# Patient Record
Sex: Female | Born: 1955 | Race: White | Hispanic: No | Marital: Married | Smoking: Current every day smoker
Health system: Southern US, Community
[De-identification: ages and names within clinical notes are randomized; demographics above are authoritative.]

## PROBLEM LIST (undated history)

## (undated) DIAGNOSIS — C801 Malignant (primary) neoplasm, unspecified: Secondary | ICD-10-CM

## (undated) DIAGNOSIS — E785 Hyperlipidemia, unspecified: Secondary | ICD-10-CM

## (undated) DIAGNOSIS — B019 Varicella without complication: Secondary | ICD-10-CM

## (undated) DIAGNOSIS — E1169 Type 2 diabetes mellitus with other specified complication: Secondary | ICD-10-CM

## (undated) DIAGNOSIS — T4145XA Adverse effect of unspecified anesthetic, initial encounter: Secondary | ICD-10-CM

## (undated) DIAGNOSIS — T7840XA Allergy, unspecified, initial encounter: Secondary | ICD-10-CM

## (undated) DIAGNOSIS — R51 Headache: Secondary | ICD-10-CM

## (undated) DIAGNOSIS — Z720 Tobacco use: Secondary | ICD-10-CM

## (undated) DIAGNOSIS — T8859XA Other complications of anesthesia, initial encounter: Secondary | ICD-10-CM

## (undated) DIAGNOSIS — R519 Headache, unspecified: Secondary | ICD-10-CM

## (undated) DIAGNOSIS — M1990 Unspecified osteoarthritis, unspecified site: Secondary | ICD-10-CM

## (undated) DIAGNOSIS — N809 Endometriosis, unspecified: Secondary | ICD-10-CM

## (undated) DIAGNOSIS — R600 Localized edema: Secondary | ICD-10-CM

## (undated) DIAGNOSIS — F32A Depression, unspecified: Secondary | ICD-10-CM

## (undated) DIAGNOSIS — F329 Major depressive disorder, single episode, unspecified: Secondary | ICD-10-CM

## (undated) DIAGNOSIS — E119 Type 2 diabetes mellitus without complications: Secondary | ICD-10-CM

## (undated) HISTORY — PX: DIAGNOSTIC LAPAROSCOPY: SUR761

## (undated) HISTORY — PX: MOHS SURGERY: SUR867

## (undated) HISTORY — DX: Type 2 diabetes mellitus without complications: E11.9

## (undated) HISTORY — DX: Varicella without complication: B01.9

## (undated) HISTORY — DX: Depression, unspecified: F32.A

## (undated) HISTORY — PX: CYSTOSCOPY: SUR368

## (undated) HISTORY — DX: Tobacco use: Z72.0

## (undated) HISTORY — DX: Type 2 diabetes mellitus with other specified complication: E78.5

## (undated) HISTORY — DX: Allergy, unspecified, initial encounter: T78.40XA

## (undated) HISTORY — DX: Major depressive disorder, single episode, unspecified: F32.9

## (undated) HISTORY — DX: Unspecified osteoarthritis, unspecified site: M19.90

## (undated) HISTORY — PX: ABDOMINAL HYSTERECTOMY: SHX81

## (undated) HISTORY — PX: COSMETIC SURGERY: SHX468

## (undated) HISTORY — DX: Hyperlipidemia, unspecified: E11.69

---

## 1989-03-12 DIAGNOSIS — N369 Urethral disorder, unspecified: Secondary | ICD-10-CM | POA: Insufficient documentation

## 1989-03-12 DIAGNOSIS — Z87448 Personal history of other diseases of urinary system: Secondary | ICD-10-CM | POA: Insufficient documentation

## 1991-09-21 DIAGNOSIS — N3010 Interstitial cystitis (chronic) without hematuria: Secondary | ICD-10-CM | POA: Insufficient documentation

## 2005-12-03 ENCOUNTER — Ambulatory Visit: Payer: Self-pay | Admitting: Unknown Physician Specialty

## 2010-11-23 ENCOUNTER — Ambulatory Visit: Payer: Self-pay | Admitting: Family Medicine

## 2011-08-30 DIAGNOSIS — F321 Major depressive disorder, single episode, moderate: Secondary | ICD-10-CM | POA: Insufficient documentation

## 2011-08-30 DIAGNOSIS — E876 Hypokalemia: Secondary | ICD-10-CM | POA: Insufficient documentation

## 2011-08-30 DIAGNOSIS — R609 Edema, unspecified: Secondary | ICD-10-CM | POA: Insufficient documentation

## 2011-08-30 DIAGNOSIS — F32A Depression, unspecified: Secondary | ICD-10-CM | POA: Insufficient documentation

## 2011-10-11 ENCOUNTER — Ambulatory Visit: Payer: Self-pay | Admitting: Family Medicine

## 2011-10-17 ENCOUNTER — Ambulatory Visit: Payer: Self-pay | Admitting: Family Medicine

## 2012-10-09 DIAGNOSIS — E119 Type 2 diabetes mellitus without complications: Secondary | ICD-10-CM | POA: Insufficient documentation

## 2014-02-15 ENCOUNTER — Ambulatory Visit: Payer: Self-pay | Admitting: Internal Medicine

## 2014-03-18 ENCOUNTER — Ambulatory Visit: Payer: Self-pay | Admitting: Internal Medicine

## 2014-04-29 ENCOUNTER — Ambulatory Visit: Payer: Self-pay | Admitting: Internal Medicine

## 2014-06-17 ENCOUNTER — Encounter: Payer: Self-pay | Admitting: Internal Medicine

## 2014-06-17 ENCOUNTER — Ambulatory Visit (INDEPENDENT_AMBULATORY_CARE_PROVIDER_SITE_OTHER): Payer: BC Managed Care – PPO | Admitting: Internal Medicine

## 2014-06-17 ENCOUNTER — Encounter (INDEPENDENT_AMBULATORY_CARE_PROVIDER_SITE_OTHER): Payer: Self-pay

## 2014-06-17 VITALS — BP 128/86 | HR 96 | Temp 98.2°F | Resp 16 | Ht 61.0 in | Wt 160.5 lb

## 2014-06-17 DIAGNOSIS — R110 Nausea: Secondary | ICD-10-CM

## 2014-06-17 DIAGNOSIS — G4726 Circadian rhythm sleep disorder, shift work type: Secondary | ICD-10-CM

## 2014-06-17 DIAGNOSIS — E119 Type 2 diabetes mellitus without complications: Secondary | ICD-10-CM

## 2014-06-17 DIAGNOSIS — R5383 Other fatigue: Secondary | ICD-10-CM

## 2014-06-17 DIAGNOSIS — R1013 Epigastric pain: Secondary | ICD-10-CM

## 2014-06-17 LAB — COMPREHENSIVE METABOLIC PANEL
ALT: 21 U/L (ref 0–35)
AST: 21 U/L (ref 0–37)
Albumin: 4.1 g/dL (ref 3.5–5.2)
Alkaline Phosphatase: 118 U/L — ABNORMAL HIGH (ref 39–117)
BUN: 13 mg/dL (ref 6–23)
CO2: 31 mEq/L (ref 19–32)
Calcium: 10.1 mg/dL (ref 8.4–10.5)
Chloride: 87 mEq/L — ABNORMAL LOW (ref 96–112)
Creatinine, Ser: 0.9 mg/dL (ref 0.4–1.2)
GFR: 67.96 mL/min (ref >60.00)
Glucose, Bld: 93 mg/dL (ref 70–99)
Potassium: 3.1 mEq/L — ABNORMAL LOW (ref 3.5–5.1)
Sodium: 131 mEq/L — ABNORMAL LOW (ref 135–145)
Total Bilirubin: 0.9 mg/dL (ref 0.2–1.2)
Total Protein: 8.3 g/dL (ref 6.0–8.3)

## 2014-06-17 LAB — LIPID PANEL
Cholesterol: 277 mg/dL — ABNORMAL HIGH (ref 0–200)
HDL: 42 mg/dL (ref >39.00)
LDL Cholesterol: 199 mg/dL — ABNORMAL HIGH (ref 0–99)
NonHDL: 235
Total CHOL/HDL Ratio: 7
Triglycerides: 181 mg/dL — ABNORMAL HIGH (ref 0.0–149.0)
VLDL: 36.2 mg/dL (ref 0.0–40.0)

## 2014-06-17 LAB — MICROALBUMIN / CREATININE URINE RATIO
Creatinine,U: 12 mg/dL
Microalb Creat Ratio: 5.8 mg/g (ref 0.0–30.0)
Microalb, Ur: 0.7 mg/dL (ref 0.0–1.9)

## 2014-06-17 LAB — H. PYLORI ANTIBODY, IGG: H Pylori IgG: NEGATIVE

## 2014-06-17 LAB — LIPASE: Lipase: 93 U/L — ABNORMAL HIGH (ref 11.0–59.0)

## 2014-06-17 LAB — TSH: TSH: 1.54 u[IU]/mL (ref 0.35–4.50)

## 2014-06-17 LAB — HEMOGLOBIN A1C: Hgb A1c MFr Bld: 6.1 % (ref 4.6–6.5)

## 2014-06-17 MED ORDER — GLIPIZIDE 5 MG PO TABS: 5.0000 mg | ORAL_TABLET | Freq: Two times a day (BID) | ORAL | Status: DC

## 2014-06-17 MED ORDER — ONDANSETRON 4 MG PO TBDP: 4.0000 mg | ORAL_TABLET | Freq: Three times a day (TID) | ORAL | Status: DC | PRN

## 2014-06-17 MED ORDER — PROMETHAZINE HCL 25 MG/ML IJ SOLN
25.0000 mg | Freq: Once | INTRAMUSCULAR | Status: AC
  Administered 2014-06-17: 25 mg via INTRAMUSCULAR

## 2014-06-17 NOTE — Patient Instructions (Addendum)
Stop the Invokkana Met and the [employer].  DO NOT START GLIPIZIDE UNLESS YOUR BLOOD SUGAR S ARE > 150  This is  my version of a  "Low GI"  Diet:     All of the foods can be found at grocery stores and in bulk at [employer].  The Atkins protein bars and shakes are available in more varieties at Target, [employer] and [place].     7 AM Breakfast:  Choose from the following:  Low carbohydrate Protein  Shakes (I recommend the EAS AdvantEdge "Carb Control" shakes  Or the low carb shakes by Atkins.    2.5 carbs   Arnold's "Sandwhich Thin"toasted  w/ peanut butter (no jelly: about 20 net carbs  "Bagel Thin" with cream cheese and salmon: about 20 carbs   a scrambled egg/bacon/cheese burrito made with Mission's "carb balance" whole wheat tortilla  (about 10 net carbs )  A slice of home made fritatta (egg based dish without a crust:  google it)    Avoid cereal and bananas, oatmeal and cream of wheat and grits. They are loaded with carbohydrates!   10 AM: high protein snack  Protein bar by Atkins (the snack size, under 200 cal, usually < 6 net carbs).    A stick of cheese:  Around 1 carb,  100 cal     Dannon Light n Fit [place] Yogurt  (80 cal, 8 carbs)  Other so called "protein bars" and Greek yogurts tend to be loaded with carbohydrates.  Remember, in food advertising, the word "energy" is synonymous for " carbohydrate."  Lunch:   A Sandwich using the bread choices listed, Can use any  Eggs,  lunchmeat, grilled meat or canned tuna), avocado, regular mayo/mustard  and cheese.  A Salad using blue cheese, ranch,  Goddess or vinagrette,  No croutons or "confetti" and no "candied nuts" but regular nuts OK.   No pretzels or chips.  Pickles and miniature sweet peppers are a good low carb alternative that provide a "crunch"  The bread is the only source of carbohydrate in a sandwich and  can be decreased by trying some of these alternatives to traditional loaf bread  Joseph's makes a pita bread and a flat  bread that are 50 cal and 4 net carbs available at [place] and [place].  This can be toasted to use with hummous as well  Toufayan makes a low carb flatbread that's 100 cal and 9 net carbs available at [employer] and BJ's makes 2 sizes of  Low carb whole wheat tortilla  (The large one is 210 cal and 6 net carbs)  Flat Out makes flatbreads that are low carb as well  Avoid "Low fat dressings, as well as Barry Brunner and [place] dressings They are loaded with sugar!   3 PM/ Mid day  Snack:  Consider  1 ounce of  almonds, walnuts, pistachios, pecans, peanuts,  Macadamia nuts or a nut medley.  Avoid "granola"; the dried cranberries and raisins are loaded with carbohydrates. Mixed nuts as long as there are no raisins,  cranberries or dried fruit.    Try the prosciutto/mozzarella cheese sticks by Fiorruci  In deli /backery section   High protein   To avoid overindulging in snacks: Try drinking a glass of unsweeted almond/coconut milk  Or a cup of coffee with your Atkins chocolate bar to keep you from having 3!!!   Pork rinds!  Yes Pork Rinds  6 PM  Dinner:     Meat/fowl/fish with a green salad, and either broccoli, cauliflower, green beans, spinach, brussel sprouts or  Lima beans. DO NOT BREAD THE PROTEIN!!      There is a low carb pasta by Dreamfield's that is acceptable and tastes great: only 5 digestible carbs/serving.( All grocery stores but BJs carry it )  Try Hurley Cisco Angelo's chicken piccata or chicken or eggplant parm over low carb pasta.(Lowes and BJs)   Marjory Lies Sanchez's "Carnitas" (pulled pork, no sauce,  0 carbs) or his beef pot roast to make a dinner burrito (at BJ's)  Pesto over low carb pasta (bj's sells a good quality pesto in the center refrigerated section of the deli   Try satueeing  Cheral Marker with mushroooms  Whole wheat pasta is still full of digestible carbs and  Not as low in glycemic index as Dreamfield's.   Brown rice is still rice,  So skip the rice and noodles  if you eat [place] or [place] (or at least limit to 1/2 cup)  9 PM snack :   Breyer's "low carb" fudgsicle or  ice cream bar (Carb Smart line), or  Weight Watcher's ice cream bar , or another "no sugar added" ice cream;  a serving of fresh berries/cherries with whipped cream   Cheese or DANNON'S LlGHT N FIT GREEK YOGURT or the [place]os greek yogurt   8 ounces of [place] unsweetened almond/cococunut milk  Cheese and crackers (using WASA crackers,  They are low carb) or peanut butter on low carb crackers or pita bread     Avoid bananas, pineapple, grapes  and watermelon on a regular basis because they are high in sugar.  THINK OF THEM AS DESSERT  Remember that snack Substitutions should be less than 10 NET carbs per serving and meals should be < 25 net carbs. Remember that carbohydrates from fiber do not affect blood sugar, so you can  subtract fiber grams to get the "net carbs " of any particular food item.

## 2014-06-17 NOTE — Progress Notes (Signed)
Patient ID: Kristie Jones, female   DOB: July 05, 1956, 58 y.o.   MRN: [id number]   Patient Active Problem List   Diagnosis Date Noted  . Diabetes mellitus type 2, controlled 06/20/2014  . Nausea without vomiting 06/20/2014  . Sleep disorder, circadian, shift work type 06/20/2014    Subjective:  CC:   Chief Complaint  Patient presents with  . Establish Care  . Diabetes    HPI:   Kristie Jones a 58 y.o. female who presents to establish primary care and to address several issues.   New onset diabetes with significant weight loss, unintentional, coinciding with dx and medicatio nchanges   Diagnosed  with a1c of 7.0  Random  cbg of 156.     198 lbs at diagnosis.  Has Cut out refined sugar ,  Along with most starches .  Started havig "reactions"  To artificial sweeteners:  Vomiting .  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  Previous MD was appealing for a change in work for her , has been unable to have job changed at [employer] so she is actively looking for another job  .   Mon Tues Wed:  Work 6:30 PM to 7 Am   First break 15 min 10 PM Has 62  Min break at 12:30,  3rd break 4 am   Home by 7:30 am watches grandkids until 4:30 Pm has a 3 hour nap   Registered sleep technologist   Takes 2 diuretics and lisinopril , lasix prescribed by nephrology,  Has not been seen in a year.   Cystoscopy done to evaluate hematuria,  No cancer,  LE edema,  Wears compression stockings   History of scarring on bladder,  Endometriosis      Past Medical History  Diagnosis Date  . Arthritis   . Chicken pox   . Depression   . Diabetes mellitus without complication   . Allergy        @ALL @  Past Surgical History  Procedure Laterality Date  . Abdominal hysterectomy       History   Social History  . Marital Status: Married    Spouse Name: N/A    Number of Children: N/A  . Years of Education: N/A   Occupational History  . Not on file.   Social History Main Topics  . Smoking status: Current Every Day Smoker -- 0.50 packs/day    Types: Cigarettes  . Smokeless tobacco: Not on file  . Alcohol Use: 0.0 oz/week    0 Not specified per week     Comment: Rarely  . Drug Use: No  . Sexual Activity: Yes   Other Topics Concern  . Not on file   Social History Narrative  . No narrative on file    @FAMH @       Review of Systems:   The rest of the review of systems was negative except those addressed in the HPI.      Objective:  BP 128/86 mmHg  Pulse 96  Temp(Src) 98.2 F (36.8 C) (Oral)  Resp 16  Ht 5' 1"  (1.549 m)  Wt 160 lb 8 oz (72.802 kg)  BMI 30.34 kg/m2  SpO2 97%  General appearance: alert, cooperative and  appears stated age Ears: normal TM's and external ear canals both ears Throat: lips, mucosa, and tongue normal; teeth and gums normal Neck: no adenopathy, no carotid bruit, supple, symmetrical, trachea midline and thyroid not enlarged, symmetric, no tenderness/mass/nodules Back: symmetric, no curvature. ROM normal. No CVA tenderness. Lungs: clear to auscultation bilaterally Heart: regular rate and rhythm, S1, S2 normal, no murmur, click, rub or gallop Abdomen: soft, non-tender; bowel sounds normal; no masses,  no organomegaly Pulses: 2+ and symmetric Skin: Skin color, texture, turgor normal. No rashes or lesions Lymph nodes: Cervical, supraclavicular, and axillary nodes normal.  Assessment and Plan:  Diabetes mellitus type 2, controlled well-controlled on current medications.  hemoglobin AC  Is significantly  less than 7.0 . Patient is not  up-to-date on eye exams and foot exam is normal today. Patient  Has no urine microalbuminuria . Patient is tolerating statin therapy for CAD risk reduction and on ACE/ARB for  reduction in proteinuria.  Stpping Victoza and Invokkan Met due to patient intolerance of both     Lab Results  Component Value Date   HGBA1C 6.1 06/17/2014   Lab Results  Component Value Date   MICROALBUR 0.7 06/17/2014     Nausea without vomiting Presumed to be medications related.   victoza and invokannamet   Sleep disorder, circadian, shift work type She is desparately looking for an alternative woek situation,  Supported by her former Engineer, drilling.   A total of 60 minutes was spent with patient more than half of which was spent in counseling patient on the above mentioned issues , reviewing and explaining recent labs and imaging studies done, and coordination of care.  Updated Medication List Outpatient Encounter Prescriptions as of 06/17/2014  Medication Sig  . Canagliflozin-Metformin HCl [phone] MG TABS Take 1 tablet by mouth 2 (two) times daily.  . diazepam (VALIUM) 10 MG tablet Take 1 tablet by mouth 3 (three) times daily as needed.  . furosemide (LASIX) 20 MG tablet Take 1 tablet by mouth daily.  Marland Kitchen glipiZIDE (GLUCOTROL) 5 MG tablet Take 1 tablet (5 mg total) by mouth 2 (two) times daily before a meal.  . glucose blood (CVS BLOOD GLUCOSE TEST STRIPS) test strip Check fasting blood sugar daily ONE TOUCH ULTRA STRP BLUE  . Lancet Devices (CVS LANCING DEVICE) MISC Check blood sugar once daily.  250.00  . modafinil (PROVIGIL) 200 MG tablet Take 1.5 tablets by mouth daily.  . ondansetron (ZOFRAN-ODT) 4 MG disintegrating tablet Take 1 tablet (4 mg total) by mouth every 8 (eight) hours as needed for nausea or vomiting.  Marland Kitchen spironolactone (ALDACTONE) 100 MG tablet Take 1 tablet by mouth daily.  . [EXPIRED] promethazine (PHENERGAN) injection 25 mg      Orders Placed This Encounter  Procedures  . Hemoglobin A1c  . Lipid panel  . Microalbumin / creatinine urine ratio  . Comprehensive metabolic panel  . TSH  . Hepatitis C antibody  . Lipase  . H. pylori antibody, IgG  .  Ambulatory referral to Ophthalmology    No Follow-up on file.

## 2014-06-18 ENCOUNTER — Telehealth: Payer: Self-pay | Admitting: Internal Medicine

## 2014-06-18 LAB — HEPATITIS C ANTIBODY: HCV Ab: NEGATIVE

## 2014-06-18 NOTE — Telephone Encounter (Signed)
Patient called and ask to please dis- regard comments made by previous MD on medical record stated they are not true.  Patient did not relay as to the specific comments. Patient also ask for name of web site for support stockings.

## 2014-06-18 NOTE — Telephone Encounter (Signed)
No worries  I may my own conclusions,    [email or web address] in the website

## 2014-06-18 NOTE — Telephone Encounter (Signed)
Patient notified

## 2014-06-20 DIAGNOSIS — E1140 Type 2 diabetes mellitus with diabetic neuropathy, unspecified: Secondary | ICD-10-CM | POA: Insufficient documentation

## 2014-06-20 DIAGNOSIS — R110 Nausea: Secondary | ICD-10-CM | POA: Insufficient documentation

## 2014-06-20 DIAGNOSIS — G4726 Circadian rhythm sleep disorder, shift work type: Secondary | ICD-10-CM | POA: Insufficient documentation

## 2014-06-20 DIAGNOSIS — E119 Type 2 diabetes mellitus without complications: Secondary | ICD-10-CM | POA: Insufficient documentation

## 2014-06-20 NOTE — Assessment & Plan Note (Addendum)
well-controlled on current medications.  hemoglobin AC  Is significantly  less than 7.0 . Patient is not  up-to-date on eye exams and foot exam is normal today. Patient  Has no urine microalbuminuria . Patient is tolerating statin therapy for CAD risk reduction and on ACE/ARB for reduction in proteinuria.  Stpping Victoza and Invokkan Met due to patient intolerance of both     Lab Results  Component Value Date   HGBA1C 6.1 06/17/2014   Lab Results  Component Value Date   MICROALBUR 0.7 06/17/2014

## 2014-06-20 NOTE — Assessment & Plan Note (Signed)
She is desparately looking for an alternative woek situation,  Supported by her former physician.

## 2014-06-20 NOTE — Assessment & Plan Note (Signed)
Presumed to be medications related.   victoza and invokannamet

## 2014-06-23 ENCOUNTER — Telehealth: Payer: Self-pay | Admitting: Internal Medicine

## 2014-06-23 NOTE — Telephone Encounter (Signed)
emmi mailed  °

## 2014-09-16 ENCOUNTER — Encounter: Payer: Self-pay | Admitting: Internal Medicine

## 2014-09-16 ENCOUNTER — Encounter (INDEPENDENT_AMBULATORY_CARE_PROVIDER_SITE_OTHER): Payer: Self-pay

## 2014-09-16 ENCOUNTER — Ambulatory Visit (INDEPENDENT_AMBULATORY_CARE_PROVIDER_SITE_OTHER): Payer: BLUE CROSS/BLUE SHIELD | Admitting: Internal Medicine

## 2014-09-16 VITALS — BP 118/68 | HR 83 | Temp 97.8°F | Resp 14 | Ht 61.0 in | Wt 164.2 lb

## 2014-09-16 DIAGNOSIS — M25511 Pain in right shoulder: Secondary | ICD-10-CM

## 2014-09-16 DIAGNOSIS — E669 Obesity, unspecified: Secondary | ICD-10-CM

## 2014-09-16 DIAGNOSIS — E119 Type 2 diabetes mellitus without complications: Secondary | ICD-10-CM

## 2014-09-16 DIAGNOSIS — M25519 Pain in unspecified shoulder: Secondary | ICD-10-CM | POA: Insufficient documentation

## 2014-09-16 MED ORDER — METAXALONE 800 MG PO TABS: 800.0000 mg | ORAL_TABLET | Freq: Three times a day (TID) | ORAL | Status: DC

## 2014-09-16 NOTE — Progress Notes (Signed)
Pre-visit discussion using our clinic review tool. No additional management support is needed unless otherwise documented below in the visit note.  

## 2014-09-16 NOTE — Progress Notes (Signed)
Patient ID: Kristie Jones, female   DOB: 11/21/1955, 59 y.o.   MRN: [id number]    Patient Active Problem List   Diagnosis Date Noted  . Obesity 09/18/2014  . Pain in joint, shoulder region 09/16/2014  . Diabetes mellitus type 2, controlled 06/20/2014  . Nausea without vomiting 06/20/2014  . Sleep disorder, circadian, shift work type 06/20/2014    Subjective:  CC:   Chief Complaint  Patient presents with  . Follow-up    right wrist pain radiates up arm and into shoulder to neck. Patient rates pain 10 on scale oof 0-10.Patient staed she has had a couple of fall on the right side.  . Diabetes    HPI:   Kristie Jones is a 59 y.o. female who presents for   Follow up on multiple issues,  Including Type 2 DM, shift work related sleep disorder,  Hypertension and obesity.    Cc: right arm pain.  She reports that her right arm has been throbbing for several months and is aggravated by rain and by use of the arm.  The pain has radiated from shoulder to elbow.  currently her pain involves the wrist and radiates all the way up to the neck,  Throbbing in the upper arm.  NO NUMBNESS OR TINGLING.   using biofreeze which helps somewhat .  NSAIDS/ CONTRAINIDICATED DUE TO CKD AND EDEMA .  The arm started  Hurting in November after a period of increased physical activity secondary to caring for ailing mother .  Patient has no recent history of trauma  but She has had several falls , her most recent in 2013. She has a history of fibromyalgia diagnosed in 2007 at [facility].  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  All passive ROM hurts,  And the upper arm is painful to palpation.  Patient appears to be in moderate pain today.     Past Medical History  Diagnosis Date  . Arthritis   . Chicken pox   . Depression   . Diabetes mellitus without complication   . Allergy     Past Surgical History  Procedure Laterality Date  . Abdominal  hysterectomy         The following portions of the patient's history were reviewed and updated as appropriate: Allergies, current medications, and problem list.    Review of Systems:   Patient denies headache, fevers, malaise, unintentional weight loss, skin rash, eye pain, sinus congestion and sinus pain, sore throat, dysphagia,  hemoptysis , cough, dyspnea, wheezing, chest pain, palpitations, orthopnea, edema, abdominal pain, nausea, melena, diarrhea, constipation, flank pain, dysuria, hematuria, urinary  Frequency, nocturia, numbness, tingling, seizures,  Focal weakness, Loss of consciousness,  Tremor, insomnia, depression, anxiety, and suicidal ideation.     History   Social History  . Marital Status: Married    Spouse Name: N/A  . Number of Children: N/A  . Years of Education: N/A   Occupational History  . Not on file.   Social History Main Topics  . Smoking status: Current Every Day Smoker -- 0.50 packs/day    Types: Cigarettes  . Smokeless tobacco: Not on file  . Alcohol Use: 0.0 oz/week    0 Standard drinks or equivalent per week     Comment: Rarely  . Drug Use: No  . Sexual Activity: Yes   Other Topics Concern  . Not on file   Social  History Narrative    Objective:  Filed Vitals:   09/16/14 1132  BP: 118/68  Pulse: 83  Temp: 97.8 F (36.6 C)  Resp: 14     General appearance: alert, cooperative and appears stated age Back: symmetric, no curvature. ROM normal. No CVA tenderness. Lungs: clear to auscultation bilaterally Heart: regular rate and rhythm, S1, S2 normal, no murmur, click, rub or gallop Abdomen: soft, non-tender; bowel sounds normal; no masses,  no organomegaly Pulses: 2+ and symmetric Skin: Skin color, texture, turgor normal. No rashes or lesions Lymph nodes: Cervical, supraclavicular, and axillary nodes normal. MSKL right shoulder is painful with all passive ROM.  Deltoid bicep and triceps MM are tender to palpation. Distal strength is  4/5 secondary to increased pain with grip and resistance.   Assessment and Plan:  Pain in joint, shoulder region Her pain is diffuse and spans all ROM , most prounounced in upper arm and shoulder but also involves the wrist .  No neck involvement and nothing on exam to suggest CTS.  Adhesive capsulitis vs bursitis of shoulder suspected.  She refuses prednisone taper and has a C/I to NSAIDs due to CKD.  Muscle relaxer  Ordered, and referral to [facility]    Diabetes mellitus type 2, controlled Historically well-controlled on current medications.  hemoglobin AC  Is significantly  less than 7.0 . Patient is not  up-to-date on eye exams and foot exam is normal today. Patient  Has no urine microalbuminuria . Patient is tolerating statin therapy for CAD risk reduction and on ACE/ARB for reduction in proteinuria.  At her last visit all medications including  Victoza and Invokkana Met were stopped due to patient intolerance of both .  She will return next week for A1c   Lab Results  Component Value Date   HGBA1C 6.1 06/17/2014   Lab Results  Component Value Date   MICROALBUR 0.7 06/17/2014        Obesity I have addressed  BMI and recommended wt loss of 10% of body weigh over the next 6 months using a low glycemic index diet and regular exercise a minimum of 5 days per week.      Updated Medication List Outpatient Encounter Prescriptions as of 09/16/2014  Medication Sig  . diazepam (VALIUM) 10 MG tablet Take 1 tablet by mouth 3 (three) times daily as needed.  . furosemide (LASIX) 20 MG tablet Take 1 tablet by mouth daily.  Marland Kitchen glipiZIDE (GLUCOTROL) 5 MG tablet Take 1 tablet (5 mg total) by mouth 2 (two) times daily before a meal.  . glucose blood (CVS BLOOD GLUCOSE TEST STRIPS) test strip Check fasting blood sugar daily ONE TOUCH ULTRA STRP BLUE  . Lancet Devices (CVS LANCING DEVICE) MISC Check blood sugar once daily.  250.00  . Menthol, Topical Analgesic, (BIOFREEZE EX) Apply 1  application topically every 4 (four) hours as needed.  . modafinil (PROVIGIL) 200 MG tablet Take 1.5 tablets by mouth daily.  . Multiple Vitamins-Minerals (CENTRUM SILVER ADULT 50+ PO) Take 1 tablet by mouth daily.  Marland Kitchen spironolactone (ALDACTONE) 100 MG tablet Take 1 tablet by mouth daily.  . Canagliflozin-Metformin HCl [phone] MG TABS Take 1 tablet by mouth 2 (two) times daily.  . metaxalone (SKELAXIN) 800 MG tablet Take 1 tablet (800 mg total) by mouth 3 (three) times daily.  . ondansetron (ZOFRAN-ODT) 4 MG disintegrating tablet Take 1 tablet (4 mg total) by mouth every 8 (eight) hours as needed for nausea or vomiting. (Patient not taking: Reported on  09/16/2014)     Orders Placed This Encounter  Procedures  . Ambulatory referral to Orthopedic Surgery    No Follow-up on file.

## 2014-09-16 NOTE — Patient Instructions (Signed)
I have prescribed skelaxin for your arm pain and referred you to Dr Cindi Carbon for probable adhesive capsulitis  Return on or after Feb 12 for your fasting labs.

## 2014-09-18 ENCOUNTER — Encounter: Payer: Self-pay | Admitting: Internal Medicine

## 2014-09-18 DIAGNOSIS — E669 Obesity, unspecified: Secondary | ICD-10-CM | POA: Insufficient documentation

## 2014-09-18 NOTE — Assessment & Plan Note (Signed)
I have addressed  BMI and recommended wt loss of 10% of body weigh over the next 6 months using a low glycemic index diet and regular exercise a minimum of 5 days per week.   

## 2014-09-18 NOTE — Assessment & Plan Note (Addendum)
Historically well-controlled on current medications.  hemoglobin AC  Is significantly  less than 7.0 . Patient is not  up-to-date on eye exams and foot exam is normal today. Patient  Has no urine microalbuminuria . Patient is tolerating statin therapy for CAD risk reduction and on ACE/ARB for reduction in proteinuria.  At her last visit all medications including  Victoza and Invokkana Met were stopped due to patient intolerance of both .  She will return next week for A1c   Lab Results  Component Value Date   HGBA1C 6.1 06/17/2014   Lab Results  Component Value Date   MICROALBUR 0.7 06/17/2014      

## 2014-09-18 NOTE — Assessment & Plan Note (Addendum)
Her pain is diffuse and spans all ROM , most prounounced in upper arm and shoulder but also involves the wrist .  No neck involvement and nothing on exam to suggest CTS.  Adhesive capsulitis vs bursitis of shoulder suspected.  She refuses prednisone taper and has a C/I to NSAIDs due to CKD.  Muscle relaxer  Ordered, and referral to [employer] 

## 2014-09-18 NOTE — Addendum Note (Signed)
Addended by: Crecencio Mc on: 09/18/2014 07:40 PM   Modules accepted: Orders

## 2014-12-30 ENCOUNTER — Telehealth: Payer: Self-pay

## 2014-12-30 NOTE — Telephone Encounter (Signed)
The pt called hoping to discuss her depressions symptoms with Dr.[name] or Juliann Pulse.  Callback - [phone]

## 2014-12-31 ENCOUNTER — Other Ambulatory Visit: Payer: Self-pay | Admitting: Internal Medicine

## 2014-12-31 ENCOUNTER — Telehealth: Payer: Self-pay | Admitting: Internal Medicine

## 2014-12-31 MED ORDER — DIAZEPAM 10 MG PO TABS: 10.0000 mg | ORAL_TABLET | Freq: Every day | ORAL | Status: DC

## 2014-12-31 NOTE — Telephone Encounter (Signed)
Called patient family member answered phone and stated patient in shower could not come to phone.

## 2014-12-31 NOTE — Telephone Encounter (Signed)
Left message for patient to return call to office. 

## 2014-12-31 NOTE — Telephone Encounter (Signed)
See note on 12/31/14

## 2014-12-31 NOTE — Telephone Encounter (Signed)
Pt called to talk to [facility]. I then asked her for information so I could leave a note. Pt hung up in frustration, after talking about phone options and why she couldn't choose to talk to [facility]. I was not able to verify a contact number for call back.  12/31/14 maf

## 2014-12-31 NOTE — Telephone Encounter (Signed)
Kristie Jones, can you please try to call her back to talk to her.  Thanks

## 2014-12-31 NOTE — Telephone Encounter (Signed)
Refill faxed and patient is scheduled for Monday 01/10/15 due to patient will be out of town next week.

## 2014-12-31 NOTE — Telephone Encounter (Signed)
Refill on valium once daily approved and signed please offer patient an appt with me next week

## 2014-12-31 NOTE — Telephone Encounter (Signed)
Pt has called back wishing to talk to [facility]
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
## 2014-12-31 NOTE — Progress Notes (Signed)
Script faxed patient notified. 

## 2014-12-31 NOTE — Telephone Encounter (Signed)
Thanks, I will follow up with [facility].

## 2015-01-10 ENCOUNTER — Ambulatory Visit (INDEPENDENT_AMBULATORY_CARE_PROVIDER_SITE_OTHER): Payer: BLUE CROSS/BLUE SHIELD | Admitting: Internal Medicine

## 2015-01-10 ENCOUNTER — Encounter (INDEPENDENT_AMBULATORY_CARE_PROVIDER_SITE_OTHER): Payer: Self-pay

## 2015-01-10 VITALS — BP 130/84 | HR 98 | Temp 97.9°F | Resp 16 | Ht 61.0 in | Wt 166.0 lb

## 2015-01-10 DIAGNOSIS — E119 Type 2 diabetes mellitus without complications: Secondary | ICD-10-CM

## 2015-01-10 DIAGNOSIS — G4726 Circadian rhythm sleep disorder, shift work type: Secondary | ICD-10-CM | POA: Diagnosis not present

## 2015-01-10 DIAGNOSIS — Z716 Tobacco abuse counseling: Secondary | ICD-10-CM

## 2015-01-10 DIAGNOSIS — F331 Major depressive disorder, recurrent, moderate: Secondary | ICD-10-CM | POA: Insufficient documentation

## 2015-01-10 DIAGNOSIS — R748 Abnormal levels of other serum enzymes: Secondary | ICD-10-CM

## 2015-01-10 DIAGNOSIS — Z720 Tobacco use: Secondary | ICD-10-CM

## 2015-01-10 DIAGNOSIS — F418 Other specified anxiety disorders: Secondary | ICD-10-CM | POA: Diagnosis not present

## 2015-01-10 MED ORDER — ESCITALOPRAM OXALATE 10 MG PO TABS: 10.0000 mg | ORAL_TABLET | Freq: Every day | ORAL | Status: DC

## 2015-01-10 MED ORDER — MODAFINIL 200 MG PO TABS: 300.0000 mg | ORAL_TABLET | Freq: Every day | ORAL | Status: DC
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
## 2015-01-10 NOTE — Progress Notes (Signed)
Pre visit review using our clinic review tool, if applicable. No additional management support is needed unless otherwise documented below in the visit note. 

## 2015-01-10 NOTE — Assessment & Plan Note (Signed)
Needs refill on Provigil,

## 2015-01-10 NOTE — Patient Instructions (Addendum)
We are resuming lexapro, start with 1/2 tablet for a few days  The increase to full tablet.   Stay at 10 mg daily for at least 2 weeks before deciding if you need to go up to 20 mg   Try wearing the Merrell shoes, instead of Dansko's to prevent falls  Take a baby aspirin daily

## 2015-01-10 NOTE — Assessment & Plan Note (Addendum)
Currently managed with glipizide and low glycemic index diet, with labs ordered for follow up.  Lab Results  Component Value Date   HGBA1C 6.1 06/17/2014   Lab Results  Component Value Date   MICROALBUR 0.7 06/17/2014

## 2015-01-11 ENCOUNTER — Encounter: Payer: Self-pay | Admitting: Internal Medicine

## 2015-01-11 DIAGNOSIS — Z716 Tobacco abuse counseling: Secondary | ICD-10-CM | POA: Insufficient documentation

## 2015-01-11 DIAGNOSIS — Z720 Tobacco use: Secondary | ICD-10-CM | POA: Insufficient documentation

## 2015-01-11 LAB — MICROALBUMIN / CREATININE URINE RATIO
Creatinine,U: 18 mg/dL
Microalb Creat Ratio: 6.1 mg/g (ref 0.0–30.0)
Microalb, Ur: 1.1 mg/dL (ref 0.0–1.9)

## 2015-01-11 LAB — COMPREHENSIVE METABOLIC PANEL
ALT: 17 U/L (ref 0–35)
AST: 21 U/L (ref 0–37)
Albumin: 4.6 g/dL (ref 3.5–5.2)
Alkaline Phosphatase: 127 U/L — ABNORMAL HIGH (ref 39–117)
BUN: 19 mg/dL (ref 6–23)
CO2: 32 mEq/L (ref 19–32)
Calcium: 10.1 mg/dL (ref 8.4–10.5)
Chloride: 95 mEq/L — ABNORMAL LOW (ref 96–112)
Creatinine, Ser: 0.97 mg/dL (ref 0.40–1.20)
GFR: 63.01 mL/min (ref >60.00)
Glucose, Bld: 78 mg/dL (ref 70–99)
Potassium: 4.2 mEq/L (ref 3.5–5.1)
Sodium: 137 mEq/L (ref 135–145)
Total Bilirubin: 0.4 mg/dL (ref 0.2–1.2)
Total Protein: 7.6 g/dL (ref 6.0–8.3)

## 2015-01-11 LAB — LIPID PANEL
Cholesterol: 261 mg/dL — ABNORMAL HIGH (ref 0–200)
HDL: 54.2 mg/dL (ref >39.00)
LDL Cholesterol: 168 mg/dL — ABNORMAL HIGH (ref 0–99)
NonHDL: 206.8
Total CHOL/HDL Ratio: 5
Triglycerides: 196 mg/dL — ABNORMAL HIGH (ref 0.0–149.0)
VLDL: 39.2 mg/dL (ref 0.0–40.0)

## 2015-01-11 LAB — HEMOGLOBIN A1C: Hgb A1c MFr Bld: 6 % (ref 4.6–6.5)

## 2015-01-11 NOTE — Addendum Note (Signed)
Addended by: Crecencio Mc on: 01/11/2015 09:27 PM   Modules accepted: Orders

## 2015-01-11 NOTE — Assessment & Plan Note (Signed)
Risks of continued tobacco use were discussed. She is not currently interested in tobacco cessation.    

## 2015-01-11 NOTE — Assessment & Plan Note (Addendum)
Resuming lexapro starting at 5 mg with patient instructed to self titrate up to 20 mg over the next 4 weeks if needed .  Return in 3 months. She is NOT SUICIDA. L Valium refilled. The risks and benefits of benzodiazepine use were discussed with patient today including excessive sedation leading to respiratory depression,  impaired thinking/driving, and addiction.  Patient was advised to avoid concurrent use with alcohol, to use medication only as needed and not to share with others  .

## 2015-01-12 ENCOUNTER — Telehealth: Payer: Self-pay

## 2015-01-12 NOTE — Telephone Encounter (Signed)
Pt called to get lab results, due to her working 3rd shift, she was going to sleep for the day.  I relayed the results that were written in here chart and scheduled her for a repeat lab in several weeks.

## 2015-01-21 ENCOUNTER — Encounter: Payer: Self-pay | Admitting: Internal Medicine

## 2015-01-24 MED ORDER — MODAFINIL 200 MG PO TABS: 300.0000 mg | ORAL_TABLET | Freq: Every day | ORAL | Status: DC

## 2015-01-26 ENCOUNTER — Telehealth: Payer: Self-pay | Admitting: *Deleted

## 2015-01-26 NOTE — Telephone Encounter (Signed)
Fax from [employer], [place] needed for Modafinil. PA questions were sent via fax. Sent mychart for additional question.

## 2015-01-27 ENCOUNTER — Telehealth: Payer: Self-pay | Admitting: Internal Medicine

## 2015-01-27 DIAGNOSIS — Z7689 Persons encountering health services in other specified circumstances: Secondary | ICD-10-CM

## 2015-01-27 NOTE — Telephone Encounter (Signed)
PA for Provigil has been completed, please charge  for PA $29

## 2015-01-27 NOTE — Telephone Encounter (Signed)
Patient PA faxed to pharmacy as requested and copy made for billing and original sent to scan.

## 2015-01-27 NOTE — Telephone Encounter (Signed)
See unrouted message re PA charge

## 2015-01-27 NOTE — Telephone Encounter (Signed)
Given to Dr. Derrel Nip for signature

## 2015-01-27 NOTE — Telephone Encounter (Signed)
PA faxed back by [facility].

## 2015-01-28 NOTE — Telephone Encounter (Signed)
Fax from [employer], PA approved through 01/27/16

## 2015-02-11 ENCOUNTER — Other Ambulatory Visit: Payer: Self-pay | Admitting: Internal Medicine

## 2015-02-17 ENCOUNTER — Other Ambulatory Visit: Payer: BLUE CROSS/BLUE SHIELD

## 2015-02-21 ENCOUNTER — Encounter: Payer: Self-pay | Admitting: Internal Medicine

## 2015-04-14 ENCOUNTER — Ambulatory Visit: Payer: BLUE CROSS/BLUE SHIELD | Admitting: Internal Medicine

## 2015-05-12 ENCOUNTER — Telehealth: Payer: Self-pay | Admitting: *Deleted

## 2015-05-12 NOTE — Telephone Encounter (Signed)
I spoke with patient and due to Dr. Derrel Nip not being in clinic today and per Kenney Houseman advised patient to go to [facility] in to be evaluated. Patient stated that she would go and if she has any other problems to give [place] a call.

## 2015-05-12 NOTE — Telephone Encounter (Signed)
Patient has requested a call back. Patient has a concern about a lump below her ear lobe, above the jaw line. Patient stated that she's afraid because of her previous skin cancer. The spot is tender and hard. Patient would like to see Dr Derrel Nip., however she's having car issues. Patient can be seen in the afternoon, after pm. Please advise were to place patient on schedule

## 2015-05-16 ENCOUNTER — Other Ambulatory Visit: Payer: Self-pay

## 2015-05-16 MED ORDER — CVS LANCING DEVICE MISC: Status: DC

## 2015-05-16 MED ORDER — SPIRONOLACTONE 100 MG PO TABS: 100.0000 mg | ORAL_TABLET | Freq: Every day | ORAL | Status: DC

## 2015-05-16 MED ORDER — FUROSEMIDE 20 MG PO TABS: 20.0000 mg | ORAL_TABLET | Freq: Two times a day (BID) | ORAL | Status: DC

## 2015-05-16 NOTE — Telephone Encounter (Signed)
Enlarged Lymph node DX at [facility] clinic walkin given Clindamycin for 10 day course patient was advised that if ABX did not work she may need a biopsy in the  last month has had 9 Squamus cell carcinoma removed and the lymph node that is swollen is on left side as the SQ cells removed. Patient feels fatigued,  Weak, nodule under ear comes down left side of neck tender to touch and hurts to chew on that side of her mouth.

## 2015-05-16 NOTE — Telephone Encounter (Signed)
Patient has been added to schedule.

## 2015-05-16 NOTE — Telephone Encounter (Signed)
Tomorrow 4:30   Is  fine

## 2015-05-16 NOTE — Telephone Encounter (Signed)
Patient stated she cannot come in for fear of loosing her job until Thursday but the only spot on Thursday would be 11.30.

## 2015-05-16 NOTE — Telephone Encounter (Signed)
Pleasea dd to schedule Thursday at 11.30

## 2015-05-16 NOTE — Telephone Encounter (Signed)
Thursday is fine !  Poor thing!

## 2015-05-16 NOTE — Telephone Encounter (Signed)
Pt called back requesting to speak with Juliann Pulse and Dr Derrel Nip. Pt states that the lump below ear lobe above the jaw line is still bothering her. Pt went to [facility] and still is not better. Pt did not want to sch at this time and wants to speak to [facility]. Thank You!

## 2015-05-19 ENCOUNTER — Telehealth: Payer: Self-pay | Admitting: Internal Medicine

## 2015-05-19 ENCOUNTER — Ambulatory Visit (INDEPENDENT_AMBULATORY_CARE_PROVIDER_SITE_OTHER): Payer: BLUE CROSS/BLUE SHIELD | Admitting: Internal Medicine

## 2015-05-19 ENCOUNTER — Encounter: Payer: Self-pay | Admitting: Internal Medicine

## 2015-05-19 VITALS — BP 126/82 | HR 73 | Temp 98.3°F | Resp 12 | Ht 61.0 in | Wt 165.0 lb

## 2015-05-19 DIAGNOSIS — F331 Major depressive disorder, recurrent, moderate: Secondary | ICD-10-CM

## 2015-05-19 DIAGNOSIS — I889 Nonspecific lymphadenitis, unspecified: Secondary | ICD-10-CM

## 2015-05-19 DIAGNOSIS — D119 Benign neoplasm of major salivary gland, unspecified: Secondary | ICD-10-CM | POA: Insufficient documentation

## 2015-05-19 DIAGNOSIS — R599 Enlarged lymph nodes, unspecified: Secondary | ICD-10-CM | POA: Diagnosis not present

## 2015-05-19 DIAGNOSIS — R591 Generalized enlarged lymph nodes: Secondary | ICD-10-CM

## 2015-05-19 MED ORDER — TRAMADOL HCL 50 MG PO TABS: 50.0000 mg | ORAL_TABLET | Freq: Three times a day (TID) | ORAL | Status: DC | PRN

## 2015-05-19 MED ORDER — PREDNISONE 10 MG PO TABS
ORAL_TABLET | ORAL | Status: DC
Start: 2015-05-19 — End: 2015-06-21

## 2015-05-19 MED ORDER — PREDNISONE 50 MG PO TABS: ORAL_TABLET | ORAL | Status: DC
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
## 2015-05-19 NOTE — Telephone Encounter (Signed)
Patient notified by phone patient ask that a Mychart message also be sent; sent as requested .

## 2015-05-19 NOTE — Telephone Encounter (Signed)
The CT scan is scheduled for Friday at 11 am.  She needs to take the 50 mg prednisone tablets at the following times:  11 PM tonight 6 am tomorrow 10 Am tomorrow  After the procedure she can start the prednisone taper on Saturday

## 2015-05-19 NOTE — Patient Instructions (Addendum)
Finish the clindamycin,  But please add a probiotic daily'  Prednisone taper to help the inflammation and congestion  Resume your decongestant ASAP and add Afrin nasal spray  Referral to Dr Kathyrn Sheriff  I would suck on fresh lemon several times a day, because this could actually be a blocked parotid duct (they can be blocked by a stone)  So I am ordering a CT scan to rule this out    Salivary Stone A salivary stone is a mineral deposit that builds up in the ducts that drain your salivary glands. Most salivary gland stones are made of calcium. When a stone forms, saliva can back up into the gland and cause painful swelling. Your salivary glands are the glands that produce spit (saliva). You have six major salivary glands. Each gland has a duct that carries saliva into your mouth. Saliva keeps your mouth moist and breaks down the food that you eat. It also helps to prevent tooth decay. Two salivary glands are located just in front of your ears (parotid). The ducts for these glands open up inside your cheeks, near your back teeth. You also have two glands under your tongue (sublingual) and two glands under your jaw (submandibular). The ducts for these glands open under your tongue. A stone can form in any salivary gland. The most common place for a salivary stone to develop is in a submandibular salivary gland. CAUSES Any condition that reduces the flow of saliva may lead to stone formation. It is not known why some people form stones and others do not.  RISK FACTORS You may be more likely to develop a salivary stone if you:  Are female.  Do not drink enough water.  Smoke.  Have high blood pressure.  Have gout.  Have diabetes. SIGNS AND SYMPTOMS The main sign of a salivary gland stone is sudden swelling of a salivary gland when eating. This usually happens under the jaw on one side. Other signs and symptoms include:  Swelling of the cheek or under the tongue when eating.  Pain in the  swollen area.  Trouble chewing or swallowing.  Swelling that goes down after eating. DIAGNOSIS Your health care provider may diagnose a salivary gland stone based on your signs and symptoms. The health care provider will also do a physical exam. In many cases, a stone can be felt in a duct inside your mouth. You may need to see an ear, nose, and throat specialist (ENT or otolaryngologist) for diagnosis and treatment. You may also need to have diagnostic tests. These may include imaging studies to check for a stone, such as:  X-rays.  Ultrasound.  CT scan.  MRI. TREATMENT Home care may be enough to treat a small stone that is not causing symptoms. Treatment of a stone that is large enough to cause symptoms may include:  Probing and widening the duct to allow the stone to pass.  Inserting a thin, flexible scope (endoscope) into the duct to locate and remove the stone.  Breaking up the stone with sound waves.  Removing the entire salivary gland. HOME CARE INSTRUCTIONS  Drink enough fluid to keep your urine clear or pale yellow.  Follow these instructions every few hours:  Suck on a lemon candy to stimulate the flow of saliva.  Put a hot compress over the gland.  Gently massage the gland.  Do not use any tobacco products, including cigarettes, chewing tobacco, or electronic cigarettes. If you need help quitting, ask your health care provider. SEEK  MEDICAL CARE IF:  You have pain and swelling in your face, jaw, or mouth after eating.  You have persistent swelling in any of these places:  In front of your ear.  Under your jaw.  Inside your mouth. SEEK IMMEDIATE MEDICAL CARE IF:  You have pain and swelling in your face, jaw, or mouth that are getting worse.  Your pain and swelling make it hard to swallow or breathe.   This information is not intended to replace advice given to you by your health care provider. Make sure you discuss any questions you have with your  health care provider.   Document Released: 08/30/2004 Document Revised: 08/13/2014 Document Reviewed: 12/23/2013 Elsevier Interactive Patient Education [employer].

## 2015-05-19 NOTE — Progress Notes (Signed)
Pre-visit discussion using our clinic review tool. No additional management support is needed unless otherwise documented below in the visit note.  

## 2015-05-19 NOTE — Assessment & Plan Note (Addendum)
Unilateral, left side,  Worse despite 6 days of empiric  treatment with the only oral antibiotic she appears to tolerate (prescribed Cleocin on Oct 7th by Urgent Care).  Not clear if this is parotitis, a sialolithiasis,  S/m lymphadenitis, or a neoplasm, given her long history of tobacco abuse.  .  Will order maxillofacial CT with IV contrast and have her see Margaretha Sheffield in follow up  ,.  Pretreatment for IV contrast allergy (per patient migraine and presyncope, but I would prefer not to take a chance)  And prednisone taper afterward.

## 2015-05-19 NOTE — Progress Notes (Signed)
Subjective:  Patient ID: Kristie Jones, female    DOB: 1955-12-18  Age: 59 y.o. MRN: [id number]  CC: The primary encounter diagnosis was Lymphadenopathy of head and neck. Diagnoses of Submandibular lymphadenitis and Major depressive disorder, recurrent episode, moderate ([place]) were also pertinent to this visit.  HPI [name] presents for persistent pain and swelling at her left submandibular area at the parotid border, under her left ear.  She states that she noticed it 10 days ago. She was treated for cervical LAD with mild leukocytosis (WBC 14.7 with left shift)  But the area has become progressively more painful.  She denies fevers but has been subjectively chilled alternating with feeling hot.  She has been taking tylenol and Excedrin migraine for pain.  She has chronic sinus congestion treated with claritinD but ran out recently.  Denies ear pain and sinus pain.  Had been feeling excessive fatigue which is unusual for the past several weeks.   Lifelong smoker.   had skin cancers removed  From lower extremities on sept 29 , 9 total,  And had teeth cleaned on  Thursday Sept 22.  Patient's demeanor today is one of distress,  Her conversation today is meandering,  Unable to provide objective details without bringing up marital problems .    Outpatient Prescriptions Prior to Visit  Medication Sig Dispense Refill  . furosemide (LASIX) 20 MG tablet Take 1 tablet (20 mg total) by mouth 2 (two) times daily. 30 tablet 5  . glipiZIDE (GLUCOTROL) 5 MG tablet Take 1 tablet (5 mg total) by mouth 2 (two) times daily before a meal. 60 tablet 3  . [employer] ([employer] LANCING DEVICE) MISC Check blood sugar once daily.  250.00 100 each 6  . loratadine-pseudoephedrine (CLARITIN-D 24-HOUR) 10-240 MG per 24 hr tablet Take 1 tablet by mouth daily.    . Menthol, Topical Analgesic, (BIOFREEZE EX) Apply 1 application topically every 4 (four) hours as needed.    . metaxalone (SKELAXIN) 800 MG tablet Take 1  tablet (800 mg total) by mouth 3 (three) times daily. (Patient taking differently: Take 800 mg by mouth 3 (three) times daily as needed. ) 90 tablet 2  . modafinil (PROVIGIL) 200 MG tablet Take 1.5 tablets (300 mg total) by mouth daily. 135 tablet 1  . Multiple Vitamins-Minerals (CENTRUM SILVER ADULT 50+ PO) Take 1 tablet by mouth daily.    . ondansetron (ZOFRAN-ODT) 4 MG disintegrating tablet Take 1 tablet (4 mg total) by mouth every 8 (eight) hours as needed for nausea or vomiting. 30 tablet 1  . spironolactone (ALDACTONE) 100 MG tablet Take 1 tablet (100 mg total) by mouth daily. 30 tablet 5  . diazepam (VALIUM) 10 MG tablet Take 1 tablet (10 mg total) by mouth daily. (Patient taking differently: Take 10 mg by mouth daily as needed. ) 30 tablet 3  . escitalopram (LEXAPRO) 10 MG tablet TAKE 1 TABLET ONCE DAILY (Patient not taking: Reported on 05/19/2015) 30 tablet 2   No facility-administered medications prior to visit.    Review of Systems;  Patient denies fevers, , unintentional weight loss, skin rash, eye pain, sore throat, dysphagia,  hemoptysis , cough, dyspnea, wheezing, chest pain, palpitations, orthopnea, edema, abdominal pain, nausea, melena, diarrhea, constipation, flank pain, dysuria, hematuria, urinary  Frequency, nocturia, numbness, tingling, seizures,  Focal weakness, Loss of consciousness,  Tremor, insomnia, depression, anxiety, and suicidal ideation.      Objective:  BP 126/82 mmHg  Pulse 73  Temp(Src) 98.3 F (36.8  C) (Oral)  Resp 12  Ht 5\' 1"  (1.549 m)  Wt 165 lb (74.844 kg)  BMI 31.19 kg/m2  SpO2 98%  BP Readings from Last 3 Encounters:  05/19/15 126/82  01/10/15 130/84  09/16/14 118/68    Wt Readings from Last 3 Encounters:  05/19/15 165 lb (74.844 kg)  01/10/15 166 lb (75.297 kg)  09/16/14 164 lb 4 oz (74.503 kg)    General appearance: patient in a deep sleep on examining table when I entered room,.  Blanket over head .  I had to shake her to wake her  up.  Throat: lips, mucosa, and tongue normal; teeth and gums normal Neck: left parotid/submandibular area tender swollen.  , no carotid bruit, supple, symmetrical, trachea midline and thyroid not enlarged, symmetric, no tenderness/mass/nodules Left ear:  Injected stapes,  TM clear ,  Not bulging.  Right TM normal.  Lungs: clear to auscultation bilaterally Heart: regular rate and rhythm, S1, S2 normal, no murmur, click, rub or gallop Abdomen: soft, non-tender; bowel sounds normal; no masses,  no organomegaly Pulses: 2+ and symmetric Skin: Skin color, texture, turgor normal. No rashes or lesions Lymph nodes: Cervical, supraclavicular, and axillary nodes normal. Psych: affect flat, makes good eye contact. Appears to be in moderate pain.    Lab Results  Component Value Date   HGBA1C 6.0 01/10/2015   HGBA1C 6.1 06/17/2014    Lab Results  Component Value Date   CREATININE 0.97 01/10/2015   CREATININE 0.9 06/17/2014    Lab Results  Component Value Date   GLUCOSE 78 01/10/2015   CHOL 261* 01/10/2015   TRIG 196.0* 01/10/2015   HDL 54.20 01/10/2015   LDLCALC 168* 01/10/2015   ALT 17 01/10/2015   AST 21 01/10/2015   NA 137 01/10/2015   K 4.2 01/10/2015   CL 95* 01/10/2015   CREATININE 0.97 01/10/2015   BUN 19 01/10/2015   CO2 32 01/10/2015   TSH 1.54 06/17/2014   HGBA1C 6.0 01/10/2015   MICROALBUR 1.1 01/10/2015    No results found.  Assessment & Plan:   Problem List Items Addressed This Visit    Major depressive disorder, recurrent episode, moderate ([place])    Appears to be aggravated by marital conflict.  Patient is no longer taking lexapro.  She understands that today was a :"work in" for facial pain and will return ASAP to discuss resuming SSRI therapy.       Submandibular lymphadenitis    Unilateral, left side,  Worse despite 6 days of empiric  treatment with the only oral antibiotic she appears to tolerate (prescribed Cleocin on Oct 7th by Urgent Care).  Not clear if  this is parotitis, a sialolithiasis,  S/m lymphadenitis, or a neoplasm, given her long history of tobacco abuse.  .  Will order maxillofacial CT with IV contrast and have her see Margaretha Sheffield in follow up  ,.  Pretreatment for IV contrast allergy (per patient migraine and presyncope, but I would prefer not to take a chance)  And prednisone taper afterward.         Other Visit Diagnoses    Lymphadenopathy of head and neck    -  Primary    Relevant Orders    CT Maxillofacial W/Cm    Ambulatory referral to ENT     A total of 40 minutes was spent with patient more than half of which was spent in counseling patient on the above mentioned issues , reviewing and explaining recent labs and imaging studies done,  and coordination of care.  I have discontinued Ms. Winger's diazepam and escitalopram. I am also having her start on predniSONE, traMADol, and predniSONE. Additionally, I am having her maintain her ondansetron, glipiZIDE, Multiple Vitamins-Minerals (CENTRUM SILVER ADULT 50+ PO), (Menthol, Topical Analgesic, (BIOFREEZE EX)), metaxalone, loratadine-pseudoephedrine, modafinil, [employer] LANCING DEVICE, furosemide, spironolactone, and clindamycin.  Meds ordered this encounter  Medications  . clindamycin (CLEOCIN) 300 MG capsule    Sig: Take 300 mg by mouth 3 (three) times daily.   . predniSONE (DELTASONE) 10 MG tablet    Sig: 6 tablets on Day 1 , then reduce by 1 tablet daily until gone    Dispense:  21 tablet    Refill:  0  . traMADol (ULTRAM) 50 MG tablet    Sig: Take 1 tablet (50 mg total) by mouth every 8 (eight) hours as needed.    Dispense:  90 tablet    Refill:  0  . predniSONE (DELTASONE) 50 MG tablet    Sig: 1 tablet 13 hours before the procedure, 2nd dose 5 hours  Before procedure  and 3rd dose 1 hour before procedure.    Dispense:  3 tablet    Refill:  0    Medications Discontinued During This Encounter  Medication Reason  . diazepam (VALIUM) 10 MG tablet   . escitalopram (LEXAPRO)  10 MG tablet     Follow-up: No Follow-up on file.   Crecencio Mc, MD

## 2015-05-20 ENCOUNTER — Encounter: Payer: Self-pay | Admitting: Internal Medicine

## 2015-05-20 ENCOUNTER — Ambulatory Visit
Admission: RE | Admit: 2015-05-20 | Discharge: 2015-05-20 | Disposition: A | Discharge status: Home / Self Care | Payer: BLUE CROSS/BLUE SHIELD | Source: Ambulatory Visit | Attending: Internal Medicine | Admitting: Internal Medicine

## 2015-05-20 DIAGNOSIS — R599 Enlarged lymph nodes, unspecified: Secondary | ICD-10-CM | POA: Diagnosis present

## 2015-05-20 DIAGNOSIS — R938 Abnormal findings on diagnostic imaging of other specified body structures: Secondary | ICD-10-CM | POA: Diagnosis not present

## 2015-05-20 DIAGNOSIS — R591 Generalized enlarged lymph nodes: Secondary | ICD-10-CM

## 2015-05-20 MED ORDER — IOHEXOL 300 MG/ML  SOLN
75.0000 mL | Freq: Once | INTRAMUSCULAR | Status: AC | PRN
  Administered 2015-05-20: 75 mL via INTRAVENOUS

## 2015-05-26 ENCOUNTER — Encounter: Payer: Self-pay | Admitting: Internal Medicine

## 2015-06-02 ENCOUNTER — Encounter: Payer: Self-pay | Admitting: Internal Medicine

## 2015-06-09 ENCOUNTER — Encounter: Payer: Self-pay | Admitting: *Deleted

## 2015-06-09 ENCOUNTER — Inpatient Hospital Stay: Admission: RE | Admit: 2015-06-09 | Payer: BLUE CROSS/BLUE SHIELD | Source: Ambulatory Visit

## 2015-06-17 ENCOUNTER — Encounter: Payer: Self-pay | Admitting: *Deleted

## 2015-06-17 NOTE — Patient Instructions (Signed)
  Your procedure is scheduled on: 06-20-15 Report to [facility] To find out your arrival time please call [phone] between 1PM - 3PM on 06-17-15  Remember: Instructions that are not followed completely may result in serious medical risk, up to and including death, or upon the discretion of your surgeon and anesthesiologist your surgery may need to be rescheduled.    _X___ 1. Do not eat food or drink liquids after midnight. No gum chewing or hard candies.     _X___ 2. No Alcohol for 24 hours before or after surgery.   ____ 3. Bring all medications with you on the day of surgery if instructed.    ____ 4. Notify your doctor if there is any change in your medical condition     (cold, fever, infections).     Do not wear jewelry, make-up, hairpins, clips or nail polish.  Do not wear lotions, powders, or perfumes. You may wear deodorant.  Do not shave 48 hours prior to surgery. Men may shave face and neck.  Do not bring valuables to the hospital.    [facility] is not responsible for any belongings or valuables.               Contacts, dentures or bridgework may not be worn into surgery.  Leave your suitcase in the car. After surgery it may be brought to your room.  For patients admitted to the hospital, discharge time is determined by your  treatment team.   Patients discharged the day of surgery will not be allowed to drive home.   Please read over the following fact sheets that you were given:     ____ Take these medicines the morning of surgery with A SIP OF WATER:    1. MAY TAKE VALIUM IF NEEDED  2.   3.   4.  5.  6.  ____ Fleet Enema (as directed)   ____ Use CHG Soap as directed  ____ Use inhalers on the day of surgery  ____ Stop metformin 2 days prior to surgery    ____ Take 1/2 of usual insulin dose the night before surgery and none on the morning of surgery.   ____ Stop Coumadin/Plavix/aspirin-PT STOPPED EXCEDRIN MIGRAINE ON  06-01-15  ____ Stop Anti-inflammatories-NO  NSAIDS OR ASA PRODUCTS-TRAMADOL OK    _X___ Stop supplements until after surgery-STOP NUTRILITE NOW  ____ Bring C-Pap to the hospital.

## 2015-06-20 ENCOUNTER — Encounter
Admission: RE | Disposition: A | Discharge status: Home / Self Care | Payer: Self-pay | Source: Ambulatory Visit | Attending: Otolaryngology

## 2015-06-20 ENCOUNTER — Encounter: Payer: Self-pay | Admitting: Anesthesiology

## 2015-06-20 ENCOUNTER — Observation Stay
Admission: RE | Admit: 2015-06-20 | Discharge: 2015-06-21 | Disposition: A | Discharge status: Home / Self Care | Payer: BLUE CROSS/BLUE SHIELD | Source: Ambulatory Visit | Attending: Otolaryngology | Admitting: Otolaryngology

## 2015-06-20 ENCOUNTER — Ambulatory Visit: Payer: BLUE CROSS/BLUE SHIELD | Admitting: Anesthesiology

## 2015-06-20 DIAGNOSIS — Z91041 Radiographic dye allergy status: Secondary | ICD-10-CM | POA: Diagnosis not present

## 2015-06-20 DIAGNOSIS — E669 Obesity, unspecified: Secondary | ICD-10-CM | POA: Diagnosis not present

## 2015-06-20 DIAGNOSIS — Z823 Family history of stroke: Secondary | ICD-10-CM | POA: Insufficient documentation

## 2015-06-20 DIAGNOSIS — Z8262 Family history of osteoporosis: Secondary | ICD-10-CM | POA: Diagnosis not present

## 2015-06-20 DIAGNOSIS — Z8261 Family history of arthritis: Secondary | ICD-10-CM | POA: Diagnosis not present

## 2015-06-20 DIAGNOSIS — Z8349 Family history of other endocrine, nutritional and metabolic diseases: Secondary | ICD-10-CM | POA: Diagnosis not present

## 2015-06-20 DIAGNOSIS — Z8249 Family history of ischemic heart disease and other diseases of the circulatory system: Secondary | ICD-10-CM | POA: Insufficient documentation

## 2015-06-20 DIAGNOSIS — E785 Hyperlipidemia, unspecified: Secondary | ICD-10-CM | POA: Diagnosis not present

## 2015-06-20 DIAGNOSIS — F329 Major depressive disorder, single episode, unspecified: Secondary | ICD-10-CM | POA: Insufficient documentation

## 2015-06-20 DIAGNOSIS — G43909 Migraine, unspecified, not intractable, without status migrainosus: Secondary | ICD-10-CM | POA: Insufficient documentation

## 2015-06-20 DIAGNOSIS — D110 Benign neoplasm of parotid gland: Secondary | ICD-10-CM | POA: Diagnosis not present

## 2015-06-20 DIAGNOSIS — Z881 Allergy status to other antibiotic agents status: Secondary | ICD-10-CM | POA: Diagnosis not present

## 2015-06-20 DIAGNOSIS — Z880 Allergy status to penicillin: Secondary | ICD-10-CM | POA: Diagnosis not present

## 2015-06-20 DIAGNOSIS — Z9889 Other specified postprocedural states: Secondary | ICD-10-CM

## 2015-06-20 DIAGNOSIS — Z79899 Other long term (current) drug therapy: Secondary | ICD-10-CM | POA: Insufficient documentation

## 2015-06-20 DIAGNOSIS — M1990 Unspecified osteoarthritis, unspecified site: Secondary | ICD-10-CM | POA: Insufficient documentation

## 2015-06-20 DIAGNOSIS — D490 Neoplasm of unspecified behavior of digestive system: Secondary | ICD-10-CM | POA: Diagnosis present

## 2015-06-20 DIAGNOSIS — Z882 Allergy status to sulfonamides status: Secondary | ICD-10-CM | POA: Insufficient documentation

## 2015-06-20 DIAGNOSIS — Z888 Allergy status to other drugs, medicaments and biological substances status: Secondary | ICD-10-CM | POA: Insufficient documentation

## 2015-06-20 DIAGNOSIS — E119 Type 2 diabetes mellitus without complications: Secondary | ICD-10-CM | POA: Insufficient documentation

## 2015-06-20 DIAGNOSIS — F17200 Nicotine dependence, unspecified, uncomplicated: Secondary | ICD-10-CM | POA: Insufficient documentation

## 2015-06-20 DIAGNOSIS — Z85828 Personal history of other malignant neoplasm of skin: Secondary | ICD-10-CM | POA: Diagnosis not present

## 2015-06-20 DIAGNOSIS — Z818 Family history of other mental and behavioral disorders: Secondary | ICD-10-CM | POA: Diagnosis not present

## 2015-06-20 DIAGNOSIS — Z6831 Body mass index (BMI) 31.0-31.9, adult: Secondary | ICD-10-CM | POA: Insufficient documentation

## 2015-06-20 DIAGNOSIS — Z90710 Acquired absence of both cervix and uterus: Secondary | ICD-10-CM | POA: Diagnosis not present

## 2015-06-20 DIAGNOSIS — Z841 Family history of disorders of kidney and ureter: Secondary | ICD-10-CM | POA: Insufficient documentation

## 2015-06-20 DIAGNOSIS — Z811 Family history of alcohol abuse and dependence: Secondary | ICD-10-CM | POA: Diagnosis not present

## 2015-06-20 DIAGNOSIS — Z832 Family history of diseases of the blood and blood-forming organs and certain disorders involving the immune mechanism: Secondary | ICD-10-CM | POA: Insufficient documentation

## 2015-06-20 DIAGNOSIS — Z820 Family history of epilepsy and other diseases of the nervous system: Secondary | ICD-10-CM | POA: Insufficient documentation

## 2015-06-20 HISTORY — PX: PAROTIDECTOMY: SHX2163

## 2015-06-20 HISTORY — DX: Headache, unspecified: R51.9

## 2015-06-20 HISTORY — DX: Endometriosis, unspecified: N80.9

## 2015-06-20 HISTORY — DX: Malignant (primary) neoplasm, unspecified: C80.1

## 2015-06-20 HISTORY — DX: Localized edema: R60.0

## 2015-06-20 HISTORY — DX: Adverse effect of unspecified anesthetic, initial encounter: T41.45XA

## 2015-06-20 HISTORY — DX: Headache: R51

## 2015-06-20 HISTORY — DX: Other complications of anesthesia, initial encounter: T88.59XA

## 2015-06-20 LAB — GLUCOSE, CAPILLARY
Glucose-Capillary: 105 mg/dL — ABNORMAL HIGH (ref 65–99)
Glucose-Capillary: 141 mg/dL — ABNORMAL HIGH (ref 65–99)

## 2015-06-20 SURGERY — EXCISION, PAROTID GLAND
Anesthesia: General | Site: Neck | Laterality: Left | Wound class: Clean

## 2015-06-20 MED ORDER — FAMOTIDINE 20 MG PO TABS
ORAL_TABLET | ORAL | Status: AC
  Administered 2015-06-20: 20 mg via ORAL
  Filled 2015-06-20: qty 1

## 2015-06-20 MED ORDER — ONDANSETRON 4 MG PO TBDP: 4.0000 mg | ORAL_TABLET | Freq: Three times a day (TID) | ORAL | Status: DC | PRN

## 2015-06-20 MED ORDER — MORPHINE SULFATE (PF) 4 MG/ML IV SOLN
3.0000 mg | INTRAVENOUS | Status: DC | PRN
  Administered 2015-06-20 – 2015-06-21 (×2): 3 mg via INTRAVENOUS
  Filled 2015-06-20 (×2): qty 1

## 2015-06-20 MED ORDER — SODIUM CHLORIDE 0.9 % IV SOLN
INTRAVENOUS | Status: DC
  Administered 2015-06-20: 07:00:00 via INTRAVENOUS

## 2015-06-20 MED ORDER — METAXALONE 800 MG PO TABS
800.0000 mg | ORAL_TABLET | Freq: Three times a day (TID) | ORAL | Status: DC | PRN
  Filled 2015-06-20: qty 1

## 2015-06-20 MED ORDER — ONDANSETRON HCL 4 MG/2ML IJ SOLN: 4.0000 mg | Freq: Once | INTRAMUSCULAR | Status: DC | PRN

## 2015-06-20 MED ORDER — FLEET ENEMA 7-19 GM/118ML RE ENEM: 1.0000 | ENEMA | Freq: Once | RECTAL | Status: DC | PRN

## 2015-06-20 MED ORDER — ONDANSETRON HCL 4 MG/2ML IJ SOLN
INTRAMUSCULAR | Status: DC | PRN
  Administered 2015-06-20: 4 mg via INTRAVENOUS

## 2015-06-20 MED ORDER — FENTANYL CITRATE (PF) 100 MCG/2ML IJ SOLN
25.0000 ug | INTRAMUSCULAR | Status: AC | PRN
  Administered 2015-06-20 (×6): 25 ug via INTRAVENOUS

## 2015-06-20 MED ORDER — TRAMADOL HCL 50 MG PO TABS
100.0000 mg | ORAL_TABLET | Freq: Four times a day (QID) | ORAL | Status: DC | PRN
  Administered 2015-06-21: 100 mg via ORAL
  Filled 2015-06-20: qty 2

## 2015-06-20 MED ORDER — LACTATED RINGERS IV SOLN: INTRAVENOUS | Status: DC

## 2015-06-20 MED ORDER — EPHEDRINE SULFATE 50 MG/ML IJ SOLN
INTRAMUSCULAR | Status: DC | PRN
  Administered 2015-06-20 (×5): 5 mg via INTRAVENOUS

## 2015-06-20 MED ORDER — ONDANSETRON HCL 4 MG/2ML IJ SOLN: 4.0000 mg | Freq: Four times a day (QID) | INTRAMUSCULAR | Status: DC | PRN

## 2015-06-20 MED ORDER — SUCCINYLCHOLINE CHLORIDE 20 MG/ML IJ SOLN
INTRAMUSCULAR | Status: DC | PRN
  Administered 2015-06-20: 100 mg via INTRAVENOUS

## 2015-06-20 MED ORDER — PROPOFOL 10 MG/ML IV BOLUS
INTRAVENOUS | Status: DC | PRN
  Administered 2015-06-20: 130 mg via INTRAVENOUS

## 2015-06-20 MED ORDER — SODIUM CHLORIDE 0.9 % IV SOLN
10000.0000 ug | INTRAVENOUS | Status: DC | PRN
  Administered 2015-06-20: 25 ug/min via INTRAVENOUS

## 2015-06-20 MED ORDER — ACETAMINOPHEN 325 MG PO TABS: 650.0000 mg | ORAL_TABLET | Freq: Four times a day (QID) | ORAL | Status: DC | PRN

## 2015-06-20 MED ORDER — MAGNESIUM HYDROXIDE 400 MG/5ML PO SUSP: 30.0000 mL | Freq: Every day | ORAL | Status: DC | PRN

## 2015-06-20 MED ORDER — FUROSEMIDE 20 MG PO TABS
20.0000 mg | ORAL_TABLET | Freq: Two times a day (BID) | ORAL | Status: DC
  Administered 2015-06-20 (×2): 20 mg via ORAL
  Filled 2015-06-20 (×2): qty 1

## 2015-06-20 MED ORDER — FENTANYL CITRATE (PF) 100 MCG/2ML IJ SOLN
INTRAMUSCULAR | Status: AC
  Administered 2015-06-20: 25 ug via INTRAVENOUS
  Filled 2015-06-20: qty 2

## 2015-06-20 MED ORDER — LIDOCAINE-EPINEPHRINE (PF) 1 %-1:200000 IJ SOLN
INTRAMUSCULAR | Status: DC | PRN
Start: 2015-06-20 — End: 2015-06-20
  Administered 2015-06-20: 8 mL

## 2015-06-20 MED ORDER — PHENYLEPHRINE HCL 10 MG/ML IJ SOLN
INTRAMUSCULAR | Status: DC | PRN
  Administered 2015-06-20 (×2): 100 ug via INTRAVENOUS

## 2015-06-20 MED ORDER — BISACODYL 5 MG PO TBEC: 5.0000 mg | DELAYED_RELEASE_TABLET | Freq: Every day | ORAL | Status: DC | PRN

## 2015-06-20 MED ORDER — SPIRONOLACTONE 100 MG PO TABS
100.0000 mg | ORAL_TABLET | Freq: Every day | ORAL | Status: DC
  Administered 2015-06-20: 100 mg via ORAL
  Filled 2015-06-20: qty 1

## 2015-06-20 MED ORDER — DEXTROSE-NACL 5-0.45 % IV SOLN
INTRAVENOUS | Status: DC
  Administered 2015-06-20 – 2015-06-21 (×2): via INTRAVENOUS

## 2015-06-20 MED ORDER — BACITRACIN ZINC 500 UNIT/GM EX OINT
TOPICAL_OINTMENT | CUTANEOUS | Status: AC
  Filled 2015-06-20: qty 28.35

## 2015-06-20 MED ORDER — LIDOCAINE HCL (CARDIAC) 20 MG/ML IV SOLN
INTRAVENOUS | Status: DC | PRN
  Administered 2015-06-20: 80 mg via INTRAVENOUS

## 2015-06-20 MED ORDER — GLYCOPYRROLATE 0.2 MG/ML IJ SOLN
INTRAMUSCULAR | Status: DC | PRN
  Administered 2015-06-20: 0.2 mg via INTRAVENOUS

## 2015-06-20 MED ORDER — DIAZEPAM 5 MG PO TABS: 10.0000 mg | ORAL_TABLET | Freq: Four times a day (QID) | ORAL | Status: DC | PRN

## 2015-06-20 MED ORDER — LIDOCAINE-EPINEPHRINE (PF) 1 %-1:200000 IJ SOLN
INTRAMUSCULAR | Status: AC
  Filled 2015-06-20: qty 30

## 2015-06-20 MED ORDER — LACTATED RINGERS IV SOLN
INTRAVENOUS | Status: DC | PRN
  Administered 2015-06-20: 09:00:00 via INTRAVENOUS

## 2015-06-20 MED ORDER — FENTANYL CITRATE (PF) 100 MCG/2ML IJ SOLN
INTRAMUSCULAR | Status: DC | PRN
  Administered 2015-06-20: 100 ug via INTRAVENOUS
  Administered 2015-06-20 (×4): 50 ug via INTRAVENOUS

## 2015-06-20 MED ORDER — MIDAZOLAM HCL 2 MG/2ML IJ SOLN
INTRAMUSCULAR | Status: DC | PRN
Start: 2015-06-20 — End: 2015-06-20
  Administered 2015-06-20: 2 mg via INTRAVENOUS

## 2015-06-20 MED ORDER — HYDROCODONE-ACETAMINOPHEN 5-325 MG PO TABS
1.0000 | ORAL_TABLET | ORAL | Status: DC | PRN
  Administered 2015-06-20 – 2015-06-21 (×4): 2 via ORAL
  Administered 2015-06-21: 1 via ORAL
  Filled 2015-06-20 (×3): qty 2
  Filled 2015-06-20: qty 1
  Filled 2015-06-20: qty 2

## 2015-06-20 MED ORDER — ONDANSETRON 4 MG PO TBDP: 4.0000 mg | ORAL_TABLET | Freq: Four times a day (QID) | ORAL | Status: DC | PRN

## 2015-06-20 MED ORDER — GLIPIZIDE 5 MG PO TABS: 5.0000 mg | ORAL_TABLET | ORAL | Status: DC | PRN

## 2015-06-20 MED ORDER — FAMOTIDINE 20 MG PO TABS
20.0000 mg | ORAL_TABLET | Freq: Once | ORAL | Status: AC
  Administered 2015-06-20: 20 mg via ORAL

## 2015-06-20 MED ORDER — ACETAMINOPHEN 650 MG RE SUPP: 650.0000 mg | Freq: Four times a day (QID) | RECTAL | Status: DC | PRN

## 2015-06-20 SURGICAL SUPPLY — 37 items
BLADE SURG 15 STRL LF DISP TIS (BLADE) ×1 IMPLANT
BLADE SURG 15 STRL SS (BLADE) ×1
CORD BIP STRL DISP 12FT (MISCELLANEOUS) ×2 IMPLANT
DRAIN TLS ROUND 10FR (DRAIN) ×2 IMPLANT
DRAPE INCISE 23X17 IOBAN STRL (DRAPES) ×2
DRAPE INCISE IOBAN 23X17 STRL (DRAPES) ×2 IMPLANT
DRAPE MAG INST 16X20 L/F (DRAPES) ×2 IMPLANT
DRESSING TELFA 4X3 1S ST N-ADH (GAUZE/BANDAGES/DRESSINGS) ×2 IMPLANT
DRSG TEGADERM 2-3/8X2-3/4 SM (GAUZE/BANDAGES/DRESSINGS) ×6 IMPLANT
DRSG TEGADERM 4X4.75 (GAUZE/BANDAGES/DRESSINGS) ×2 IMPLANT
ELECT CAUTERY BLADE TIP 2.5 (TIP) ×2
ELECT EMG 20MM DUAL (MISCELLANEOUS) ×4
ELECT NEEDLE 20X.3 GREEN (MISCELLANEOUS) ×2
ELECTRODE CAUTERY BLDE TIP 2.5 (TIP) ×1 IMPLANT
ELECTRODE EMG 20MM DUAL (MISCELLANEOUS) ×2 IMPLANT
ELECTRODE NEEDLE 20X.3 GREEN (MISCELLANEOUS) ×1 IMPLANT
FORCEPS JEWEL BIP 4-3/4 STR (INSTRUMENTS) ×2 IMPLANT
GLOVE BIO SURGEON STRL SZ7.5 (GLOVE) ×2 IMPLANT
GLOVE EXAM LX STRL 7.5 (GLOVE) ×2 IMPLANT
GOWN STRL REUS W/ TWL LRG LVL3 (GOWN DISPOSABLE) ×3 IMPLANT
GOWN STRL REUS W/TWL LRG LVL3 (GOWN DISPOSABLE) ×3
HARMONIC SCALPEL FOCUS (MISCELLANEOUS) ×2 IMPLANT
HOOK STAY BLUNT/RETRACTOR 5M (MISCELLANEOUS) ×2 IMPLANT
LABEL OR SOLS (LABEL) ×2 IMPLANT
PACK HEAD/NECK (MISCELLANEOUS) ×2 IMPLANT
PAD GROUND ADULT SPLIT (MISCELLANEOUS) ×2 IMPLANT
PROBE MONO 100X0.75 ELECT 1.9M (MISCELLANEOUS) ×2 IMPLANT
SPONGE KITTNER 5P (MISCELLANEOUS) ×4 IMPLANT
SPONGE XRAY 4X4 16PLY STRL (MISCELLANEOUS) ×2 IMPLANT
SUT ETHILON 6 0 9-3 1X18 BLK (SUTURE) IMPLANT
SUT MERSILENE 4-0 WHT RB-1 (SUTURE) IMPLANT
SUT PROLENE 6 0 PC 1 (SUTURE) ×2 IMPLANT
SUT SILK 2 0 (SUTURE) ×1
SUT SILK 2-0 18XBRD TIE 12 (SUTURE) ×1 IMPLANT
SUT SILK 3 0 REEL (SUTURE) IMPLANT
SUT VIC AB 4-0 RB1 18 (SUTURE) ×2 IMPLANT
SYSTEM CHEST DRAIN TLS 7FR (DRAIN) IMPLANT

## 2015-06-20 NOTE — Anesthesia Preprocedure Evaluation (Signed)
Anesthesia Evaluation  Patient identified by MRN, date of birth, ID band Patient awake    Reviewed: Allergy & Precautions, NPO status , Patient's Chart, lab work & pertinent test results, reviewed documented beta blocker date and time   Airway Mallampati: II  TM Distance: >3 FB     Dental  (+) Chipped   Pulmonary Current Smoker,           Cardiovascular      Neuro/Psych  Headaches, PSYCHIATRIC DISORDERS Depression    GI/Hepatic   Endo/Other  diabetes, Type 2  Renal/GU      Musculoskeletal  (+) Arthritis ,   Abdominal   Peds  Hematology   Anesthesia Other Findings   Reproductive/Obstetrics                             Anesthesia Physical Anesthesia Plan  ASA: III  Anesthesia Plan: General   Post-op Pain Management:    Induction: Intravenous  Airway Management Planned: Oral ETT  Additional Equipment:   Intra-op Plan:   Post-operative Plan:   Informed Consent: I have reviewed the patients History and Physical, chart, labs and discussed the procedure including the risks, benefits and alternatives for the proposed anesthesia with the patient or authorized representative who has indicated his/her understanding and acceptance.     Plan Discussed with: CRNA  Anesthesia Plan Comments:         Anesthesia Quick Evaluation

## 2015-06-20 NOTE — Progress Notes (Signed)
06/20/15 7pm  Postop check.  Pt sore in left face/neck area,  But pain medication is working.  Wound without any swelling. Drain tube is ok.  Dressing intact.  Good facial nerve movement throughout face.  Stable postop.  Hopefully home tomorrow.  Margaretha Sheffield

## 2015-06-20 NOTE — Progress Notes (Signed)
Can smile and press lips together and squeeze eyes shut

## 2015-06-20 NOTE — H&P (Signed)
  H&P has been reviewed and no changes necessary. To be downloaded later. 

## 2015-06-20 NOTE — Progress Notes (Signed)
   06/20/15 1400  Clinical Encounter Type  Visited With Patient and family together  Visit Type Initial;Spiritual support  Referral From Nurse  Consult/Referral To Chaplain  Spiritual Encounters  Spiritual Needs Literature;Brochure  Stress Factors  Patient Stress Factors None identified  Family Stress Factors None identified  Chaplain rounded in the unit and offered a compassionate presence. The patient expressed that she wanted information for a HCPOA however, she did not believe she would complete while here. I explained that she could complete outside the hospital and bring copy back to the hospital and also for her medical doctor. Chaplain Katilyn Miltenberger A. Berea Majkowski Ext. [phone]. [phone]

## 2015-06-20 NOTE — Transfer of Care (Signed)
Immediate Anesthesia Transfer of Care Note  Patient: Kristie Jones  Procedure(s) Performed: Procedure(s): PAROTIDECTOMY (Left)  Patient Location: PACU  Anesthesia Type:General  Level of Consciousness: awake, alert  and oriented  Airway & Oxygen Therapy: Patient Spontanous Breathing and Patient connected to face mask oxygen  Post-op Assessment: Report given to RN and Post -op Vital signs reviewed and stable  Post vital signs: Reviewed and stable  Last Vitals:  Filed Vitals:   06/20/15 0622  BP: 133/74  Pulse: 79  Temp: 36.7 C  Resp: 20    Complications: No apparent anesthesia complications

## 2015-06-20 NOTE — Op Note (Signed)
DATE 06/20/2015  TIME 9:50 am    Patient Name Kristie Jones  MRN [id number]   Pre-Op Dx: Left parotid tumor, FNA suggesting Warthin's tumor  Post-op Dx: Same  Proc: Left superficial parotidectomy with sparing of the left facial nerve, facial nerve monitoring throughout the procedure   Surg: Clebert Wenger H  Asst:  Dr. Jeannie Fend Vaught  Anes:  GOT  EBL: 25 mL  Comp:  None  Findings:  Large inferior and posterior tumor mass that was lying superficial to the facial nerve. Several enlarged lymph nodes associated with it.  Procedure: The patient was brought to the operating suite and placed in supine position. She was given general anesthesia by oral endotracheal intubation. A shoulder roll was placed with then the head turned to the right to give good exposure to the left side of the face. Throat are placed in the periorbital muscle and the perioral muscle for continuous stimulation during the procedure. He is rechecked make sure they're working. She is then marked at the incision line starting in the preauricular crease, curving around the bottom of the earlobe, and then extending into the neck in a curvilinear fashion to a skin crease. The subcutaneous was infiltrated with 1% Xylocaine with epi 1:100,000. 8 mL was used. She was then prepped and draped in a sterile fashion.  The skin was incised and the anterior skin was elevated in the subcutaneous plane as well as the posterior skin elevated in the subcutaneous plane. Dissection was then carried deep in front of the auricular cartilage and following it down to the pointer. Inferiorly the posterior parotid was separated from the sternocleidomastoid muscle and allow it to go pulled forward see a dense capsule around the posterior border of the tumor. As dissection was carried deeper the tympano mastoid suture line was found. The nerve was noted deep to this and was directly visualized. It was stimulated to the facial movement. Inferior  attachments were then freed up to allow better visualization. The nerve traveled forward for about 2-1/2 cm before the at split. The pes anserina was easily seen. The tumor was completely inferior to the past so no further dissection of the superior nerve was needed and the superficial fascia of the parotid gland was trimmed at this area forward, to free up the lower portion of the mass. Dissection was carried along the inferior branch of the pes to track the inferior branches of the facial nerve underneath the tumor. This freed up the posterior border of the tumor. Inferiorly there some large nodes that were attached to this and they were freed up. The deep border was then freed as well make sure was separate from the facial nerve branches. The rest of the superficial fascia was then freed up to remove the entire tumor. This was sent for permanent section.  Wound was visualized and there was no significant bleeding. This was flushed copiously. The nerve was easily visualized. It was restimulated show good movement of the face.  The superficial fascia of the parotid was then pulled backward and much of the deep tissues were closed using 4-0 Mersilene to hold the fascia of the parotid to the fascia of the sternocleidomastoid. A 10 French TLS drain had been placed the wound through separate inferior stab incision. Multiple 4-0 Mersilene's were used for closure of the fascia which eliminated the dead space. The skin was then draped over the top of it and approximately 1/2 cm of skin needed to be trimmed as the skin was advanced  for closure along with the fascia. The skin edges were then closed with 40 Vicryls followed by 6-0 Prolene for holding the skin edges in apposition. The drain was placed to low continuous Vacutainer suction. Wound was covered with bacitracin, followed by Telfa and Tegaderm dressing.  The patient was awakened and taken to the recovery room in satisfactory condition. There were no operative  complications   Dispo:  To PACU to then be observed overnight on the surgical ward.  Plan:  Ice, elevation, analgesia, 23 hour suction drain  Jerian Morais H 06/20/2015 9:48 AM

## 2015-06-21 DIAGNOSIS — D110 Benign neoplasm of parotid gland: Secondary | ICD-10-CM | POA: Diagnosis not present

## 2015-06-21 LAB — SURGICAL PATHOLOGY

## 2015-06-21 MED ORDER — HYDROCODONE-ACETAMINOPHEN 5-325 MG PO TABS: 1.0000 | ORAL_TABLET | ORAL | Status: DC | PRN

## 2015-06-21 NOTE — Progress Notes (Signed)
06/21/2015 10:50 AM   Kristie Jones to be D/C'd Home per MD order.  Discussed prescriptions and follow up appointments with the patient. Prescriptions given to patient, medication list explained in detail. Pt verbalized understanding.    Medication List    STOP taking these medications        BIOFREEZE EX     loratadine-pseudoephedrine 10-240 MG 24 hr tablet  Commonly known as:  CLARITIN-D 24-hour     predniSONE 10 MG tablet  Commonly known as:  DELTASONE     predniSONE 50 MG tablet  Commonly known as:  DELTASONE     traMADol 50 MG tablet  Commonly known as:  ULTRAM      TAKE these medications        CENTRUM SILVER ADULT 50+ PO  Take 1 tablet by mouth daily.     clindamycin 300 MG capsule  Commonly known as:  CLEOCIN  Take 300 mg by mouth 3 (three) times daily.     [employer] LANCING DEVICE Misc  Check blood sugar once daily.  250.00     diazepam 10 MG tablet  Commonly known as:  VALIUM  Take 10 mg by mouth every 6 (six) hours as needed for anxiety.     EXCEDRIN MIGRAINE PO  Take by mouth as needed.     furosemide 20 MG tablet  Commonly known as:  LASIX  Take 1 tablet (20 mg total) by mouth 2 (two) times daily.     glipiZIDE 5 MG tablet  Commonly known as:  GLUCOTROL  Take 1 tablet (5 mg total) by mouth 2 (two) times daily before a meal.     HYDROcodone-acetaminophen 5-325 MG tablet  Commonly known as:  NORCO/VICODIN  Take 1-2 tablets by mouth every 4 (four) hours as needed for moderate pain.     metaxalone 800 MG tablet  Commonly known as:  SKELAXIN  Take 1 tablet (800 mg total) by mouth 3 (three) times daily.     modafinil 200 MG tablet  Commonly known as:  PROVIGIL  Take 1.5 tablets (300 mg total) by mouth daily.     ondansetron 4 MG disintegrating tablet  Commonly known as:  ZOFRAN-ODT  Take 1 tablet (4 mg total) by mouth every 8 (eight) hours as needed for nausea or vomiting.     OVER THE COUNTER MEDICATION  Take 1 tablet by mouth daily. NUTRI-LITE      spironolactone 100 MG tablet  Commonly known as:  ALDACTONE  Take 1 tablet (100 mg total) by mouth daily.        Filed Vitals:   06/21/15 0607  BP: 109/63  Pulse: 80  Temp:   Resp:     Skin clean, dry and intact without evidence of skin break down, no evidence of skin tears noted. IV catheter discontinued intact. Site without signs and symptoms of complications. Dressing and pressure applied. Pt denies pain at this time. No complaints noted.  An After Visit Summary was printed and given to the patient. Patient escorted via [place], and D/C home via private auto.  Dola Argyle

## 2015-06-21 NOTE — Plan of Care (Signed)
Problem: Skin Integrity: Goal: Risk for impaired skin integrity will decrease Outcome: Not Progressing Assess incision site for s/s of infection

## 2015-06-21 NOTE — Discharge Summary (Signed)
06/21/15 7:20 am  Discharge summary  Patient still is having some discomfort in her left neck however it is controlled with the Vicodin. Does not have any significant drainage. Facial nerve was working normally bilaterally.  The drain is removed and the dressing changed, wound looks excellent. Oral intake is good.  Patient is discharged home to follow-up in the office in 1 week. She have a regular diet. I will write for Vicodin to use for pain. Slowly increase activities as tolerated. Call if problems.  Margaretha Sheffield M.D.

## 2015-06-21 NOTE — Clinical Social Work Note (Signed)
CSW consulted for patient by admitting nurse but no reason was listed. Patient is alert and oriented and independent in all ADL's and has insurance. Patient is discharging to return home today. CSW signing off. Shela Leff MSW,LCSW [phone]

## 2015-06-22 ENCOUNTER — Telehealth: Payer: Self-pay | Admitting: Internal Medicine

## 2015-06-22 NOTE — Telephone Encounter (Signed)
Autumn [phone] [facility] called about the pt medication furosemide (LASIX) 20 MG tablet it should be 60 tablets instead of 30 tablets. Thank You!

## 2015-06-22 NOTE — Telephone Encounter (Signed)
Ok. Thanks!

## 2015-06-22 NOTE — Telephone Encounter (Signed)
Spoke with Pharmacist Autumn to make the change.

## 2015-06-23 NOTE — Anesthesia Postprocedure Evaluation (Signed)
  Anesthesia Post-op Note  Patient: Kristie Jones  Procedure(s) Performed: Procedure(s): PAROTIDECTOMY (Left)  Anesthesia type:General  Patient location: PACU  Post pain: Pain level controlled  Post assessment: Post-op Vital signs reviewed, Patient's Cardiovascular Status Stable, Respiratory Function Stable, Patent Airway and No signs of Nausea or vomiting  Post vital signs: Reviewed and stable  Last Vitals:  Filed Vitals:   06/21/15 0607  BP: 109/63  Pulse: 80  Temp:   Resp:     Level of consciousness: awake, alert  and patient cooperative  Complications: No apparent anesthesia complications

## 2015-08-02 ENCOUNTER — Other Ambulatory Visit: Payer: Self-pay | Admitting: Internal Medicine

## 2015-08-02 ENCOUNTER — Telehealth: Payer: Self-pay | Admitting: Internal Medicine

## 2015-08-02 DIAGNOSIS — E119 Type 2 diabetes mellitus without complications: Secondary | ICD-10-CM

## 2015-08-02 NOTE — Telephone Encounter (Signed)
Pt called about glipiZIDE (GLUCOTROL) 5 MG tablet not working well with pt. Pt states she thought that she was leaving the world, she gets a migraine headache,cannot see and cannot talk and she's cold. Pt states that the Invokament it's invokament and metphormine that worked for her.Pt needs a refill for diazepam (VALIUM) 10 MG tablet .Pharmacy is [facility], [place]. Thank You!

## 2015-08-02 NOTE — Telephone Encounter (Signed)
Please advise 

## 2015-08-02 NOTE — Telephone Encounter (Signed)
Refill for valium already sent for approval.

## 2015-08-02 NOTE — Telephone Encounter (Signed)
I will not refill the valium, as it is a controlled substance and was not prescribed by me or by anyone in our office  initially.    I will also not refill the invokanna or the metformin , because the last time her diabetes was addressed,  (June 2016) my notes state she  Was tolerating glipizide,  And the other two memds were stopped last November due to intolerance.  Please ask her to schedule fasting labs and an OV  30 minutes

## 2015-08-03 ENCOUNTER — Telehealth: Payer: Self-pay | Admitting: Internal Medicine

## 2015-08-03 MED ORDER — CANAGLIFLOZIN-METFORMIN HCL 150-1000 MG PO TABS: 1.0000 | ORAL_TABLET | Freq: Two times a day (BID) | ORAL | Status: DC

## 2015-08-03 NOTE — Telephone Encounter (Signed)
Will refill the invokanna for 30 days .  Labs ordered

## 2015-08-03 NOTE — Telephone Encounter (Signed)
Called and left a voicemail to callback. A MyChart was also sent explaining she needs to be seen and have fasting labs.

## 2015-08-03 NOTE — Telephone Encounter (Signed)
Patient stated she does not want to go without her invokanna because she cant tolerate glipizide. she doesn't want to "buttom out." i scheduled her on 08/26/15 at 4 pm. Patient doesn't know what to do about the medication. Patient will send confirmation if she's okay with getting fasting labs on Friday 08/05/15 if that's okay can orders be placed.

## 2015-08-04 ENCOUNTER — Other Ambulatory Visit: Payer: Self-pay

## 2015-08-04 MED ORDER — CANAGLIFLOZIN-METFORMIN HCL 150-1000 MG PO TABS: 1.0000 | ORAL_TABLET | Freq: Two times a day (BID) | ORAL | Status: DC

## 2015-08-04 NOTE — Progress Notes (Unsigned)
Sent to [facility] because [place] [facility] [place].

## 2015-08-11 ENCOUNTER — Telehealth: Payer: Self-pay | Admitting: Internal Medicine

## 2015-08-11 MED ORDER — METFORMIN HCL 1000 MG PO TABS: 1000.0000 mg | ORAL_TABLET | Freq: Two times a day (BID) | ORAL | Status: DC

## 2015-08-11 NOTE — Telephone Encounter (Signed)
Left patient a detailed message regarding new prescription and to make a OV and Lab appt.

## 2015-08-11 NOTE — Telephone Encounter (Signed)
FYI

## 2015-08-11 NOTE — Telephone Encounter (Signed)
metformin 1000 mg twice daily.  Sent to pharmacy. I cannot advise further without an office visit and labs/  Needs labs  And OV

## 2015-08-11 NOTE — Telephone Encounter (Signed)
Pt called about her medication of invokamet price if 150 for a 30day. What can she do? Pt states she cannot pay that. And she does not have any diabetes medication. Thank You!

## 2015-08-11 NOTE — Telephone Encounter (Signed)
PA approved for invokamet [phone]. Case # [id number] faxed to [facility] 08/11/15.

## 2015-08-11 NOTE — Telephone Encounter (Signed)
Pt called back to inform she got approved with no copay for the invokamet. Thank You!

## 2015-08-11 NOTE — Telephone Encounter (Signed)
Please advise. Thanks.  

## 2015-08-25 ENCOUNTER — Other Ambulatory Visit: Payer: BLUE CROSS/BLUE SHIELD

## 2015-08-26 ENCOUNTER — Ambulatory Visit: Payer: BLUE CROSS/BLUE SHIELD | Admitting: Internal Medicine

## 2015-09-01 ENCOUNTER — Ambulatory Visit (INDEPENDENT_AMBULATORY_CARE_PROVIDER_SITE_OTHER): Payer: BLUE CROSS/BLUE SHIELD | Admitting: Internal Medicine

## 2015-09-01 VITALS — BP 140/88 | HR 82 | Temp 98.1°F | Resp 12 | Ht 61.0 in | Wt 161.0 lb

## 2015-09-01 DIAGNOSIS — Z1159 Encounter for screening for other viral diseases: Secondary | ICD-10-CM | POA: Diagnosis not present

## 2015-09-01 DIAGNOSIS — D119 Benign neoplasm of major salivary gland, unspecified: Secondary | ICD-10-CM | POA: Diagnosis not present

## 2015-09-01 DIAGNOSIS — F331 Major depressive disorder, recurrent, moderate: Secondary | ICD-10-CM

## 2015-09-01 DIAGNOSIS — E119 Type 2 diabetes mellitus without complications: Secondary | ICD-10-CM

## 2015-09-01 DIAGNOSIS — R748 Abnormal levels of other serum enzymes: Secondary | ICD-10-CM

## 2015-09-01 DIAGNOSIS — Z716 Tobacco abuse counseling: Secondary | ICD-10-CM

## 2015-09-01 LAB — MICROALBUMIN / CREATININE URINE RATIO
Creatinine,U: 7.5 mg/dL
Microalb Creat Ratio: 28 mg/g (ref 0.0–30.0)
Microalb, Ur: 2.1 mg/dL — ABNORMAL HIGH (ref 0.0–1.9)

## 2015-09-01 LAB — COMPREHENSIVE METABOLIC PANEL
ALT: 17 U/L (ref 0–35)
AST: 19 U/L (ref 0–37)
Albumin: 4.7 g/dL (ref 3.5–5.2)
Alkaline Phosphatase: 126 U/L — ABNORMAL HIGH (ref 39–117)
BUN: 18 mg/dL (ref 6–23)
CO2: 33 mEq/L — ABNORMAL HIGH (ref 19–32)
Calcium: 9.8 mg/dL (ref 8.4–10.5)
Chloride: 94 mEq/L — ABNORMAL LOW (ref 96–112)
Creatinine, Ser: 0.91 mg/dL (ref 0.40–1.20)
GFR: 67.67 mL/min (ref >60.00)
Glucose, Bld: 111 mg/dL — ABNORMAL HIGH (ref 70–99)
Potassium: 3.6 mEq/L (ref 3.5–5.1)
Sodium: 137 mEq/L (ref 135–145)
Total Bilirubin: 0.4 mg/dL (ref 0.2–1.2)
Total Protein: 7.9 g/dL (ref 6.0–8.3)

## 2015-09-01 LAB — HEPATIC FUNCTION PANEL
ALT: 17 U/L (ref 0–35)
AST: 19 U/L (ref 0–37)
Albumin: 4.7 g/dL (ref 3.5–5.2)
Alkaline Phosphatase: 126 U/L — ABNORMAL HIGH (ref 39–117)
Bilirubin, Direct: 0 mg/dL (ref 0.0–0.3)
Total Bilirubin: 0.4 mg/dL (ref 0.2–1.2)
Total Protein: 7.9 g/dL (ref 6.0–8.3)

## 2015-09-01 LAB — LIPID PANEL
Cholesterol: 262 mg/dL — ABNORMAL HIGH (ref 0–200)
HDL: 61.4 mg/dL (ref >39.00)
LDL Cholesterol: 175 mg/dL — ABNORMAL HIGH (ref 0–99)
NonHDL: 200.35
Total CHOL/HDL Ratio: 4
Triglycerides: 125 mg/dL (ref 0.0–149.0)
VLDL: 25 mg/dL (ref 0.0–40.0)

## 2015-09-01 LAB — LDL CHOLESTEROL, DIRECT: Direct LDL: 181 mg/dL

## 2015-09-01 LAB — HEMOGLOBIN A1C: Hgb A1c MFr Bld: 6.3 % (ref 4.6–6.5)

## 2015-09-01 MED ORDER — TRAMADOL HCL 50 MG PO TABS: 100.0000 mg | ORAL_TABLET | Freq: Four times a day (QID) | ORAL | Status: DC | PRN

## 2015-09-01 MED ORDER — GABAPENTIN 100 MG PO CAPS: 100.0000 mg | ORAL_CAPSULE | Freq: Three times a day (TID) | ORAL | Status: DC

## 2015-09-01 MED ORDER — DIAZEPAM 10 MG PO TABS: 10.0000 mg | ORAL_TABLET | Freq: Every evening | ORAL | Status: DC | PRN

## 2015-09-01 NOTE — Progress Notes (Signed)
Subjective:  Patient ID: Kristie Jones, female    DOB: 06/14/1956  Age: 60 y.o. MRN: [id number]  CC: The primary encounter diagnosis was Diabetes mellitus without complication ([place]). Diagnoses of Need for hepatitis C screening test, Elevated alkaline phosphatase level, Controlled type 2 diabetes mellitus without complication, unspecified long term insulin use status ([place]), Warthin's tumor, Major depressive disorder, recurrent episode, moderate ([place]), and Tobacco abuse counseling were also pertinent to this visit.  HPI Kristie Jones presents for follow up on type 2 DM uncontrolled, last seen in June for DM  , patient crying  Today .  Multiple stressors,  Stopped lexapro on her own.  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 Pain has been severe,  Required oral surgery evaluation for suspected TMJ,  eval was negative .  predniosne taper in December.  In hospital had morphine, percocet and tramadol.  Has run out of tramadol and valium.   mother passed in mid December. lexapr made her feel like a zombie 10 mg dose. "I can barely go on"  Getting grief counselling through Hospice and her church.  Mother was her best friend. The valium helped her pain and her grief. Took off from work Nov 14 through Nov 28.  Mother had a fall  Hip fracture  in 2015,  Had a CVA 07/17/2023 , sent to [facility],  died on 08-06-2023 at the age of 51.   Her mother was sister to [employer].   Stopped glipizide due to recurrent low blood sugars in the 50's.  Invokamet 150/1000 taking  1/2 tablet bc whole tablet made her sick , anorexic.   Fastings are 98 to 120,  No lows   Takes claritin redi tabs and an organic  MVI purchased from Dr Kathyrn Sheriff.  by [employer])  But she  found it [place] for $5 less.   Spent 3 minutes discussing risk of continued tobacco abuse, including but not limited to CAD, PAD, hypertension, and CA.  He is not interested in pharmacotherapy at this time. Coffee with cream and sugar at 9 am .   Lab Results  Component Value Date   HGBA1C 6.3 09/01/2015     Outpatient Prescriptions Prior to Visit  Medication Sig Dispense Refill  . Aspirin-Acetaminophen-Caffeine (EXCEDRIN MIGRAINE PO) Take by mouth as needed.    . Canagliflozin-Metformin HCl [phone] MG TABS Take 1 tablet by mouth 2 (two) times daily. 60 tablet 0  . furosemide (LASIX) 20 MG tablet Take 1 tablet (20 mg total) by mouth 2 (two) times daily. 30 tablet 5  . Lanc[employer] ([employer] LANCING DEVICE) MISC Check blood sugar once daily.  250.00 100 each 6  . metaxalone (SKELAXIN) 800 MG tablet Take 1 tablet (800 mg total) by mouth 3 (three) times daily. (Patient taking differently: Take 800 mg by mouth 3 (three) times daily as needed. ) 90 tablet 2  . modafinil (PROVIGIL) 200 MG tablet Take 1.5 tablets (300 mg total) by mouth daily. 135 tablet 1  . ondansetron (ZOFRAN-ODT) 4 MG disintegrating tablet Take 1 tablet (4 mg total) by mouth every 8 (eight) hours as needed for nausea or vomiting. 30 tablet 1  . OVER THE COUNTER  MEDICATION Take 1 tablet by mouth daily. NUTRI-LITE    . spironolactone (ALDACTONE) 100 MG tablet Take 1 tablet (100 mg total) by mouth daily. 30 tablet 5  . diazepam (VALIUM) 10 MG tablet Take 10 mg by mouth every 6 (six) hours as needed for anxiety.    Marland Kitchen glipiZIDE (GLUCOTROL) 5 MG tablet Take 1 tablet (5 mg total) by mouth 2 (two) times daily before a meal. (Patient not taking: Reported on 09/01/2015) 60 tablet 3  . HYDROcodone-acetaminophen (NORCO/VICODIN) 5-325 MG tablet Take 1-2 tablets by mouth every 4 (four) hours as needed for moderate pain. (Patient not taking: Reported on 09/01/2015) 30 tablet 0  . metFORMIN (GLUCOPHAGE) 1000 MG tablet Take 1 tablet (1,000 mg total)  by mouth 2 (two) times daily with a meal. (Patient not taking: Reported on 09/01/2015) 180 tablet 3  . clindamycin (CLEOCIN) 300 MG capsule Take 300 mg by mouth 3 (three) times daily. Reported on 09/01/2015    . Multiple Vitamins-Minerals (CENTRUM SILVER ADULT 50+ PO) Take 1 tablet by mouth daily. Reported on 09/01/2015     No facility-administered medications prior to visit.    Review of Systems;  Patient denies headache, fevers, malaise, unintentional weight loss, skin rash, eye pain, sinus congestion and sinus pain, sore throat, dysphagia,  hemoptysis , cough, dyspnea, wheezing, chest pain, palpitations, orthopnea, edema, abdominal pain, nausea, melena, diarrhea, constipation, flank pain, dysuria, hematuria, urinary  Frequency, nocturia, numbness, tingling, seizures,  Focal weakness, Loss of consciousness,  Tremor, insomnia, depression, anxiety, and suicidal ideation.      Objective:  BP 140/88 mmHg  Pulse 82  Temp(Src) 98.1 F (36.7 C) (Oral)  Resp 12  Ht 5\' 1"  (1.549 m)  Wt 161 lb (73.029 kg)  BMI 30.44 kg/m2  SpO2 99%  BP Readings from Last 3 Encounters:  09/01/15 140/88  06/21/15 109/63  05/19/15 126/82    Wt Readings from Last 3 Encounters:  09/01/15 161 lb (73.029 kg)  06/20/15 165 lb (74.844 kg)  05/19/15 165 lb (74.844 kg)    General appearance: alert, cooperative and appears stated age Ears: normal TM's and external ear canals both ears Throat: lips, mucosa, and tongue normal; teeth and gums normal Neck: no adenopathy, no carotid bruit, supple, symmetrical, trachea midline and thyroid not enlarged, symmetric, no tenderness/mass/nodules Back: symmetric, no curvature. ROM normal. No CVA tenderness. Lungs: clear to auscultation bilaterally Heart: regular rate and rhythm, S1, S2 normal, no murmur, click, rub or gallop Abdomen: soft, non-tender; bowel sounds normal; no masses,  no organomegaly Pulses: 2+ and symmetric Skin: Skin color, texture, turgor normal. No  rashes or lesions Lymph nodes: Cervical, supraclavicular, and axillary nodes normal.  Lab Results  Component Value Date   HGBA1C 6.3 09/01/2015   HGBA1C 6.0 01/10/2015   HGBA1C 6.1 06/17/2014    Lab Results  Component Value Date   CREATININE 0.91 09/01/2015   CREATININE 0.97 01/10/2015   CREATININE 0.9 06/17/2014    Lab Results  Component Value Date   GLUCOSE 111* 09/01/2015   CHOL 262* 09/01/2015   TRIG 125.0 09/01/2015   HDL 61.40 09/01/2015   LDLDIRECT 181.0 09/01/2015   LDLCALC 175* 09/01/2015   ALT 17 09/01/2015   ALT 17 09/01/2015   AST 19 09/01/2015   AST 19 09/01/2015   NA 137 09/01/2015   K 3.6 09/01/2015   CL 94* 09/01/2015   CREATININE 0.91 09/01/2015   BUN 18 09/01/2015   CO2 33* 09/01/2015   TSH 1.54 06/17/2014   HGBA1C 6.3  09/01/2015   MICROALBUR 2.1* 09/01/2015    No results found.  Assessment & Plan:   Problem List Items Addressed This Visit    Diabetes mellitus type 2, controlled ([place])    Currently well-controlled on current medications .  hemoglobin A1c is at goal of less than 7.0 . Patient is reminded to schedule an annual eye exam and foot exam is normal today. Patient has minimalmicroalbuminuria and will nee dto start ACE I /ARB. Marland Kitchen Patient is tolerating statin therapy for CAD risk reduction and on ACE/ARB for renal protection and hypertension   Lab Results  Component Value Date   HGBA1C 6.3 09/01/2015   Lab Results  Component Value Date   MICROALBUR 2.1* 09/01/2015           Major depressive disorder, recurrent episode, moderate ([facility])    She did not tolerate lexapro due to excessive sedation,. but has been using valium prn insomnia. Complicated by  grief following the loss of her brother       Relevant Medications   diazepam (VALIUM) 10 MG tablet   Tobacco abuse counseling    .Smoking cessation instruction/counseling given:  counseled patient on the dangers of tobacco use, advised patient to stop smoking, and reviewed strategies  to maximize success      [place]'s tumor    resected from left parotid gland in December.  With persistent pain on left tmj side for a month        Other Visit Diagnoses    Diabetes mellitus without complication ([place])    -  Primary    Need for hepatitis C screening test        Elevated alkaline phosphatase level           I have discontinued Ms. Lampron's Multiple Vitamins-Minerals (CENTRUM SILVER ADULT 50+ PO) and clindamycin. I have also changed her diazepam and traMADol. Additionally, I am having her start on gabapentin. Lastly, I am having her maintain her ondansetron, glipiZIDE, metaxalone, modafinil, [employer] LANCING DEVICE, furosemide, spironolactone, OVER THE COUNTER MEDICATION, Aspirin-Acetaminophen-Caffeine (EXCEDRIN MIGRAINE PO), HYDROcodone-acetaminophen, Canagliflozin-Metformin HCl, and metFORMIN.  Meds ordered this encounter  Medications  . DISCONTD: traMADol (ULTRAM) 50 MG tablet    Sig: Take 2 tablets by mouth 4 (four) times daily as needed.  . diazepam (VALIUM) 10 MG tablet    Sig: Take 1 tablet (10 mg total) by mouth at bedtime as needed for anxiety.    Dispense:  30 tablet    Refill:  3  . traMADol (ULTRAM) 50 MG tablet    Sig: Take 2 tablets (100 mg total) by mouth every 6 (six) hours as needed.    Dispense:  120 tablet    Refill:  3  . gabapentin (NEURONTIN) 100 MG capsule    Sig: Take 1 capsule (100 mg total) by mouth 3 (three) times daily.    Dispense:  90 capsule    Refill:  3    A total of 40 minutes of face to face time was spent with patient more than half of which was spent in counselling and coordination of care  Medications Discontinued During This Encounter  Medication Reason  . clindamycin (CLEOCIN) 300 MG capsule Completed Course  . Multiple Vitamins-Minerals (CENTRUM SILVER ADULT 50+ PO) Change in therapy  . diazepam (VALIUM) 10 MG tablet Reorder  . traMADol (ULTRAM) 50 MG tablet Reorder    Follow-up: Return in about 3 months (around  11/30/2015).   Crecencio Mc, MD

## 2015-09-01 NOTE — Progress Notes (Signed)
Pre-visit discussion using our clinic review tool. No additional management support is needed unless otherwise documented below in the visit note.  

## 2015-09-01 NOTE — Patient Instructions (Addendum)
I am refilling your valium and tramadol and will try low dose gabapentin  For your jaw pain.  Start with 100 mg gabapentin at night.  Increase gradually to 3 tablets at night only.   If your A1c is > 7.0,  I will change the invokanamet to the dose with less metformin so you can take a full tablet twice daily

## 2015-09-03 ENCOUNTER — Encounter: Payer: Self-pay | Admitting: Internal Medicine

## 2015-09-03 NOTE — Assessment & Plan Note (Signed)
Smoking cessation instruction/counseling given:  counseled patient on the dangers of tobacco use, advised patient to stop smoking, and reviewed strategies to maximize success 

## 2015-09-03 NOTE — Assessment & Plan Note (Signed)
Currently well-controlled on current medications .  hemoglobin A1c is at goal of less than 7.0 . Patient is reminded to schedule an annual eye exam and foot exam is normal today. Patient has minimalmicroalbuminuria and will nee dto start ACE I /ARB. Marland Kitchen Patient is tolerating statin therapy for CAD risk reduction and on ACE/ARB for renal protection and hypertension   Lab Results  Component Value Date   HGBA1C 6.3 09/01/2015   Lab Results  Component Value Date   MICROALBUR 2.1* 09/01/2015

## 2015-09-03 NOTE — Assessment & Plan Note (Signed)
She did not tolerate lexapro due to excessive sedation,. but has been using valium prn insomnia. Complicated by  grief following the loss of her brother

## 2015-09-03 NOTE — Assessment & Plan Note (Signed)
resected from left parotid gland in December.  With persistent pain on left tmj side for a month

## 2015-09-04 ENCOUNTER — Encounter: Payer: Self-pay | Admitting: Internal Medicine

## 2015-09-15 ENCOUNTER — Telehealth: Payer: Self-pay

## 2015-09-15 MED ORDER — METFORMIN HCL 500 MG PO TABS: 500.0000 mg | ORAL_TABLET | Freq: Two times a day (BID) | ORAL | Status: DC

## 2015-09-15 NOTE — Telephone Encounter (Signed)
Spoke with patient, (Which was a major struggle) she was very upset on the phone.    She is not happy about only taking metformin.  She thought you were going to increase the invokana/metformin per her last visit.  I asked her about mychart and at first she was like yes I can email her and then starting crying and said she is so sick she has no strength to do that and doesn't understand why you wont just talk to her, that you know what she has been through and that it would only take a few minutes.  The conversion went on for approximately 15 mins.  In the end she is wanting to try something besides just metformin as that will make her just as sick.  I explained that I will not get an answer now as it is after five, but that I can email her or phone her tomorrow.    Please advise?

## 2015-09-15 NOTE — Telephone Encounter (Signed)
Patient left a message on the triage phone line, she has a few concerns regarding her medications. She said that at her 1/23 visit she had labs completed and if her A1C was greater then 7 you would reduce her metformin.  The metformin is making her sick, she has had diarrhea and feels like she is bottoming out.  She is having trouble taking food with it.  Her second concern was that she received an email regarding her ldl being high, and that you wanted her to trial Zocor.  She has concerns about that.   She is requesting a call back from you if possible.   Please advise.  Thanks

## 2015-09-15 NOTE — Telephone Encounter (Signed)
I have reduced the metformin to 500 mg twice daily.  Have her stop the metformin completely for a month, and if the BS are higher than 130 fasting or 160 post prandially resume the metformin at the lower dose 500 mg twice daily  I do not routinely call patients back who have Mychart,  Can she use the mychart to dicuss her concerns about zocor?

## 2015-09-15 NOTE — Addendum Note (Signed)
Addended by: Crecencio Mc on: 09/15/2015 04:41 PM   Modules accepted: Orders

## 2015-09-16 ENCOUNTER — Other Ambulatory Visit: Payer: Self-pay | Admitting: Internal Medicine

## 2015-09-16 DIAGNOSIS — E1121 Type 2 diabetes mellitus with diabetic nephropathy: Secondary | ICD-10-CM

## 2015-09-16 MED ORDER — CANAGLIFLOZIN 100 MG PO TABS: 100.0000 mg | ORAL_TABLET | Freq: Every day | ORAL | Status: DC

## 2015-09-16 NOTE — Telephone Encounter (Signed)
Her A1c was low at 6.3 on Invokanamet ,  But   Since she is not tolerating due to GI issues caused by metformin.  Will stop  metformin and use Invokana alone. rx for 100 mg daily sent to pharmacy

## 2015-09-16 NOTE — Telephone Encounter (Signed)
Sent Mychart message per patients request. Thanks

## 2015-09-16 NOTE — Assessment & Plan Note (Signed)
Her A1c was 6.3 on Invokanamet ,  But she is not tolerating due to GI issues caused by metformin.  Will stop  metformin and use Invokana alone. rx for 100 mg daily sent to pharmacy

## 2015-10-21 ENCOUNTER — Telehealth: Payer: Self-pay

## 2015-10-21 NOTE — Telephone Encounter (Signed)
Pt states that her sugar have been all over the place since taking Invokana. Pt states the this med is not controlling sugars. BG's have been running 175-150 everyday. Please advise, thanks

## 2015-10-21 NOTE — Telephone Encounter (Signed)
Pt wants meds called into [facility]

## 2015-10-21 NOTE — Telephone Encounter (Signed)
Left message for patient to return call to office. 

## 2015-10-21 NOTE — Telephone Encounter (Signed)
Pt states that she can not keep calling out of work b/c of out of control sugar, pt wants to be told what she should do in the meantime, pt c/o she feels like jello, headaches, and sick to stomach. Pt has concerns about the dangers of not regulating her sugars. Please advise, thaks

## 2015-10-21 NOTE — Telephone Encounter (Signed)
I need her to submit  sugars twice daily,  fastings and 2 hour post prandials (after a meal) for a week before I can make any medication changes  To her current regimen of Invokkanna only

## 2015-10-21 NOTE — Telephone Encounter (Signed)
Not without more info,  Her decision to stay out of work is not warranted  And she needs to make appts not expect me to react to message hat are vague

## 2015-10-21 NOTE — Telephone Encounter (Signed)
See note from 09/15/15

## 2015-10-24 NOTE — Telephone Encounter (Signed)
Pt called returning your call from Friday. Call pt on [phone]. Thank you!

## 2015-10-27 NOTE — Telephone Encounter (Addendum)
Invokanna since starting,  CBG's are over 160 the highest CBG has equaled 185. These CBG's at 2.45 2 hours after eating was 157 . Patient had taken provigil had eaten and 3 hour s after eating CBG was 167.Became nauseated and dizzy and took zofran and drink water and ate string cheese 8 pm cbg = 101.   Patient upset stated that phone calls are not being returned patient was called on Friday and again on Monday by nurse message left to return call.

## 2015-10-27 NOTE — Telephone Encounter (Signed)
Spoke with patient by phone and sent instructions to patient a MyChart message.

## 2015-10-27 NOTE — Telephone Encounter (Signed)
Patient has not complied with my requests.  I cannot make medication adjustments based on one or two readings of blood sugars when she is not reporting any low blood  Sugars.  She will need to make an appointment and bring a log of her blood sugar and a food diary of hte last two weeks with her to her visit .

## 2015-10-28 ENCOUNTER — Telehealth: Payer: Self-pay | Admitting: Internal Medicine

## 2015-10-28 NOTE — Telephone Encounter (Signed)
Pt would like to speak to [place]. She received your message, but would like to speak to you about her diabetes. She says the information is incorrect that is in her Mychart. Please call [phone].

## 2015-10-28 NOTE — Telephone Encounter (Signed)
Patient stated that being on invokanna only she has had no low blood sugars that she stated "I have been misunderstood that her concern is not low CBG's but spiking CBG'S". " Juliann Pulse are you frustrated with me? I have all calls to and from this clinic logged" " I just want the record straight this all started from glipizide lowering my CBG's to much and now they are spiking" Nurse advised patient she is not frustrated that I am typing exactly what patient stated to nurse. FYI

## 2015-11-17 ENCOUNTER — Ambulatory Visit: Payer: BLUE CROSS/BLUE SHIELD | Admitting: Internal Medicine

## 2015-11-23 ENCOUNTER — Other Ambulatory Visit: Payer: Self-pay | Admitting: Internal Medicine

## 2015-11-24 ENCOUNTER — Telehealth: Payer: Self-pay | Admitting: *Deleted

## 2015-11-24 MED ORDER — SPIRONOLACTONE 100 MG PO TABS: 100.0000 mg | ORAL_TABLET | Freq: Every day | ORAL | Status: DC

## 2015-11-24 MED ORDER — FUROSEMIDE 20 MG PO TABS: 20.0000 mg | ORAL_TABLET | Freq: Two times a day (BID) | ORAL | Status: DC

## 2015-11-24 NOTE — Telephone Encounter (Signed)
Patient requested medication refill for furosemide and spironolactone Pharmacy [place]

## 2015-12-01 ENCOUNTER — Ambulatory Visit: Payer: BC Managed Care – PPO | Admitting: Internal Medicine

## 2015-12-05 ENCOUNTER — Ambulatory Visit (INDEPENDENT_AMBULATORY_CARE_PROVIDER_SITE_OTHER): Payer: BLUE CROSS/BLUE SHIELD | Admitting: Internal Medicine

## 2015-12-05 ENCOUNTER — Ambulatory Visit
Admission: RE | Admit: 2015-12-05 | Discharge: 2015-12-05 | Disposition: A | Discharge status: Home / Self Care | Payer: BLUE CROSS/BLUE SHIELD | Source: Ambulatory Visit | Attending: Internal Medicine | Admitting: Internal Medicine

## 2015-12-05 ENCOUNTER — Telehealth: Payer: Self-pay | Admitting: *Deleted

## 2015-12-05 ENCOUNTER — Encounter: Payer: Self-pay | Admitting: Internal Medicine

## 2015-12-05 VITALS — BP 130/76 | HR 86 | Temp 98.1°F | Resp 12 | Ht 61.0 in | Wt 156.2 lb

## 2015-12-05 DIAGNOSIS — M542 Cervicalgia: Secondary | ICD-10-CM

## 2015-12-05 DIAGNOSIS — E785 Hyperlipidemia, unspecified: Secondary | ICD-10-CM | POA: Diagnosis not present

## 2015-12-05 DIAGNOSIS — E1121 Type 2 diabetes mellitus with diabetic nephropathy: Secondary | ICD-10-CM

## 2015-12-05 DIAGNOSIS — M546 Pain in thoracic spine: Secondary | ICD-10-CM | POA: Insufficient documentation

## 2015-12-05 DIAGNOSIS — R937 Abnormal findings on diagnostic imaging of other parts of musculoskeletal system: Secondary | ICD-10-CM | POA: Diagnosis not present

## 2015-12-05 DIAGNOSIS — E119 Type 2 diabetes mellitus without complications: Secondary | ICD-10-CM

## 2015-12-05 DIAGNOSIS — M419 Scoliosis, unspecified: Secondary | ICD-10-CM | POA: Insufficient documentation

## 2015-12-05 DIAGNOSIS — M5441 Lumbago with sciatica, right side: Secondary | ICD-10-CM | POA: Diagnosis not present

## 2015-12-05 DIAGNOSIS — M545 Low back pain, unspecified: Secondary | ICD-10-CM

## 2015-12-05 LAB — COMPREHENSIVE METABOLIC PANEL
ALT: 15 U/L (ref 0–35)
AST: 17 U/L (ref 0–37)
Albumin: 4.6 g/dL (ref 3.5–5.2)
Alkaline Phosphatase: 111 U/L (ref 39–117)
BUN: 18 mg/dL (ref 6–23)
CO2: 32 mEq/L (ref 19–32)
Calcium: 9.8 mg/dL (ref 8.4–10.5)
Chloride: 97 mEq/L (ref 96–112)
Creatinine, Ser: 0.93 mg/dL (ref 0.40–1.20)
GFR: 65.93 mL/min (ref >60.00)
Glucose, Bld: 106 mg/dL — ABNORMAL HIGH (ref 70–99)
Potassium: 3.6 mEq/L (ref 3.5–5.1)
Sodium: 139 mEq/L (ref 135–145)
Total Bilirubin: 0.4 mg/dL (ref 0.2–1.2)
Total Protein: 7.8 g/dL (ref 6.0–8.3)

## 2015-12-05 LAB — LDL CHOLESTEROL, DIRECT: Direct LDL: 174 mg/dL

## 2015-12-05 LAB — HEMOGLOBIN A1C: Hgb A1c MFr Bld: 6.2 % (ref 4.6–6.5)

## 2015-12-05 NOTE — Telephone Encounter (Signed)
You can give her the 2:00 slot that Sharee Pimple is scheduled for.

## 2015-12-05 NOTE — Telephone Encounter (Signed)
Patient called back and scheduled for 2pm, thanks

## 2015-12-05 NOTE — Telephone Encounter (Signed)
Spoke with her, she is going to call her manager and call me back. thanks

## 2015-12-05 NOTE — Progress Notes (Signed)
Subjective:  Patient ID: Kristie Jones, female    DOB: 1956-02-05  Age: 60 y.o. MRN: [id number]  CC: The primary encounter diagnosis was Low back pain with right-sided sciatica, unspecified back pain laterality. Diagnoses of Midline thoracic back pain, Diabetes mellitus without complication ([place]), Hyperlipidemia, Back pain of thoracolumbar region, Neck pain on left side, and Controlled type 2 diabetes mellitus with diabetic nephropathy, without long-term current use of insulin ([place]) were also pertinent to this visit.  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 during the push.  She is recovering from a left parotidectomy that was done  5 months ago in November and saw her ENT Dr Kathyrn Sheriff last week due to pain and swelling behind the left ear . Patient states that he said she may have torn a muscle  away from the permament stitches  But He did not order a scan  bc at the time the injury was deemed to be a Workmen's Comp issue. Since then the injury has been deemed ineligible for Workmen's Comp because she did not move the patient the correct way.  Seh states that she has been  In constant pain despite using neurontin, tramadol, and either valium or skelaxin when at home.  While at  work she is only taking excedrin bc of side effects.  She has also applied biofreeze and heat wraps.  Pain has gotten worse.  The low back pain  radiates to right calf.  Hurts to walk.  She reports Pain across T10 area that radiates to both sides.  .     Has been placed on work restrictions"  No pushing, pulling, and no lifting over 20 lbs   NO prior back  surgery.  Had a similar work related injury in 2007.  Received PT back then .    Lab Results  Component Value Date   MICROALBUR 2.1* 09/01/2015       Outpatient Prescriptions Prior to Visit  Medication Sig Dispense Refill  . Aspirin-Acetaminophen-Caffeine (EXCEDRIN MIGRAINE PO) Take by mouth as needed.    . canagliflozin (INVOKANA) 100 MG TABS tablet Take 1 tablet (100 mg total) by mouth daily before breakfast. 30 tablet 3  . diazepam (VALIUM) 10 MG tablet Take 1 tablet (10 mg total) by mouth at bedtime as needed for anxiety. 30 tablet 3  . furosemide (LASIX) 20 MG tablet Take 1 tablet (20 mg total) by mouth 2 (two) times daily. 30 tablet 5  . gabapentin (NEURONTIN) 100 MG capsule Take 1 capsule (100 mg total) by mouth 3 (three) times daily. 90 capsule 3  . [employer] ([employer] LANCING DEVICE) MISC Check blood sugar once daily.  250.00 100 each 6  . metaxalone (SKELAXIN) 800 MG tablet Take 1 tablet (800 mg total) by mouth 3 (three) times daily. (Patient taking differently: Take 800 mg by mouth 3 (three) times daily as needed. ) 90 tablet 2  . ondansetron (ZOFRAN-ODT) 4 MG disintegrating tablet Take 1 tablet (4 mg total) by mouth every 8 (eight) hours as needed for nausea or vomiting. 30 tablet 1  . OVER THE COUNTER MEDICATION Take 1  tablet by mouth daily. NUTRI-LITE    . spironolactone (ALDACTONE) 100 MG tablet Take 1 tablet (100 mg total) by mouth daily. 30 tablet 5  . traMADol (ULTRAM) 50 MG tablet Take 2 tablets (100 mg total) by mouth every 6 (six) hours as needed. 120 tablet 3  . modafinil (PROVIGIL) 200 MG tablet Take 1.5 tablets (300 mg total) by mouth daily. 135 tablet 1  . Canagliflozin-Metformin HCl [phone] MG TABS Take 1 tablet by mouth 2 (two) times daily. 60 tablet 0  . glipiZIDE (GLUCOTROL) 5 MG tablet Take 1 tablet (5 mg total) by mouth 2 (two) times daily before a meal. (Patient not taking: Reported on 09/01/2015) 60 tablet 3  . HYDROcodone-acetaminophen (NORCO/VICODIN) 5-325  MG tablet Take 1-2 tablets by mouth every 4 (four) hours as needed for moderate pain. (Patient not taking: Reported on 09/01/2015) 30 tablet 0   No facility-administered medications prior to visit.    Review of Systems;  Patient denies headache, fevers, malaise, unintentional weight loss, skin rash, eye pain, sinus congestion and sinus pain, sore throat, dysphagia,  hemoptysis , cough, dyspnea, wheezing, chest pain, palpitations, orthopnea, edema, abdominal pain, nausea, melena, diarrhea, constipation, flank pain, dysuria, hematuria, urinary  Frequency, nocturia, numbness, tingling, seizures,  Focal weakness, Loss of consciousness,  Tremor, insomnia, depression, anxiety, and suicidal ideation.      Objective:  BP 130/76 mmHg  Pulse 86  Temp(Src) 98.1 F (36.7 C) (Oral)  Resp 12  Ht 5\' 1"  (1.549 m)  Wt 156 lb 4 oz (70.875 kg)  BMI 29.54 kg/m2  SpO2 98%  BP Readings from Last 3 Encounters:  12/05/15 130/76  09/01/15 140/88  06/21/15 109/63    Wt Readings from Last 3 Encounters:  12/05/15 156 lb 4 oz (70.875 kg)  09/01/15 161 lb (73.029 kg)  06/20/15 165 lb (74.844 kg)    General appearance: alert, cooperative and appears stated age Back: symmetric, no curvature. ROM normal. No CVA tenderness. Lungs: clear to auscultation bilaterally Heart: regular rate and rhythm, S1, S2 normal, no murmur, click, rub or gallop Abdomen: soft, non-tender; bowel sounds normal; no masses,  no organomegaly Pulses: 2+ and symmetric Skin: Skin color, texture, turgor normal. No rashes or lesions Lymph nodes: Cervical, supraclavicular, and axillary nodes normal. MSK: normal patellar, achilles and biceps tendon reflexes. ROM normal.  Lab Results  Component Value Date   HGBA1C 6.2 12/05/2015   HGBA1C 6.3 09/01/2015   HGBA1C 6.0 01/10/2015    Lab Results  Component Value Date   CREATININE 0.93 12/05/2015   CREATININE 0.91 09/01/2015   CREATININE 0.97 01/10/2015    Lab Results  Component  Value Date   GLUCOSE 106* 12/05/2015   CHOL 262* 09/01/2015   TRIG 125.0 09/01/2015   HDL 61.40 09/01/2015   LDLDIRECT 174.0 12/05/2015   LDLCALC 175* 09/01/2015   ALT 15 12/05/2015   AST 17 12/05/2015   NA 139 12/05/2015   K 3.6 12/05/2015   CL 97 12/05/2015   CREATININE 0.93 12/05/2015   BUN 18 12/05/2015   CO2 32 12/05/2015   TSH 1.54 06/17/2014   HGBA1C 6.2 12/05/2015   MICROALBUR 2.1* 09/01/2015    No results found.  Assessment & Plan:   Problem List Items Addressed This Visit    Controlled type 2 diabetes mellitus with diabetic nephropathy ([place])    Currently well-controlled on  Invokanna alone  .  hemoglobin A1c is at goal of less than 7.0 . Patient is reminded to schedule an annual eye exam  and foot exam is normal today. Patient has minimalmicroalbuminuria and will nee d to  start ACE I /ARB. She will also be advised to start statin therapy given her cumulative risk of CAD. Of 25% using FRC  Lab Results  Component Value Date   HGBA1C 6.2 12/05/2015   Lab Results  Component Value Date   MICROALBUR 2.1* 09/01/2015    Lab Results  Component Value Date   CHOL 262* 09/01/2015   HDL 61.40 09/01/2015   LDLCALC 175* 09/01/2015   LDLDIRECT 174.0 12/05/2015   TRIG 125.0 09/01/2015   CHOLHDL 4 09/01/2015            Back pain of thoracolumbar region    Plain films ordered, to evaluate alignment and risk for herniation . Continue neurontin,  Tramadol, and MR.       Neck pain on left side    Will order imaging study once I see Dr Rozelle Logan note.  Per patient the slightl bulge noted behind her ear may be from  A loosened  internal stitch ,       Other Visit Diagnoses    Low back pain with right-sided sciatica, unspecified back pain laterality    -  Primary    Relevant Orders    DG Lumbar Spine 2-3 Views (Completed)    Midline thoracic back pain        Relevant Orders    DG Thoracic Spine W/Swimmers (Completed)    Diabetes mellitus without complication ([place])         Relevant Orders    Comprehensive metabolic panel (Completed)    Hemoglobin A1c (Completed)    Hyperlipidemia        Relevant Orders    LDL cholesterol, direct (Completed)       I have discontinued Ms. Nikkel's glipiZIDE, HYDROcodone-acetaminophen, and Canagliflozin-Metformin HCl. I am also having her maintain her ondansetron, metaxalone, modafinil, [employer] LANCING DEVICE, OVER THE COUNTER MEDICATION, Aspirin-Acetaminophen-Caffeine (EXCEDRIN MIGRAINE PO), diazepam, traMADol, gabapentin, canagliflozin, furosemide, and spironolactone.  No orders of the defined types were placed in this encounter.    Medications Discontinued During This Encounter  Medication Reason  . Canagliflozin-Metformin HCl [phone] MG TABS Patient Preference  . glipiZIDE (GLUCOTROL) 5 MG tablet Patient Preference  . HYDROcodone-acetaminophen (NORCO/VICODIN) 5-325 MG tablet Patient Preference    Follow-up: No Follow-up on file.   Crecencio Mc, MD

## 2015-12-05 NOTE — Telephone Encounter (Signed)
Patient was hurt at work on April 17th, was evaluated at employee health at [facility] [employer] and workers comp was involved, recommendations from them was to limit pulling pushing strain up to 20 lbs for one week.  Got a call on Thursday that workers comp was denied she didn't use the wheelchair mover that is policy and used her back to hold a door open.  They suggested for her to  Follow up with PCP and be evaluated if pain persisted, to see if scans need to be done,  Two areas affected are surgical site behind the ear, talked with ENT already and (they suggested to have one doctor evaluate everything) , blood blister behind her ear lobe,  Pulled lower and upper back, sciatica, on sides into her stomach, now pain is in her legs.    Expected to work Monday, Tuesday and Wednesday night.  Has used biofreeze, and OTC pain meds.  Is not sure what to do.  She said she could go to the [facility] but wants your opinion.  Your schedule was full today and she was requesting to see you.  Thanks

## 2015-12-05 NOTE — Telephone Encounter (Signed)
Patient stated that she had a work injury to her back and  Left ear(tumor). She wanted a note- noted in the chart that she called for an appointment, however with her work schedule she could not be seen the days provided. She questioned if she should go to [facility] [facility] to get an Xray and other test that may be needed.  Pt contact [phone]

## 2015-12-05 NOTE — Telephone Encounter (Signed)
I attempted to call the patient.  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 thanks

## 2015-12-05 NOTE — Progress Notes (Signed)
Pre-visit discussion using our clinic review tool. No additional management support is needed unless otherwise documented below in the visit note.  

## 2015-12-05 NOTE — Patient Instructions (Addendum)
I am ordering x rays of your thoracic and lumbar spine.  An MRI of the lumbar spine is likely to be necessary .  I would continue to work for now if you can,  Using the restrictions you were given .  Imaging studies of the neck will be ordered pending review of Dr Maisie Fus office note

## 2015-12-06 ENCOUNTER — Telehealth: Payer: Self-pay | Admitting: Internal Medicine

## 2015-12-06 ENCOUNTER — Encounter: Payer: Self-pay | Admitting: Internal Medicine

## 2015-12-06 DIAGNOSIS — M542 Cervicalgia: Secondary | ICD-10-CM | POA: Insufficient documentation

## 2015-12-06 DIAGNOSIS — M546 Pain in thoracic spine: Secondary | ICD-10-CM | POA: Insufficient documentation

## 2015-12-06 DIAGNOSIS — M545 Low back pain, unspecified: Secondary | ICD-10-CM | POA: Insufficient documentation

## 2015-12-06 NOTE — Telephone Encounter (Signed)
I have requested office notes from Dr. Kathyrn Sheriff to faxed today. FYI

## 2015-12-06 NOTE — Assessment & Plan Note (Signed)
Will order imaging study once I see Dr Rozelle Logan note.  Per patient the slightl bulge noted behind her ear may be from  A loosened  internal stitch ,

## 2015-12-06 NOTE — Assessment & Plan Note (Addendum)
Currently well-controlled on  Invokanna alone  .  hemoglobin A1c is at goal of less than 7.0 . Patient is reminded to schedule an annual eye exam and foot exam is normal today. Patient has minimalmicroalbuminuria and will nee d to  start ACE I /ARB. She will also be advised to start statin therapy given her cumulative risk of CAD. Of 25% using FRC  Lab Results  Component Value Date   HGBA1C 6.2 12/05/2015   Lab Results  Component Value Date   MICROALBUR 2.1* 09/01/2015    Lab Results  Component Value Date   CHOL 262* 09/01/2015   HDL 61.40 09/01/2015   LDLCALC 175* 09/01/2015   LDLDIRECT 174.0 12/05/2015   TRIG 125.0 09/01/2015   CHOLHDL 4 09/01/2015

## 2015-12-06 NOTE — Assessment & Plan Note (Addendum)
Plain films ordered, to evaluate alignment and risk for herniation . Continue neurontin,  Tramadol, and MR.

## 2015-12-16 ENCOUNTER — Telehealth: Payer: Self-pay | Admitting: Internal Medicine

## 2015-12-16 NOTE — Telephone Encounter (Signed)
Can I put together this letter to cover her time missed from work?

## 2015-12-16 NOTE — Telephone Encounter (Signed)
Yes please . Patient strained the muscles in her back while moving a patient.  She was advised to have PT but has not responded to my e mail about the x rays being essential normal

## 2015-12-16 NOTE — Telephone Encounter (Signed)
Pt called about needing to speak to the nurse regarding the encounter being taken out for work. Pt employee health states all Dr Derrel Nip needs to do is write a letter and discuss letter that was sent to pt. Dr Derrel Nip made some recommendations about pt xray and need more clarification and will follow through. Call pt @ [phone].

## 2015-12-19 ENCOUNTER — Encounter: Payer: Self-pay | Admitting: Internal Medicine

## 2015-12-19 NOTE — Telephone Encounter (Signed)
Patient requested a return call at [phone]

## 2015-12-19 NOTE — Telephone Encounter (Signed)
Spoke with the patient, she sent a mychart with specifics that are needed for the letter.  I have put together a letter for Dr. Derrel Nip to review and once it is completed I will fax it to [place] at [place] at [phone] phone number is [phone].

## 2015-12-19 NOTE — Telephone Encounter (Signed)
I have faxed the letter to [place] at [place].

## 2015-12-23 ENCOUNTER — Telehealth: Payer: Self-pay | Admitting: *Deleted

## 2015-12-23 DIAGNOSIS — M5431 Sciatica, right side: Secondary | ICD-10-CM

## 2015-12-23 NOTE — Telephone Encounter (Signed)
See below messages:  Has appt with Dr. Evorn Gong on Thursday after her appt with you to have them look at her foot, two spots that you noticed in January, one has a bump on it now. FYI.

## 2015-12-23 NOTE — Telephone Encounter (Signed)
Patient stated that she has pain and would like to go forward with the MRI. Please see previous my Chart message. She requested a call from [place]

## 2015-12-23 NOTE — Telephone Encounter (Signed)
Spoke with the Patient  She is good with the MRI being scheduled, wants to inquire about FMLA, with her work, has an appt with you next week.   She has missed work twice already.

## 2015-12-23 NOTE — Telephone Encounter (Signed)
MRI ordered of lumbar spine.

## 2015-12-23 NOTE — Telephone Encounter (Signed)
Yes, I will fill out her FMLA

## 2015-12-23 NOTE — Telephone Encounter (Signed)
thanks

## 2015-12-23 NOTE — Telephone Encounter (Signed)
Patient would like MRI, Please advise, thanks

## 2015-12-27 ENCOUNTER — Encounter: Payer: Self-pay | Admitting: Internal Medicine

## 2015-12-29 ENCOUNTER — Ambulatory Visit (INDEPENDENT_AMBULATORY_CARE_PROVIDER_SITE_OTHER): Payer: BLUE CROSS/BLUE SHIELD | Admitting: Internal Medicine

## 2015-12-29 ENCOUNTER — Encounter: Payer: Self-pay | Admitting: Internal Medicine

## 2015-12-29 VITALS — BP 120/78 | HR 77 | Temp 97.6°F | Resp 12 | Ht 61.0 in | Wt 155.2 lb

## 2015-12-29 DIAGNOSIS — M546 Pain in thoracic spine: Secondary | ICD-10-CM

## 2015-12-29 DIAGNOSIS — F331 Major depressive disorder, recurrent, moderate: Secondary | ICD-10-CM | POA: Diagnosis not present

## 2015-12-29 DIAGNOSIS — E785 Hyperlipidemia, unspecified: Secondary | ICD-10-CM | POA: Diagnosis not present

## 2015-12-29 DIAGNOSIS — M545 Low back pain, unspecified: Secondary | ICD-10-CM

## 2015-12-29 DIAGNOSIS — M542 Cervicalgia: Secondary | ICD-10-CM

## 2015-12-29 MED ORDER — ONDANSETRON 4 MG PO TBDP: 4.0000 mg | ORAL_TABLET | Freq: Three times a day (TID) | ORAL | Status: DC | PRN

## 2015-12-29 MED ORDER — ATORVASTATIN CALCIUM 20 MG PO TABS: 20.0000 mg | ORAL_TABLET | Freq: Every day | ORAL | Status: DC

## 2015-12-29 NOTE — Progress Notes (Signed)
Pre-visit discussion using our clinic review tool. No additional management support is needed unless otherwise documented below in the visit note.  

## 2015-12-29 NOTE — Patient Instructions (Addendum)
We are starting generic Lipitor for your risk of MI reduction .   Return in 6 weeks  For  Non fasting  Liver enzymes and other labs   Meloxicam 15 mg daily is worth trying for your back pain.    I will order the CT scan of your neck to evaluate if a muscle has been torn.  I will try to get the MRI moved up to an earlier appointment  at [facility] ,

## 2015-12-29 NOTE — Progress Notes (Signed)
Subjective:  Patient ID: Kristie Jones, female    DOB: 04/23/1956  Age: 60 y.o. MRN: [id number]  CC: The primary encounter diagnosis was Neck pain on left side. Diagnoses of Back pain of thoracolumbar region, Major depressive disorder, recurrent episode, moderate ([place]), and Hyperlipidemia LDL goal <100 were also pertinent to this visit.  HPI Future Haus Warth presents for 3 week follow up on multiple  Issues:  Patient seated on the examining tablet wrapped in a blanket .   Eyes closed, calmly composed. Marland Kitchen  Responding to questions initially with one work answers until she was  advised that this would not do .  Patient then became tearful and tangiental.   1) work related injury to back and neck .  She reports  persistent pain involving her lower back and left side of neck which occurred at work. .She saw her Dr. Kathyrn Sheriff about the neck pain since it occurred at the site of her recent parotid resection but no workup was done.  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  She is not taking any NSAIDs.  She was scheduled for an MRI at [facility] but was unable to get off work on such short notice and is scheduled for MRI lumbar spine June 6 at [facility]. She states that she was called by [facility] yesterday and treated as a "no show" because our office did not cancel the MRI.  Our scheduler disagrees and emphatically reports that she cancelled the MRI at [facility] .   FMLA eventually discussed.  Patient states that she has very little time left since was out for 2 weeks following her parotid gland resection, and had taken a month off after her mother died.   2) she works nights at the [employer] center.  By Thursday morning she feels exhausted    She is not tolerating working night shifts because it is aggravating her depression.   "I feel like I'm slipping away." Patient is distraught today.  Even with the limitations on  her workload that were outlined and enacted since her injury,  She feels overwhelmed by her work load.  She is afraid she is causing irreparable damage to her body by continuing to work.  She cannot take narcotics to manage her pain because she works.    She is miserable at her job and has been for  Several years.  She has multiple intolerances intolerances history of depression and state  that the recent trial of  lexapro that was prescribed in January made her depression worse. Reviewed list of other medications tried .   Has not seen a psychiatrist in several years, she can't remember his name.  In [place].   But prior trial of cymbalta for 30 days of samples did not see a lot of difference. Also tried wellbutrin ,  Zoloft have her uncontrollable diarrhea .  Psychiatry referral discussed and strongly advised.   3) Proteinuria:  She was advised via e mail to start lisinopril  At a low dose due to he development of microalbuminuria.  She has a history of  Adverse effects on prior trial of lisinopril ; apparently  5 mg dose caused recurrent syncope so she stopped it. It appears the syncopal events occurred after receiving general anesthesia for a procedure.   4) Hyperlipidemia:  She was advised to consdier statin therapy for elevated  risk of CAD based on her recent lipid profile and concurrent history of diabetes AND TOBACCO ABUSE. Discussed her reluctance to start statin therapy.  Potential side effects discussed.  Her mother developed  insomnia  from use of statin .   Outpatient Prescriptions Prior to Visit  Medication Sig Dispense Refill  . Aspirin-Acetaminophen-Caffeine (EXCEDRIN MIGRAINE PO) Take by mouth as needed.    . canagliflozin (INVOKANA) 100 MG TABS tablet Take 1 tablet (100 mg total) by mouth daily before breakfast. 30 tablet 3  . diazepam (VALIUM) 10 MG tablet Take 1 tablet (10 mg total) by mouth at bedtime as needed for anxiety. 30 tablet 3  . furosemide (LASIX) 20 MG tablet Take 1  tablet (20 mg total) by mouth 2 (two) times daily. 30 tablet 5  . gabapentin (NEURONTIN) 100 MG capsule Take 1 capsule (100 mg total) by mouth 3 (three) times daily. 90 capsule 3  . [employer] ([employer] LANCING DEVICE) MISC Check blood sugar once daily.  250.00 100 each 6  . metaxalone (SKELAXIN) 800 MG tablet Take 1 tablet (800 mg total) by mouth 3 (three) times daily. (Patient taking differently: Take 800 mg by mouth 3 (three) times daily as needed. ) 90 tablet 2  . modafinil (PROVIGIL) 200 MG tablet Take 1.5 tablets (300 mg total) by mouth daily. 135 tablet 1  . OVER THE COUNTER MEDICATION Take 1 tablet by mouth daily. NUTRI-LITE    . spironolactone (ALDACTONE) 100 MG tablet Take 1 tablet (100 mg total) by mouth daily. 30 tablet 5  . traMADol (ULTRAM) 50 MG tablet Take 2 tablets (100 mg total) by mouth every 6 (six) hours as needed. 120 tablet 3  . ondansetron (ZOFRAN-ODT) 4 MG disintegrating tablet Take 1 tablet (4 mg total) by mouth every 8 (eight) hours as needed for nausea or vomiting. 30 tablet 1   No facility-administered medications prior to visit.    Review of Systems;  Patient denies fevers, , unintentional weight loss, skin rash, eye pain, sinus congestion and sinus pain, sore throat, dysphagia,  hemoptysis , cough, dyspnea, wheezing, chest pain, palpitations, orthopnea, edema, abdominal pain, nausea, melena, diarrhea, constipation, flank pain, dysuria, hematuria, urinary  Frequency, nocturia, numbness, tingling, seizures,  Focal weakness, Loss of consciousness,  Tremor, insomnia, and suicidal ideation.      Objective:  BP 120/78 mmHg  Pulse 77  Temp(Src) 97.6 F (36.4 C) (Oral)  Resp 12  Ht 5\' 1"  (1.549 m)  Wt 155 lb 4 oz (70.421 kg)  BMI 29.35 kg/m2  SpO2 99%  BP Readings from Last 3 Encounters:  12/29/15 120/78  12/05/15 130/76  09/01/15 140/88    Wt Readings from Last 3 Encounters:  12/29/15 155 lb 4 oz (70.421 kg)  12/05/15 156 lb 4 oz (70.875 kg)  09/01/15 161  lb (73.029 kg)    General appearance: alert, cooperative and appears stated age Ears: normal TM's and external ear canals both ears Throat: lips, mucosa, and tongue normal; teeth and gums normal Neck: no adenopathy, no carotid bruit, supple, symmetrical, trachea midline and thyroid not enlarged, symmetric, no tenderness/mass/nodules Back: symmetric, no curvature. ROM normal. No CVA tenderness. Lungs: clear to auscultation bilaterally Heart: regular rate and rhythm, S1, S2 normal, no murmur, click, rub or gallop Abdomen: soft, non-tender; bowel sounds normal; no masses,  no organomegaly Pulses: 2+ and symmetric Skin: Skin color, texture, turgor normal. No rashes or lesions Lymph nodes: Cervical, supraclavicular, and axillary nodes normal.  Lab Results  Component Value Date  HGBA1C 6.2 12/05/2015   HGBA1C 6.3 09/01/2015   HGBA1C 6.0 01/10/2015    Lab Results  Component Value Date   CREATININE 0.93 12/05/2015   CREATININE 0.91 09/01/2015   CREATININE 0.97 01/10/2015    Lab Results  Component Value Date   GLUCOSE 106* 12/05/2015   CHOL 262* 09/01/2015   TRIG 125.0 09/01/2015   HDL 61.40 09/01/2015   LDLDIRECT 174.0 12/05/2015   LDLCALC 175* 09/01/2015   ALT 15 12/05/2015   AST 17 12/05/2015   NA 139 12/05/2015   K 3.6 12/05/2015   CL 97 12/05/2015   CREATININE 0.93 12/05/2015   BUN 18 12/05/2015   CO2 32 12/05/2015   TSH 1.54 06/17/2014   HGBA1C 6.2 12/05/2015   MICROALBUR 2.1* 09/01/2015    Dg Thoracic Spine W/swimmers  12/05/2015  CLINICAL DATA:  Acute thoracic spine pain after injury at work. EXAM: THORACIC SPINE - 3 VIEWS COMPARISON:  None. FINDINGS: There is no evidence of thoracic spine fracture. Alignment is normal. No other significant bone abnormalities are identified. IMPRESSION: Normal thoracic spine. Electronically Signed   By: Marijo Conception, M.D.   On: 12/05/2015 16:21   Dg Lumbar Spine 2-3 Views  12/05/2015  CLINICAL DATA:  Lumbago with right-sided  radicular symptoms for 2 weeks EXAM: LUMBAR SPINE - 2-3 VIEW COMPARISON:  None. FINDINGS: Frontal, lateral, and spot lumbosacral lateral images were obtained. There are 5 non-rib-bearing lumbar type vertebral bodies. There is lower lumbar levoscoliosis. There is no fracture or spondylolisthesis. There is mild disc space narrowing at L4-5. Other disc spaces appear normal. There is an anterior osteophyte along the inferior aspect of the L1 vertebral body. No erosive change. There is atherosclerotic calcification in the aorta. There is a 2 mm calcification to the left of the L3-4 interspace. IMPRESSION: Mild scoliosis. Mild osteoarthritic change. No fracture or spondylolisthesis. Areas of atherosclerotic calcification aorta. Small calcification on the left, lateral to the L3-4 interspace level of uncertain etiology. Electronically Signed   By: Lowella Grip III M.D.   On: 12/05/2015 16:22    Assessment & Plan:   Problem List Items Addressed This Visit    Major depressive disorder, recurrent episode, moderate ([place])    Her depression is chronic/recurrent and aggravated by unhappiness at her current job.  She is resistant to all medications that I am comfortable  prescribing  Will refer to Psychiatry.       Back pain of thoracolumbar region    Plain films failed to suggest anything more than mild disk herniation at L4-5.  No vertebral fracture, or spondylosis, but her pain is persistent after nearly 4 weeks.  MRI ordered and Pain clinic referral advised         Neck pain on left side - Primary    Will order CT scan of neck given her persistent pain and development of a slight bulge  Behind her ear that may be from a loosened  Internal stitch.         Relevant Orders   US Soft Tissue Head/Neck   Hyperlipidemia LDL goal <100    I have advised statin therapy based on her elevated risk profile. Generic Lipitor.  Return in 6 weeks for CMET>       Relevant Medications   atorvastatin (LIPITOR) 20  MG tablet     A total of 40 minutes was spent with patient more than half of which was spent in counseling patient on the above mentioned issues , reviewing and explaining  recent labs and imaging studies done, and coordination of care.  I am having Ms. Hazan start on atorvastatin. I am also having her maintain her metaxalone, modafinil, [employer] LANCING DEVICE, OVER THE COUNTER MEDICATION, Aspirin-Acetaminophen-Caffeine (EXCEDRIN MIGRAINE PO), diazepam, traMADol, gabapentin, canagliflozin, furosemide, spironolactone, and ondansetron.  Meds ordered this encounter  Medications  . ondansetron (ZOFRAN-ODT) 4 MG disintegrating tablet    Sig: Take 1 tablet (4 mg total) by mouth every 8 (eight) hours as needed for nausea or vomiting.    Dispense:  30 tablet    Refill:  1  . atorvastatin (LIPITOR) 20 MG tablet    Sig: Take 1 tablet (20 mg total) by mouth daily.    Dispense:  30 tablet    Refill:  0    Medications Discontinued During This Encounter  Medication Reason  . ondansetron (ZOFRAN-ODT) 4 MG disintegrating tablet Reorder    Follow-up: No Follow-up on file.   Crecencio Mc, MD

## 2015-12-31 DIAGNOSIS — E785 Hyperlipidemia, unspecified: Secondary | ICD-10-CM | POA: Insufficient documentation

## 2015-12-31 NOTE — Assessment & Plan Note (Signed)
Her depression is chronic/recurrent and aggravated by unhappiness at her current job.  She is resistant to all medications that I am comfortable  prescribing  Will refer to Psychiatry.

## 2015-12-31 NOTE — Assessment & Plan Note (Signed)
I have advised statin therapy based on her elevated risk profile. Generic Lipitor.  Return in 6 weeks for CMET>

## 2015-12-31 NOTE — Assessment & Plan Note (Addendum)
Plain films failed to suggest anything more than mild disk herniation at L4-5.  No vertebral fracture, or spondylosis, but her pain is persistent after nearly 4 weeks.  MRI ordered and Pain clinic referral advised

## 2015-12-31 NOTE — Assessment & Plan Note (Signed)
Will order CT scan of neck given her persistent pain and development of a slight bulge  Behind her ear that may be from a loosened  Internal stitch.

## 2016-01-02 ENCOUNTER — Encounter: Payer: Self-pay | Admitting: Internal Medicine

## 2016-01-03 ENCOUNTER — Telehealth: Payer: Self-pay | Admitting: *Deleted

## 2016-01-03 ENCOUNTER — Other Ambulatory Visit: Payer: Self-pay

## 2016-01-03 MED ORDER — MODAFINIL 200 MG PO TABS: 300.0000 mg | ORAL_TABLET | Freq: Every day | ORAL | Status: DC

## 2016-01-03 MED ORDER — GABAPENTIN 100 MG PO CAPS: 100.0000 mg | ORAL_CAPSULE | Freq: Three times a day (TID) | ORAL | Status: DC

## 2016-01-03 MED ORDER — SPIRONOLACTONE 100 MG PO TABS: 100.0000 mg | ORAL_TABLET | Freq: Every day | ORAL | Status: DC

## 2016-01-03 MED ORDER — FUROSEMIDE 20 MG PO TABS: 20.0000 mg | ORAL_TABLET | Freq: Two times a day (BID) | ORAL | Status: DC

## 2016-01-03 NOTE — Telephone Encounter (Signed)
Patient requested to have Tonya call her in reference to her work restriction note. Her work restriction note expired on 01/02/16 Patient also requested to speak with Juliann Pulse, in reference to last office visit Pt Contact [phone]

## 2016-01-03 NOTE — Telephone Encounter (Signed)
Juliann Pulse, please advise, I didn't know if you got this message too, I wrote a work note, but it has no expiration on it so I am not sure what she is referring to? Do you know?

## 2016-01-03 NOTE — Telephone Encounter (Signed)
Left message to return call to office.

## 2016-01-04 ENCOUNTER — Other Ambulatory Visit: Payer: Self-pay

## 2016-01-04 ENCOUNTER — Other Ambulatory Visit: Payer: Self-pay | Admitting: Internal Medicine

## 2016-01-04 DIAGNOSIS — Z760 Encounter for issue of repeat prescription: Secondary | ICD-10-CM

## 2016-01-04 MED ORDER — TRAMADOL HCL 50 MG PO TABS: 100.0000 mg | ORAL_TABLET | Freq: Four times a day (QID) | ORAL | Status: DC | PRN

## 2016-01-04 NOTE — Telephone Encounter (Signed)
refilled 

## 2016-01-05 NOTE — Telephone Encounter (Signed)
Refill faxed

## 2016-01-06 ENCOUNTER — Telehealth: Payer: Self-pay | Admitting: Internal Medicine

## 2016-01-06 ENCOUNTER — Other Ambulatory Visit: Payer: Self-pay

## 2016-01-06 DIAGNOSIS — Z0184 Encounter for antibody response examination: Secondary | ICD-10-CM

## 2016-01-06 DIAGNOSIS — Z760 Encounter for issue of repeat prescription: Secondary | ICD-10-CM

## 2016-01-06 NOTE — Telephone Encounter (Signed)
No documentation of measles immunity or titers on file. Spoke with the patient she is needing documentation, can we order a titer/immunity test for her?

## 2016-01-06 NOTE — Telephone Encounter (Signed)
Ordered

## 2016-01-06 NOTE — Telephone Encounter (Signed)
Pt called about needing the measles immunity documentation. Pt wants to know if it's her medication records when she was going to Dr Hoy Morn?   Call pt @ [phone]. Thank you!

## 2016-01-10 ENCOUNTER — Telehealth: Payer: Self-pay | Admitting: Internal Medicine

## 2016-01-10 ENCOUNTER — Other Ambulatory Visit: Payer: Self-pay | Admitting: Internal Medicine

## 2016-01-10 ENCOUNTER — Ambulatory Visit: Payer: BLUE CROSS/BLUE SHIELD

## 2016-01-11 NOTE — Telephone Encounter (Signed)
Called and left patient 2 message s no return call

## 2016-01-13 ENCOUNTER — Other Ambulatory Visit: Payer: Self-pay | Admitting: Internal Medicine

## 2016-01-13 DIAGNOSIS — M5440 Lumbago with sciatica, unspecified side: Secondary | ICD-10-CM

## 2016-01-17 ENCOUNTER — Telehealth: Payer: Self-pay | Admitting: Internal Medicine

## 2016-01-17 DIAGNOSIS — E041 Nontoxic single thyroid nodule: Secondary | ICD-10-CM | POA: Insufficient documentation

## 2016-01-17 NOTE — Telephone Encounter (Signed)
MyChart message sent re ultrasound showing a 1 cm nodule

## 2016-01-19 ENCOUNTER — Encounter: Payer: Self-pay | Admitting: Internal Medicine

## 2016-01-20 ENCOUNTER — Telehealth: Payer: Self-pay | Admitting: *Deleted

## 2016-01-20 DIAGNOSIS — E041 Nontoxic single thyroid nodule: Secondary | ICD-10-CM

## 2016-01-20 NOTE — Telephone Encounter (Signed)
Measles titer was order already

## 2016-01-20 NOTE — Telephone Encounter (Signed)
Have you spoken with her regarding FMLA? I only completed that letter and I don't know anything about what she might need. thanks

## 2016-01-20 NOTE — Telephone Encounter (Signed)
Patient requested a call from [place]. She wanted to discuss FMLA paperwork Pt contact [phone]

## 2016-01-20 NOTE — Telephone Encounter (Signed)
Patient needs referral to Dr. Ayesha Mohair for nodule on thyroid.Patient also needs FMLA for the dates she was out for testing due to injury at work advised patient to write down dates and fax to MD and I would let MD know. Patient also needs blood work to test for immunity to measles during visit in July.

## 2016-01-22 NOTE — Addendum Note (Signed)
Addended by: Crecencio Mc on: 01/22/2016 04:47 PM   Modules accepted: Orders

## 2016-01-22 NOTE — Telephone Encounter (Signed)
Referral is in process as requested 

## 2016-01-23 NOTE — Telephone Encounter (Signed)
Mychart message sent.

## 2016-01-24 NOTE — Telephone Encounter (Signed)
Patient requesting a work restrictions letter for Back can I do this, Patient also has FMLA papers in redfolder.

## 2016-01-25 NOTE — Telephone Encounter (Signed)
Yes you can do the letter and restrict any lifting to 20 lbs.  i do not have the FMLA form.

## 2016-01-25 NOTE — Telephone Encounter (Signed)
Letter printed in quick sign.

## 2016-01-26 ENCOUNTER — Encounter: Payer: Self-pay | Admitting: Internal Medicine

## 2016-01-26 ENCOUNTER — Telehealth: Payer: Self-pay | Admitting: Internal Medicine

## 2016-01-26 DIAGNOSIS — Z7689 Persons encountering health services in other specified circumstances: Secondary | ICD-10-CM

## 2016-01-26 NOTE — Telephone Encounter (Signed)
FMLA form completed and returned to [facility] via red folder Please charge $50 for completion

## 2016-01-27 NOTE — Telephone Encounter (Signed)
I have placed original at front desk and placed copy to chart and billing.

## 2016-01-31 ENCOUNTER — Other Ambulatory Visit: Payer: Self-pay | Admitting: Internal Medicine

## 2016-02-09 ENCOUNTER — Other Ambulatory Visit: Payer: BLUE CROSS/BLUE SHIELD

## 2016-02-10 ENCOUNTER — Telehealth: Payer: Self-pay | Admitting: *Deleted

## 2016-02-10 DIAGNOSIS — M545 Low back pain, unspecified: Secondary | ICD-10-CM

## 2016-02-10 DIAGNOSIS — M546 Pain in thoracic spine: Secondary | ICD-10-CM

## 2016-02-10 NOTE — Telephone Encounter (Signed)
Patient would like referral to Dr. Sharlet Salina for injections, patient stated that the Neur-surgeons would not see her after reviewing her MRI stated she needed and anesthesiologist  And referred her to Dr. Luan Pulling whom she says has bad reviews and Dr. Sharlet Salina has great reviews.

## 2016-02-10 NOTE — Telephone Encounter (Signed)
Was this completed or received?

## 2016-02-10 NOTE — Telephone Encounter (Signed)
Your referral is in process as requested to Dr Karn Cassis.   Our referral coordinator will call you when the appointment has been made.

## 2016-02-10 NOTE — Telephone Encounter (Signed)
Patient has requested a call from [facility] in reference to her FMLA paper work, she also has questions about the referral to the Geneticist, molecular in [place]. Pt contact [phone]

## 2016-02-13 ENCOUNTER — Encounter: Payer: Self-pay | Admitting: Internal Medicine

## 2016-02-13 NOTE — Telephone Encounter (Signed)
Mychart message sent.

## 2016-02-17 ENCOUNTER — Other Ambulatory Visit: Payer: Self-pay

## 2016-02-17 MED ORDER — CANAGLIFLOZIN 100 MG PO TABS: ORAL_TABLET | ORAL | Status: DC

## 2016-02-17 MED ORDER — ATORVASTATIN CALCIUM 20 MG PO TABS: 20.0000 mg | ORAL_TABLET | Freq: Every day | ORAL | Status: DC

## 2016-02-17 NOTE — Telephone Encounter (Signed)
Medication refills

## 2016-03-03 ENCOUNTER — Other Ambulatory Visit: Payer: Self-pay | Admitting: Internal Medicine

## 2016-03-10 ENCOUNTER — Other Ambulatory Visit: Payer: Self-pay | Admitting: Internal Medicine

## 2016-03-10 NOTE — Telephone Encounter (Signed)
Please advise on refill. Last refill sent in on 02/17/16 says pt needs to see PCP before receiving more refills. No follow up visit on file. Pt last saw Dr. Derrel Nip 12/2015.

## 2016-03-12 ENCOUNTER — Telehealth: Payer: Self-pay

## 2016-03-12 NOTE — Telephone Encounter (Signed)
Refill request from [facility] for Tramadol. Last fill was 01/04/16 #120 with 3 refills.Thank youAzalee Course, RMA

## 2016-03-13 NOTE — Telephone Encounter (Signed)
Denied for 2 reasons  She should have refill left for the month of August. And she did not keep the neurosurgery appointment I made for her, at her request.   She will have to have an appointment prior to any more refills.

## 2016-03-13 NOTE — Telephone Encounter (Signed)
Refilled..  That remark must be old.

## 2016-03-13 NOTE — Telephone Encounter (Signed)
[id number] 

## 2016-03-14 ENCOUNTER — Telehealth: Payer: Self-pay

## 2016-03-14 NOTE — Telephone Encounter (Signed)
PA for Modafinil completed via fax, approved from 02/12/2016-03/13/2017.

## 2016-03-20 MED ORDER — SPIRONOLACTONE 100 MG PO TABS: 100.0000 mg | ORAL_TABLET | Freq: Every day | ORAL | 2 refills | Status: DC

## 2016-03-20 MED ORDER — DIAZEPAM 10 MG PO TABS: 10.0000 mg | ORAL_TABLET | Freq: Every evening | ORAL | 3 refills | Status: DC | PRN

## 2016-03-20 MED ORDER — FUROSEMIDE 20 MG PO TABS: 20.0000 mg | ORAL_TABLET | Freq: Two times a day (BID) | ORAL | 3 refills | Status: DC

## 2016-03-20 NOTE — Telephone Encounter (Signed)
Ok, meds refilled

## 2016-03-20 NOTE — Telephone Encounter (Signed)
Please advise, thanks.

## 2016-03-20 NOTE — Telephone Encounter (Signed)
Spoke with patient. She is not out of Tramadol, mail order pharmacy I guess sends request earlier as to not run out. As far as neurosurgeon she has appointment with Dr Sharlet Salina on 04/02/16, the first appointment that was made with Dr Luan Pulling patient did not want due to bad reviews for that doctor.  She also states that she needs refill on Diazepam-takes it about 1 tablet at bedtime mainly as a muscle relaxer- all due to work related injury she saw you about, she needs this sent to [facility] fill was in January 2017 with 3 refills, last time she filled it was 12/23/15 per patient. She also needs Spironolactone and lasix sent to mail order-last time it was filled in May it was sent in for 30 tablets instead of 90 tablets. Please review-Kristie Jones, RMA

## 2016-03-20 NOTE — Telephone Encounter (Signed)
[id number] 

## 2016-03-20 NOTE — Telephone Encounter (Signed)
Pt cb. She doesn't know what the calls are in regard to. 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 Please cb.

## 2016-03-20 NOTE — Telephone Encounter (Signed)
Pt lvm stating that she missed a call from Anast[place]. She has been out of town. Please return call

## 2016-05-07 ENCOUNTER — Ambulatory Visit: Payer: BLUE CROSS/BLUE SHIELD | Admitting: Physical Therapy

## 2016-05-09 ENCOUNTER — Ambulatory Visit: Payer: BLUE CROSS/BLUE SHIELD | Attending: Physical Medicine and Rehabilitation | Admitting: Physical Therapy

## 2016-05-09 ENCOUNTER — Encounter: Payer: Self-pay | Admitting: Physical Therapy

## 2016-05-09 DIAGNOSIS — M791 Myalgia, unspecified site: Secondary | ICD-10-CM

## 2016-05-09 DIAGNOSIS — G8929 Other chronic pain: Secondary | ICD-10-CM

## 2016-05-09 DIAGNOSIS — M5441 Lumbago with sciatica, right side: Secondary | ICD-10-CM | POA: Diagnosis present

## 2016-05-09 DIAGNOSIS — M5442 Lumbago with sciatica, left side: Secondary | ICD-10-CM | POA: Insufficient documentation

## 2016-05-09 DIAGNOSIS — M6281 Muscle weakness (generalized): Secondary | ICD-10-CM

## 2016-05-09 DIAGNOSIS — R2689 Other abnormalities of gait and mobility: Secondary | ICD-10-CM | POA: Diagnosis present

## 2016-05-10 NOTE — Patient Instructions (Addendum)
Graded movement before the point of pain with six directions of the spine   Proper body mechanics with getting out of a chair to decrease strain on back  Avoid holding your breath when Getting out of the chair:  Scoot to front part of chair chair Heels behind feet, nose over toes  Inhale like you are smelling roses Exhale to stand

## 2016-05-10 NOTE — Therapy (Signed)
[place] MAIN [facility] SERVICES [address] [place], [place], [address] Phone: [phone]   Fax:  [phone]  Physical Therapy Evaluation  Patient Details  Name: Kristie Jones MRN: [id number] Date of Birth: 1955-11-21 Referring Provider: Dr. Sharlet Salina  Encounter Date: 05/09/2016      PT End of Session - 05/10/16 2229    Visit Number 1   Number of Visits 12   Date for PT Re-Evaluation 07/31/16   PT Start Time 0805   PT Stop Time 0900   PT Time Calculation (min) 55 min      Past Medical History:  Diagnosis Date  . Allergy   . Arthritis   . Cancer (HCC)    MULTIPLE SQUAMOUS CELL-WRIST, BUTTOCKS, ABDOMEN, LIP  . Chicken pox   . Complication of anesthesia    PT GETS VERY ANXIOUS PRIOR TO ANESTHESIA AND WILL START JERKING MASK OFF  . Depression   . Diabetes mellitus without complication ([place])   . Endometriosis   . Headache   . Lower extremity edema     Past Surgical History:  Procedure Laterality Date  . ABDOMINAL HYSTERECTOMY    . CYSTOSCOPY     X3  . DIAGNOSTIC LAPAROSCOPY    . MOHS SURGERY    . PAROTIDECTOMY Left 06/20/2015   Procedure: PAROTIDECTOMY;  Surgeon: Margaretha Sheffield, MD;  Location: [facility] ORS;  Service: ENT;  Laterality: Left;    There were no vitals filed for this visit.       Subjective Assessment - 05/10/16 2207    Subjective Pt states CLBP 2/2 a work injury 11/2015 while pushing a 200lb pt in a W/C.  Pt reports pain as "numbness, stabbing weakness" and radiates down anterior / posterior BLE ( R LE constant 10/10). Pain is worst than L 7/10) with sitting, standing, walking, pressing gas pedal Injections are not working.  The pain radiates to R hip, up along R back to bra strap.  Nothing relieves her pain.  Pt current job requires patient handling. heavy lifting, and long hours of standing and being on her feet. Pt is looking for a different job as recommended by her MDs to minimize strain on her body.     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 stress urinary incontinence, urge incontinence, leakage while sleeping.  Wears a pad 1x/ day . Works night shift.   PSYCHOSOCIAL FACTORS: Pt experienced death of mother and pet dog last year.               [facility] PT Assessment - 05/10/16 2207      Assessment   Medical Diagnosis DDD, LBP   Referring Provider Dr. Sharlet Salina     Precautions   Precautions None     Restrictions   Weight Bearing Restrictions No     Balance Screen   Has the patient fallen in the past 6 months No     Prior Function   Level of Independence Independent     Observation/Other Assessments   Other Surveys  --  ODI 82%      Sensation   Light Touch --  decreased RLE along all dermatomes > LLE     Coordination   Gross Motor Movements are Fluid and Coordinated --  chest breathing   Fine Motor Movements are Fluid and Coordinated --  abdominal/pelvic floor straining w/ cue for bowel movement     Squat   Comments guarding/ wincing with training     Sit to Stand   Comments with difficulty on proper technique     Posture/Postural Control   Posture Comments lumbopelvic perturbations with leg movements     AROM   Overall AROM Comments forward flexion ~ 20% p! , rotation B ~ 20% p!, sideflexion ~30% B     Strength   Overall Strength Comments RLE 2/5 hip /knee flexion/ knee ext, LLE 3/5      Palpation   Spinal mobility increased mm tensions paraspinals, upper trap B    Palpation comment tenderness with palpation, guarded, will assess more thoroughly following rleaxation / pain science training at next session  abdomen, back, pelvic mm to be assessed at next session     Bed Mobility   Bed Mobility --     Ambulation/Gait   Gait Pattern --  decreased R  stance phase   Gait velocity 0.8 m/s    Gait Comments antalgic                   [facility] Adult PT Treatment/Exercise - 05/10/16 2207      Therapeutic Activites    Therapeutic Activities --  sit to stand, bending mechanics     Neuro Re-ed    Neuro Re-ed Details  alignment, pain science, graded movement                PT Education - 05/10/16 2229    Education provided Yes   Education Details HEP, POC, anatomy. physiology, goals, pain science, relaxed breathing   Person(s) Educated Patient   Methods Explanation;Demonstration;Tactile cues;Verbal cues   Comprehension Returned demonstration;Verbalized understanding             PT Long Term Goals - 05/10/16 2240      PT LONG TERM GOAL #1   Title Pt will report being to walk 10 min with pain level < 3/10  in order improve QOL.    Time 12   Period Weeks   Status New     PT LONG TERM GOAL #2   Title Pt will demonstrate modifications and proper body mechanics with simulated work tasks in [employer] rto minimize risk for future injuries   Time 12   Period Weeks   Status New     PT LONG TERM GOAL #3   Title Pt will demo proper pelvic floor coordination  without straining in order to optimize intraabdominal pressure for back support and urinary/ bowel function   Time 12   Period Weeks   Status New     PT LONG TERM GOAL #4   Title Pt will report being able to perform household activities with body mechanics and to press on gas pedal without pain   Time 12   Period Weeks   Status New     PT LONG TERM GOAL #5   Title Pt will report decreased SUI and leakage and  less difficulty with emptying urine by 50% in order to perform ADLs   Time 12   Period Weeks   Status New     Additional Long Term Goals   Additional Long Term Goals Yes     PT LONG TERM GOAL #6   Title Pt will demo increased gait speed from 0.8 m/s to > 1.2 m/s in order to ambulate safely in the community and to walk her dogs   Time 12  Period  Weeks   Status New     PT LONG TERM GOAL #7   Title Pt will demo ability to pull 30# for 3 reps with proper alignment and technique to minimize back strain and to pull her dogs.    Time 12   Period Weeks   Status New               Plan - 05/10/16 2230    Clinical Impression Statement Pt is a 60 yo female who c/o CLBP 2/2 a  work related injury that occurred in April 2017 when pushing a 200lb person in a wheel chair. She describes the pain to radiate down BLE anterior/posteriorly and from R hip upward to thoracic level. Pt's clinical presentations showed decreased weakness and sensation on RLE  > LLE, guarded holding patterns with increased mm tensions along parapsinals, poor posture and body mechanics, limited spinal mobility, and poor coordination/ strength of deep core mm. Due to pt's guarded patterns, assessment was limited to a seated position and further spinal/ abdominal/ pelvic assessments will be performed at next session. Aquatic Tx has been withheld due to pt's stress/ urge urinary incontinence conditions. Suspect pt's poor ability to withstand loaded activities such as sitting,  standing, and walking are also influenced by weakness and possible scar restrictions from abdominal surgeries and chronic complications with her bladder and reproductive systems.  Pt was educated on pain science education today in addition to graded movement principles to promote mobility. Pt will continue to benefit from a biopsychosocial approaches and skilled Pelvic Health PT.     Rehab Potential Good   PT Frequency 2x / week   PT Duration 12 weeks   PT Treatment/Interventions ADLs/Self Care Home Management;Aquatic Therapy;Biofeedback;Cryotherapy;Lobbyist;Therapeutic exercise;Therapeutic activities;Functional mobility training;Traction;Moist Heat;Stair training;Gait training;Neuromuscular re-education;Patient/family education;Scar mobilization;Manual lymph drainage;Manual  techniques;Energy conservation;Taping   Consulted and Agree with Plan of Care Patient      Patient will benefit from skilled therapeutic intervention in order to improve the following deficits and impairments:  Abnormal gait, Pain, Improper body mechanics, Impaired sensation, Decreased coordination, Decreased scar mobility, Decreased mobility, Increased muscle spasms, Postural dysfunction, Decreased activity tolerance, Decreased endurance, Decreased range of motion, Decreased strength, Decreased balance, Decreased safety awareness, Difficulty walking, Impaired flexibility, Hypomobility  Visit Diagnosis: Muscle weakness (generalized) - Plan: PT plan of care cert/re-cert  Chronic bilateral low back pain with bilateral sciatica - Plan: PT plan of care cert/re-cert  Other abnormalities of gait and mobility - Plan: PT plan of care cert/re-cert  Myalgia - Plan: PT plan of care cert/re-cert     Problem List Patient Active Problem List   Diagnosis Date Noted  . Left thyroid nodule 01/17/2016  . Hyperlipidemia LDL goal <100 12/31/2015  . Back pain of thoracolumbar region 12/06/2015  . Neck pain on left side 12/06/2015  . Post-operative state 06/20/2015  . Warthin's tumor 05/19/2015  . Tobacco abuse 01/11/2015  . Tobacco abuse counseling 01/11/2015  . Major depressive disorder, recurrent episode, moderate ([place]) 01/10/2015  . Obesity 09/18/2014  . Pain in joint, shoulder region 09/16/2014  . Controlled type 2 diabetes mellitus with diabetic nephropathy ([place]) 06/20/2014  . Sleep disorder, circadian, shift work type 06/20/2014    Jerl Mina ,PT, DPT, E-RYT  05/10/2016, 11:02 PM  [place] MAIN [facility] SERVICES [address] [place], [place], [address] Phone: [phone]   Fax:  [phone]  Name: Kristie Jones MRN: [id number] Date of Birth: 1956-07-16

## 2016-05-14 ENCOUNTER — Ambulatory Visit: Payer: BLUE CROSS/BLUE SHIELD

## 2016-05-14 ENCOUNTER — Encounter: Payer: BLUE CROSS/BLUE SHIELD | Admitting: Physical Therapy

## 2016-05-15 ENCOUNTER — Ambulatory Visit: Payer: BLUE CROSS/BLUE SHIELD | Admitting: Physical Therapy

## 2016-05-15 DIAGNOSIS — R2689 Other abnormalities of gait and mobility: Secondary | ICD-10-CM

## 2016-05-15 DIAGNOSIS — M5441 Lumbago with sciatica, right side: Secondary | ICD-10-CM

## 2016-05-15 DIAGNOSIS — M6281 Muscle weakness (generalized): Secondary | ICD-10-CM

## 2016-05-15 DIAGNOSIS — G8929 Other chronic pain: Secondary | ICD-10-CM

## 2016-05-15 DIAGNOSIS — M791 Myalgia, unspecified site: Secondary | ICD-10-CM

## 2016-05-15 DIAGNOSIS — M5442 Lumbago with sciatica, left side: Secondary | ICD-10-CM

## 2016-05-15 NOTE — Patient Instructions (Addendum)
Open book (handout)  Before the point of pain 15 reps , pillow between knees, and behind back  2 x day    Clam Shell 45 Degrees   Lying with hips and knees bent 45, one pillow between knees and ankles. Lift knee with exhale. Be sure pelvis does not roll backward. Do not arch back. Do 5 times R,  Do 5 times L, 2 times per day.  http://ss.exer.us/75   Copyright  VHI. All rights reserved.   _________________ R  knee bends to chest  with exhale to keep the back of hips looser L sidelying: pillow between the knees  5 reps

## 2016-05-16 NOTE — Therapy (Signed)
[place] MAIN [facility] SERVICES [address] [place], [place], [address] Phone: [phone]   Fax:  [phone]  Physical Therapy Treatment  Patient Details  Name: Kristie Jones MRN: [id number] Date of Birth: 02/02/1956 Referring Provider: Dr. Sharlet Salina  Encounter Date: 05/15/2016      PT End of Session - 05/15/16 1629    Visit Number 2   Number of Visits 12   Date for PT Re-Evaluation 07/31/16   PT Start Time [id number]   PT Stop Time 1630   PT Time Calculation (min) 48 min      Past Medical History:  Diagnosis Date  . Allergy   . Arthritis   . Cancer (HCC)    MULTIPLE SQUAMOUS CELL-WRIST, BUTTOCKS, ABDOMEN, LIP  . Chicken pox   . Complication of anesthesia    PT GETS VERY ANXIOUS PRIOR TO ANESTHESIA AND WILL START JERKING MASK OFF  . Depression   . Diabetes mellitus without complication ([place])   . Endometriosis   . Headache   . Lower extremity edema     Past Surgical History:  Procedure Laterality Date  . ABDOMINAL HYSTERECTOMY    . CYSTOSCOPY     X3  . DIAGNOSTIC LAPAROSCOPY    . MOHS SURGERY    . PAROTIDECTOMY Left 06/20/2015   Procedure: PAROTIDECTOMY;  Surgeon: Margaretha Sheffield, MD;  Location: [facility] ORS;  Service: ENT;  Laterality: Left;    There were no vitals filed for this visit.      Subjective Assessment - 05/15/16 1547    Subjective Pt reported 8/10 pain today on R low back that wraps hips and down the entire R leg.    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 stress urinary incontinence, urge incontinence, leakage while sleeping.  Wears a pad 1x/ day . Works night shift.   PSYCHOSOCIAL FACTORS: Pt experienced death of mother and pet dog last year.               [facility] PT Assessment - 05/16/16 2142       Observation/Other Assessments   Observations sitting with feet under knees without cuing      Other:   Other/ Comments R clam shells 5 reps before fatigue     AROM   Overall AROM Comments forward flexion ~ 60%  , rotation B ~ 45%, sideflexion ~40% B  sidelying:R hip flex: ~60deg (pre-Tx), ~90deg ( PostTx)      Strength   Overall Strength Comments hip abd strength sidelying L 3+/5, R 2/5      Palpation   Spinal mobility decreased mm tensions at thoracic area post-Tx    Palpation comment tenderenss/ tensions along R PSIS (more anterior than L ) along sacrotuberous ligament from S2/ posterior glut                       [facility] Adult PT Treatment/Exercise - 05/16/16 2142      Therapeutic Activites    Therapeutic Activities --  see pt instructions     Manual Therapy   Manual therapy comments RLE long axis, sustained pressure along sacrotuberous, inferior glide of sacrum, MWM with pelvis tilts,/ R hip flexion                PT Education - 05/15/16 1633    Education  provided Yes   Education Details HEP   Person(s) Educated Patient   Methods Explanation;Demonstration;Tactile cues;Verbal cues;Handout   Comprehension Returned demonstration;Verbalized understanding             PT Long Term Goals - 05/15/16 1627      PT LONG TERM GOAL #1   Title Pt will report being to walk 10 min with pain level < 3/10  in order improve QOL.    Time 12   Period Weeks   Status New     PT LONG TERM GOAL #2   Title Pt will demonstrate modifications and proper body mechanics with simulated work tasks in [employer] rto minimize risk for future injuries   Time 12   Period Weeks   Status New     PT LONG TERM GOAL #3   Title Pt will demo proper pelvic floor coordination  without straining in order to optimize intraabdominal pressure for back support and urinary/ bowel function   Time 12   Period Weeks   Status New     PT LONG TERM GOAL #4   Title Pt will report being  able to perform household activities with body mechanics and to press on gas pedal without pain   Time 12   Period Weeks   Status New     PT LONG TERM GOAL #5   Title Pt will report decreased SUI and leakage and  less difficulty with emptying urine by 50% in order to perform ADLs   Time 12   Period Weeks   Status New     PT LONG TERM GOAL #6   Title Pt will demo increased gait speed from 0.8 m/s to > 1.2 m/s in order to ambulate safely in the community and to walk her dogs   Time 12   Period Weeks   Status New     PT LONG TERM GOAL #7   Title Pt will demo ability to pull 20# for 3 reps with proper alignment and technique to minimize back strain and to pull her dogs.    Time 12   Period Weeks   Status New               Plan - 05/15/16 1629    Clinical Impression Statement Pt reported a decrease from 8/10 pain to 6-7/10 following  Tx today. Pt showed increased arthrokinematics in SIJ and increased hip flexion in sidelying following manual Tx. Pt showed good carry over with body mechanics training and showed increased spinal ROM since last session. Pt was educated that Aquatic Tx is being withheld until her urinary dysfunctions are assessed and addressed because urinary incontinence is contraindicated for aquatic Tx. Pt requires specific time for appointments due to her work schedule and PT 's schedule is full at those times. Pt is placed on cancellation list for future appointments.  Pt will continue to benefit from skilled PT.     Rehab Potential Good   Clinical Impairments Affecting Rehab Potential physical demands at her job, co-morbidities, psychosocial factors, chronicity of pain, back injury 2/2 pushing 200lb person in a wheel chair    PT Frequency 2x / week   PT Duration 12 weeks   PT Treatment/Interventions ADLs/Self Care Home Management;Aquatic Therapy;Biofeedback;Cryotherapy;Lobbyist;Therapeutic exercise;Therapeutic activities;Functional  mobility training;Traction;Moist Heat;Stair training;Gait training;Neuromuscular re-education;Patient/family education;Scar mobilization;Manual lymph drainage;Manual techniques;Energy conservation;Taping   Consulted and Agree with Plan of Care Patient      Patient will benefit from skilled therapeutic intervention in order to improve  the following deficits and impairments:  Abnormal gait, Pain, Improper body mechanics, Impaired sensation, Decreased coordination, Decreased scar mobility, Decreased mobility, Increased muscle spasms, Postural dysfunction, Decreased activity tolerance, Decreased endurance, Decreased range of motion, Decreased strength, Decreased balance, Decreased safety awareness, Difficulty walking, Impaired flexibility, Hypomobility  Visit Diagnosis: Muscle weakness (generalized)  Chronic bilateral low back pain with bilateral sciatica  Other abnormalities of gait and mobility  Myalgia     Problem List Patient Active Problem List   Diagnosis Date Noted  . Left thyroid nodule 01/17/2016  . Hyperlipidemia LDL goal <100 12/31/2015  . Back pain of thoracolumbar region 12/06/2015  . Neck pain on left side 12/06/2015  . Post-operative state 06/20/2015  . Warthin's tumor 05/19/2015  . Tobacco abuse 01/11/2015  . Tobacco abuse counseling 01/11/2015  . Major depressive disorder, recurrent episode, moderate ([place]) 01/10/2015  . Obesity 09/18/2014  . Pain in joint, shoulder region 09/16/2014  . Controlled type 2 diabetes mellitus with diabetic nephropathy ([place]) 06/20/2014  . Sleep disorder, circadian, shift work type 06/20/2014    Jerl Mina ,PT, DPT, E-RYT  05/16/2016, 9:47 PM  [place] MAIN [facility] SERVICES [address] [place], [place], [address] Phone: [phone]   Fax:  [phone]  Name: Kristie Jones MRN: [id number] Date of Birth: 02-26-1956

## 2016-05-17 ENCOUNTER — Ambulatory Visit: Payer: BLUE CROSS/BLUE SHIELD | Admitting: Physical Therapy

## 2016-05-22 ENCOUNTER — Ambulatory Visit: Payer: BLUE CROSS/BLUE SHIELD | Admitting: Physical Therapy

## 2016-05-24 ENCOUNTER — Ambulatory Visit: Payer: BLUE CROSS/BLUE SHIELD | Admitting: Physical Therapy

## 2016-05-29 ENCOUNTER — Ambulatory Visit: Payer: BLUE CROSS/BLUE SHIELD | Admitting: Physical Therapy

## 2016-05-31 ENCOUNTER — Ambulatory Visit: Payer: BLUE CROSS/BLUE SHIELD | Admitting: Physical Therapy

## 2016-06-05 ENCOUNTER — Ambulatory Visit: Payer: BLUE CROSS/BLUE SHIELD | Admitting: Physical Therapy

## 2016-06-07 ENCOUNTER — Ambulatory Visit: Payer: BLUE CROSS/BLUE SHIELD | Admitting: Physical Therapy

## 2016-06-12 ENCOUNTER — Ambulatory Visit: Payer: BLUE CROSS/BLUE SHIELD | Admitting: Physical Therapy

## 2016-06-14 ENCOUNTER — Ambulatory Visit: Payer: BLUE CROSS/BLUE SHIELD | Admitting: Physical Therapy

## 2016-06-19 ENCOUNTER — Ambulatory Visit: Payer: BLUE CROSS/BLUE SHIELD | Admitting: Physical Therapy

## 2016-06-21 ENCOUNTER — Ambulatory Visit: Payer: BLUE CROSS/BLUE SHIELD | Admitting: Physical Therapy

## 2016-06-26 ENCOUNTER — Ambulatory Visit: Payer: BLUE CROSS/BLUE SHIELD | Admitting: Physical Therapy

## 2016-07-02 ENCOUNTER — Encounter: Payer: BLUE CROSS/BLUE SHIELD | Admitting: Physical Therapy

## 2016-07-02 ENCOUNTER — Ambulatory Visit (INDEPENDENT_AMBULATORY_CARE_PROVIDER_SITE_OTHER): Payer: BLUE CROSS/BLUE SHIELD | Admitting: Internal Medicine

## 2016-07-02 ENCOUNTER — Encounter: Payer: Self-pay | Admitting: Internal Medicine

## 2016-07-02 VITALS — BP 146/98 | HR 108 | Temp 97.8°F | Resp 14 | Ht 61.0 in | Wt 150.5 lb

## 2016-07-02 DIAGNOSIS — E041 Nontoxic single thyroid nodule: Secondary | ICD-10-CM

## 2016-07-02 DIAGNOSIS — M5431 Sciatica, right side: Secondary | ICD-10-CM | POA: Diagnosis not present

## 2016-07-02 DIAGNOSIS — E1121 Type 2 diabetes mellitus with diabetic nephropathy: Secondary | ICD-10-CM

## 2016-07-02 DIAGNOSIS — M546 Pain in thoracic spine: Secondary | ICD-10-CM

## 2016-07-02 DIAGNOSIS — M545 Low back pain, unspecified: Secondary | ICD-10-CM

## 2016-07-02 DIAGNOSIS — E785 Hyperlipidemia, unspecified: Secondary | ICD-10-CM | POA: Diagnosis not present

## 2016-07-02 DIAGNOSIS — Z760 Encounter for issue of repeat prescription: Secondary | ICD-10-CM

## 2016-07-02 DIAGNOSIS — Z1159 Encounter for screening for other viral diseases: Secondary | ICD-10-CM

## 2016-07-02 DIAGNOSIS — F331 Major depressive disorder, recurrent, moderate: Secondary | ICD-10-CM

## 2016-07-02 MED ORDER — DULOXETINE HCL 20 MG PO CPEP
20.0000 mg | ORAL_CAPSULE | Freq: Every day | ORAL | 0 refills | Status: DC
Start: 2016-07-02 — End: 2016-09-07

## 2016-07-02 NOTE — Patient Instructions (Addendum)
Starting cymbalta at 20 mg daily,  Increase to twice daily after 2 weeks   I will complete the FMLA form for the neurosurgical second opinion   Return in one month

## 2016-07-02 NOTE — Progress Notes (Signed)
Pre-visit discussion using our clinic review tool. No additional management support is needed unless otherwise documented below in the visit note.  

## 2016-07-02 NOTE — Progress Notes (Signed)
Subjective:  Patient ID: Kristie Jones, female    DOB: [birth year]  Age: 60 y.o. MRN: [id number]  CC:  HPI Kristie Jones presents for follow up on multiple issues,  Including Type 2  DM, depression and anxiety, and persistent  low back pain with right sided sciatica following an unauthorized activity at work that resulted in an injury.    She was last seen by me on May 25 and FMLA was signed to allow her to receive treatment by Neurosurgery and by  Pain clinic.  She ended up deferrin Pain clinic and was  referred instead to Dr.  Loistine Chance  Who has treated her with 2 ESI in her lower which gave her transient relief of pain.  He also ordered a thoracic MRI to investigate her complaint of pain which encompassed her entire abdomen starting below both breasts.   At her last visit with him on Nov 15 she requested that he renew her FMLA,  Which he refused to do on the basis of a lack of significant findings on the on either MRI that would warrant continued absence from work . FMLa signed by me in may has run out as of Oct 25th.  She is requesting renewal of FMLA while she seeks a secod opinion form a neurosurgeon and is requesting referral to whatever neurosurgeon comes to [place].   Her last ESI was 10/18 with dye , had to be premedicated with prednisone.  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  She has multiple issues today . She is at times tearful,  At times appears organized with scores of notes she has brought with her, but then provides conflicting information.   She fears returning to work (she has been on vacation since November  15th)  And is   "Afraid to drive."   For unclear reasons.  She missed several days of work since her FMLA ran out,  due to flare ups of her pain and appointments: Nov 1 for the thoracic  MRI,  11/16 for flare up of pain and 11/15 for follow up with [facility].    She states that  due to family stressors her depression  Is worsening.  Wants to start a medication but will  not see a psychiatrist "because it is against my religion."  States that she is seeing a therapist who happens to be  her pastor. [employer]. 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  States that she is seeing her counsellor twice weekly.   Wants to start cymbalta.  Two siblings are taking Cymbalta. Needs fasting labs, include proof of immunity to measles for work     Outpatient Medications Prior to Visit  Medication Sig Dispense Refill  . Aspirin-Acetaminophen-Caffeine (EXCEDRIN MIGRAINE PO) Take by mouth as needed.    Marland Kitchen atorvastatin (LIPITOR) 20 MG tablet Take 1 tablet (20 mg total) by mouth daily. 30 tablet 0  . canagliflozin (INVOKANA) 100 MG TABS tablet Take 1 tablet (100 mg total) by mouth daily before breakfast. 90 tablet 2  . diazepam (VALIUM) 10 MG tablet Take 1 tablet (10 mg total) by mouth at bedtime as needed for anxiety. 30 tablet 3  . furosemide (LASIX) 20 MG tablet Take 1 tablet (20 mg total) by mouth 2 (two) times daily. 180 tablet 3  . [employer] ([facility]  LANCING DEVICE) MISC Check blood sugar once daily.  250.00 100 each 6  . metaxalone (SKELAXIN) 800 MG tablet Take 1 tablet (800 mg total) by mouth 3 (three) times daily. (Patient taking differently: Take 800 mg by mouth 3 (three) times daily as needed. ) 90 tablet 2  . modafinil (PROVIGIL) 200 MG tablet Take 1.5 tablets (300 mg total) by mouth daily. 135 tablet 1  . ondansetron (ZOFRAN-ODT) 4 MG disintegrating tablet Take 1 tablet (4 mg total) by mouth every 8 (eight) hours as needed for nausea or vomiting. 30 tablet 1  . OVER THE COUNTER MEDICATION Take 1 tablet by mouth daily. NUTRI-LITE    . spironolactone (ALDACTONE) 100 MG tablet Take 1 tablet (100 mg total) by mouth daily. 90 tablet 2  . traMADol (ULTRAM) 50 MG tablet Take 2 tablets (100 mg total) by mouth every 6 (six) hours as  needed for severe pain. 120 tablet 3  . gabapentin (NEURONTIN) 100 MG capsule Take 1 capsule (100 mg total) by mouth 3 (three) times daily. 90 capsule 3  . tizanidine (ZANAFLEX) 2 MG capsule Take 2 mg by mouth 3 (three) times daily.     No facility-administered medications prior to visit.     Review of Systems;  Patient denies headache, fevers, malaise, unintentional weight loss, skin rash, eye pain, sinus congestion and sinus pain, sore throat, dysphagia,  hemoptysis , cough, dyspnea, wheezing, chest pain, palpitations, orthopnea, edema, abdominal pain, nausea, melena, diarrhea, constipation, flank pain, dysuria, hematuria, urinary  Frequency, nocturia, numbness, tingling, seizures,  Focal weakness, Loss of consciousness,  Tremor, insomnia,  and suicidal ideation.      Objective:  BP (!) 146/98   Pulse (!) 108   Temp 97.8 F (36.6 C) (Oral)   Resp 14   Ht 5\' 1"  (1.549 m)   Wt 150 lb 8 oz (68.3 kg)   SpO2 98%   BMI 28.44 kg/m   BP Readings from Last 3 Encounters:  07/02/16 (!) 146/98  12/29/15 120/78  12/05/15 130/76    Wt Readings from Last 3 Encounters:  07/02/16 150 lb 8 oz (68.3 kg)  12/29/15 155 lb 4 oz (70.4 kg)  12/05/15 156 lb 4 oz (70.9 kg)    General appearance: alert, cooperative and appears stated age Ears: normal TM's and external ear canals both ears Throat: lips, mucosa, and tongue normal; teeth and gums normal Neck: no adenopathy, no carotid bruit, supple, symmetrical, trachea midline and thyroid not enlarged, symmetric, no tenderness/mass/nodules Back: symmetric, no curvature. ROM normal. No CVA tenderness. Lungs: clear to auscultation bilaterally Heart: regular rate and rhythm, S1, S2 normal, no murmur, click, rub or gallop Abdomen: soft, non-tender; bowel sounds normal; no masses,  no organomegaly Pulses: 2+ and symmetric Skin: Skin color, texture, turgor normal. No rashes or lesions Lymph nodes: Cervical, supraclavicular, and axillary nodes  normal.  Lab Results  Component Value Date   HGBA1C 6.2 12/05/2015   HGBA1C 6.3 09/01/2015   HGBA1C 6.0 01/10/2015    Lab Results  Component Value Date   CREATININE 0.93 12/05/2015   CREATININE 0.91 09/01/2015   CREATININE 0.97 01/10/2015    Lab Results  Component Value Date   GLUCOSE 106 (H) 12/05/2015   CHOL 262 (H) 09/01/2015   TRIG 125.0 09/01/2015   HDL 61.40 09/01/2015   LDLDIRECT 174.0 12/05/2015   LDLCALC 175 (H) 09/01/2015   ALT 15 12/05/2015   AST 17 12/05/2015   NA 139 12/05/2015   K 3.6 12/05/2015   CL  97 12/05/2015   CREATININE 0.93 12/05/2015   BUN 18 12/05/2015   CO2 32 12/05/2015   TSH 1.54 06/17/2014   HGBA1C 6.2 12/05/2015   MICROALBUR 2.1 (H) 09/01/2015    Dg Thoracic Spine W/swimmers  Result Date: 12/05/2015 CLINICAL DATA:  Acute thoracic spine pain after injury at work. EXAM: THORACIC SPINE - 3 VIEWS COMPARISON:  None. FINDINGS: There is no evidence of thoracic spine fracture. Alignment is normal. No other significant bone abnormalities are identified. IMPRESSION: Normal thoracic spine. Electronically Signed   By: Marijo Conception, M.D.   On: 12/05/2015 16:21   Dg Lumbar Spine 2-3 Views  Result Date: 12/05/2015 CLINICAL DATA:  Lumbago with right-sided radicular symptoms for 2 weeks EXAM: LUMBAR SPINE - 2-3 VIEW COMPARISON:  None. FINDINGS: Frontal, lateral, and spot lumbosacral lateral images were obtained. There are 5 non-rib-bearing lumbar type vertebral bodies. There is lower lumbar levoscoliosis. There is no fracture or spondylolisthesis. There is mild disc space narrowing at L4-5. Other disc spaces appear normal. There is an anterior osteophyte along the inferior aspect of the L1 vertebral body. No erosive change. There is atherosclerotic calcification in the aorta. There is a 2 mm calcification to the left of the L3-4 interspace. IMPRESSION: Mild scoliosis. Mild osteoarthritic change. No fracture or spondylolisthesis. Areas of atherosclerotic  calcification aorta. Small calcification on the left, lateral to the L3-4 interspace level of uncertain etiology. Electronically Signed   By: Lowella Grip III M.D.   On: 12/05/2015 16:22    Assessment & Plan:   Problem List Items Addressed This Visit    Back pain of thoracolumbar region    She reports persistent pain with right sided sciatica with no clear signs of foraminal or spinal  stenosis on thoracic and lumbar MRI.  She had only transient relief of pain after her second ESI by Dr Sharlet Salina and is requesting a second opinion from neurosurgery. I have agreed to renew the reqeust for FMLA for appointments only, not for prolonged absences, based on the expert opinion of Dr Sharlet Salina.       Controlled type 2 diabetes mellitus with diabetic nephropathy (HCC)    Historically  well-controlled on  [place] alone  .  hemoglobin A1c has been at goal of less than 7.0  And repeat is due. . Patient is reminded to schedule an annual eye exam and foot exam is normal today. Patient has minimalmicroalbuminuria and will nee d to  start ACE I /ARB. She will also be advised to start statin therapy given her cumulative risk of CAD. Of 25% using FRC  Lab Results  Component Value Date   HGBA1C 6.2 12/05/2015   Lab Results  Component Value Date   MICROALBUR 2.1 (H) 09/01/2015    Lab Results  Component Value Date   CHOL 262 (H) 09/01/2015   HDL 61.40 09/01/2015   LDLCALC 175 (H) 09/01/2015   LDLDIRECT 174.0 12/05/2015   TRIG 125.0 09/01/2015   CHOLHDL 4 09/01/2015            Relevant Orders   Hemoglobin A1c   Comprehensive metabolic panel   Hyperlipidemia LDL goal <100   Relevant Orders   Lipid panel   Left thyroid nodule   Relevant Orders   T4 AND TSH   Major depressive disorder, recurrent episode, moderate (HCC)    Her depression is chronic/recurrent and aggravated by unhappiness at her current job.  She  Has been  resistant to all medications previousuly offered but is now willing  to  initiate Cymbalta. . She refuses to see  Psychiatry. Will start Cymbalta at 20 mg daily, increase to twice daily after one week , goal is 60 mg daily       Relevant Medications   DULoxetine (CYMBALTA) 20 MG capsule    Other Visit Diagnoses    Sciatica of right side    -  Primary   Relevant Medications   DULoxetine (CYMBALTA) 20 MG capsule   Other Relevant Orders   Ambulatory referral to Neurosurgery   Measles screening       Relevant Orders   Measles ([place]) Antibody IgG    A total of 45 minutes was spent with patient more than half of which was spent in counseling patient on the above mentioned issues , reviewing and explaining recent labs and imaging studies done, and coordination of care.  I am having Ms. Dinino start on DULoxetine. I am also having her maintain her metaxalone, [employer] LANCING DEVICE, OVER THE COUNTER MEDICATION, Aspirin-Acetaminophen-Caffeine (EXCEDRIN MIGRAINE PO), ondansetron, gabapentin, modafinil, traMADol, atorvastatin, canagliflozin, diazepam, spironolactone, furosemide, and tizanidine.  Meds ordered this encounter  Medications  . DULoxetine (CYMBALTA) 20 MG capsule    Sig: Take 1 capsule (20 mg total) by mouth daily.    Dispense:  90 capsule    Refill:  0    There are no discontinued medications.  Follow-up: Return in about 4 weeks (around 07/30/2016).   Crecencio Mc, MD

## 2016-07-03 LAB — COMPREHENSIVE METABOLIC PANEL
ALT: 15 U/L (ref 0–35)
AST: 18 U/L (ref 0–37)
Albumin: 4.7 g/dL (ref 3.5–5.2)
Alkaline Phosphatase: 119 U/L — ABNORMAL HIGH (ref 39–117)
BUN: 15 mg/dL (ref 6–23)
CO2: 32 mEq/L (ref 19–32)
Calcium: 10.1 mg/dL (ref 8.4–10.5)
Chloride: 96 mEq/L (ref 96–112)
Creatinine, Ser: 1.03 mg/dL (ref 0.40–1.20)
GFR: 58.48 mL/min — ABNORMAL LOW (ref >60.00)
Glucose, Bld: 98 mg/dL (ref 70–99)
Potassium: 3.9 mEq/L (ref 3.5–5.1)
Sodium: 138 mEq/L (ref 135–145)
Total Bilirubin: 0.4 mg/dL (ref 0.2–1.2)
Total Protein: 7.8 g/dL (ref 6.0–8.3)

## 2016-07-03 LAB — LDL CHOLESTEROL, DIRECT: Direct LDL: 194 mg/dL

## 2016-07-03 LAB — HEMOGLOBIN A1C: Hgb A1c MFr Bld: 6.3 % (ref 4.6–6.5)

## 2016-07-03 LAB — LIPID PANEL
Cholesterol: 289 mg/dL — ABNORMAL HIGH (ref 0–200)
HDL: 61.2 mg/dL (ref >39.00)
NonHDL: 228.17
Total CHOL/HDL Ratio: 5
Triglycerides: 211 mg/dL — ABNORMAL HIGH (ref 0.0–149.0)
VLDL: 42.2 mg/dL — ABNORMAL HIGH (ref 0.0–40.0)

## 2016-07-03 MED ORDER — METAXALONE 800 MG PO TABS: 800.0000 mg | ORAL_TABLET | Freq: Three times a day (TID) | ORAL | 1 refills | Status: DC | PRN

## 2016-07-03 MED ORDER — TRAMADOL HCL 50 MG PO TABS: 100.0000 mg | ORAL_TABLET | Freq: Four times a day (QID) | ORAL | 3 refills | Status: DC | PRN

## 2016-07-03 NOTE — Addendum Note (Signed)
Addended by: Leeanne Rio on: 07/03/2016 11:15 AM   Modules accepted: Orders

## 2016-07-03 NOTE — Assessment & Plan Note (Signed)
Her depression is chronic/recurrent and aggravated by unhappiness at her current job.  She  Has been  resistant to all medications previousuly offered but is now willing to initiate Cymbalta. . She refuses to see  Psychiatry. Will start Cymbalta at 20 mg daily, increase to twice daily after one week , goal is 60 mg daily

## 2016-07-03 NOTE — Assessment & Plan Note (Signed)
Historically  well-controlled on  Invokanna alone  .  hemoglobin A1c has been at goal of less than 7.0  And repeat is due. . Patient is reminded to schedule an annual eye exam and foot exam is normal today. Patient has minimalmicroalbuminuria and will nee d to  start ACE I /ARB. She will also be advised to start statin therapy given her cumulative risk of CAD. Of 25% using FRC  Lab Results  Component Value Date   HGBA1C 6.2 12/05/2015   Lab Results  Component Value Date   MICROALBUR 2.1 (H) 09/01/2015    Lab Results  Component Value Date   CHOL 262 (H) 09/01/2015   HDL 61.40 09/01/2015   LDLCALC 175 (H) 09/01/2015   LDLDIRECT 174.0 12/05/2015   TRIG 125.0 09/01/2015   CHOLHDL 4 09/01/2015

## 2016-07-03 NOTE — Assessment & Plan Note (Addendum)
She reports persistent pain with right sided sciatica with no clear signs of foraminal or spinal  stenosis on thoracic and lumbar MRI.  She had only transient relief of pain after her second ESI by Dr Sharlet Salina and is requesting a second opinion from neurosurgery. I have agreed to renew the reqeust for FMLA for appointments only, not for prolonged absences, based on the expert opinion of Dr Sharlet Salina.

## 2016-07-03 NOTE — Telephone Encounter (Signed)
OK to refill tramadol and Skelaxin ?

## 2016-07-04 ENCOUNTER — Encounter: Payer: Self-pay | Admitting: Internal Medicine

## 2016-07-04 ENCOUNTER — Other Ambulatory Visit: Payer: Self-pay | Admitting: Internal Medicine

## 2016-07-04 LAB — TSH: TSH: 2.09 mIU/L

## 2016-07-04 LAB — RUBEOLA ANTIBODY IGG: Rubeola IgG: >300 AU/mL — ABNORMAL HIGH (ref <25.00)

## 2016-07-04 LAB — T4: T4, Total: 8.3 ug/dL (ref 4.5–12.0)

## 2016-07-04 MED ORDER — ATORVASTATIN CALCIUM 40 MG PO TABS: 20.0000 mg | ORAL_TABLET | Freq: Every day | ORAL | 5 refills | Status: DC

## 2016-07-05 ENCOUNTER — Other Ambulatory Visit: Payer: Self-pay | Admitting: *Deleted

## 2016-07-05 ENCOUNTER — Encounter: Payer: Self-pay | Admitting: Internal Medicine

## 2016-07-05 DIAGNOSIS — Z760 Encounter for issue of repeat prescription: Secondary | ICD-10-CM

## 2016-07-05 MED ORDER — TRAMADOL HCL 50 MG PO TABS: 100.0000 mg | ORAL_TABLET | Freq: Four times a day (QID) | ORAL | 0 refills | Status: DC | PRN

## 2016-07-05 MED ORDER — TRAMADOL HCL 50 MG PO TABS: 100.0000 mg | ORAL_TABLET | Freq: Four times a day (QID) | ORAL | 3 refills | Status: DC | PRN

## 2016-07-05 NOTE — Progress Notes (Unsigned)
[facility]

## 2016-07-06 NOTE — Telephone Encounter (Signed)
Medications faxed as requested

## 2016-08-03 ENCOUNTER — Ambulatory Visit: Payer: BLUE CROSS/BLUE SHIELD | Admitting: Internal Medicine

## 2016-08-27 ENCOUNTER — Telehealth: Payer: Self-pay

## 2016-08-27 DIAGNOSIS — Z760 Encounter for issue of repeat prescription: Secondary | ICD-10-CM

## 2016-08-27 MED ORDER — TRAMADOL HCL 50 MG PO TABS: 100.0000 mg | ORAL_TABLET | Freq: Four times a day (QID) | ORAL | 0 refills | Status: DC | PRN

## 2016-08-27 NOTE — Telephone Encounter (Signed)
Left detailed message about patient need to have an OV prior to anymore refills on Tramadol. Rx faxed to [facility]

## 2016-08-27 NOTE — Telephone Encounter (Signed)
Refill for 45 daysOFFICE VISIT NEEDED prior to any more refills

## 2016-08-27 NOTE — Telephone Encounter (Signed)
Rx refill Tramadol 50 mg LOV: 07/02/2016 Next OV was 09/07/15 but cx, no new appt Last refilled 07/05/16 #[id number] Please advise

## 2016-09-06 ENCOUNTER — Ambulatory Visit: Payer: BLUE CROSS/BLUE SHIELD | Admitting: Internal Medicine

## 2016-09-07 ENCOUNTER — Other Ambulatory Visit: Payer: Self-pay | Admitting: Internal Medicine

## 2016-09-20 ENCOUNTER — Other Ambulatory Visit: Payer: Self-pay | Admitting: *Deleted

## 2016-09-20 MED ORDER — DIAZEPAM 10 MG PO TABS
10.0000 mg | ORAL_TABLET | Freq: Every evening | ORAL | 3 refills | Status: DC | PRN
Start: 2016-09-20 — End: 2017-03-28

## 2016-09-20 NOTE — Telephone Encounter (Signed)
Ok to refill,  Authorized in Insurance risk surveyor. Needs appt in may   Lab Results  Component Value Date   HGBA1C 6.3 07/02/2016

## 2016-09-20 NOTE — Telephone Encounter (Signed)
Could you call pt and schedule OV for May?

## 2016-09-20 NOTE — Telephone Encounter (Signed)
LOV: 07/02/16 Diazepam LF: 08/07/16 No exisiting  Ok to Rf?

## 2016-09-21 NOTE — Telephone Encounter (Signed)
Lm on pt's vm to call office and make an appt in May to see Dr. Derrel Nip.

## 2016-09-21 NOTE — Telephone Encounter (Signed)
Signed Rx faxed to [facility].

## 2016-10-08 ENCOUNTER — Other Ambulatory Visit: Payer: Self-pay | Admitting: Internal Medicine

## 2016-10-18 ENCOUNTER — Other Ambulatory Visit: Payer: Self-pay

## 2016-10-18 DIAGNOSIS — Z760 Encounter for issue of repeat prescription: Secondary | ICD-10-CM

## 2016-10-18 NOTE — Telephone Encounter (Signed)
Refilled 08/27/2016 Last OV: 07/02/2016 Next OV: not scheduled

## 2016-10-19 MED ORDER — TRAMADOL HCL 50 MG PO TABS: 100.0000 mg | ORAL_TABLET | Freq: Four times a day (QID) | ORAL | 1 refills | Status: DC | PRN

## 2016-10-19 MED ORDER — TRAMADOL HCL 50 MG PO TABS: 100.0000 mg | ORAL_TABLET | Freq: Four times a day (QID) | ORAL | 0 refills | Status: DC | PRN

## 2016-10-19 NOTE — Telephone Encounter (Signed)
Ok to refill  Please print the authorized refill for 60 days

## 2016-10-19 NOTE — Telephone Encounter (Signed)
Printed. In quick sign folder.

## 2016-10-28 ENCOUNTER — Other Ambulatory Visit: Payer: Self-pay | Admitting: Internal Medicine

## 2016-10-31 ENCOUNTER — Telehealth: Payer: Self-pay | Admitting: Internal Medicine

## 2016-10-31 ENCOUNTER — Encounter: Payer: Self-pay | Admitting: Internal Medicine

## 2016-10-31 DIAGNOSIS — R300 Dysuria: Secondary | ICD-10-CM

## 2016-10-31 NOTE — Telephone Encounter (Signed)
Pt called and stated that she went to the neurosurgeon as advised buy Dr. Derrel Nip. Neurosurgeon Dr. Deetta Perla stated that he could not help her and that he was going to call Dr. Derrel Nip to order some xray for the hip area. Dr. Lacinda Axon stated that it is a muscualr skeletal issue. Pt has not heard anything and wanted to get an update. Please advise, thank you!  Call pt @ [phone]

## 2016-10-31 NOTE — Telephone Encounter (Signed)
Pt also needs an appt to come in to see Dr. Derrel Nip pt is currently scheduled for 5/16  And wanted to see if there was anyway to get her in sooner. Please advise, thank you!

## 2016-11-01 ENCOUNTER — Telehealth: Payer: Self-pay

## 2016-11-01 DIAGNOSIS — E785 Hyperlipidemia, unspecified: Principal | ICD-10-CM

## 2016-11-01 DIAGNOSIS — E1169 Type 2 diabetes mellitus with other specified complication: Secondary | ICD-10-CM

## 2016-11-01 MED ORDER — CVS LANCING DEVICE MISC: 6 refills | Status: DC

## 2016-11-01 NOTE — Telephone Encounter (Signed)
Left message for patient to return call to office. 

## 2016-11-01 NOTE — Telephone Encounter (Signed)
Medication has been sent.  

## 2016-11-01 NOTE — Telephone Encounter (Signed)
You can use an 11:30 or a 430 on any available day

## 2016-11-01 NOTE — Telephone Encounter (Signed)
Please advise 

## 2016-11-05 NOTE — Telephone Encounter (Signed)
Pt called returning your call. Thank you!  Please call pt @ [phone].

## 2016-11-06 NOTE — Telephone Encounter (Signed)
The earliest patient could come into office was 11/19/16 at 4 pm patient has been scheduled , patient requesting Ua at appointment for polyuria and back pain, patient stated she cannot come in to office earlier for Ua. Patient believes she has irritation or infection from use of K-Y jelly and mate during intercourse.

## 2016-11-06 NOTE — Telephone Encounter (Signed)
Sorry that was just FYI she wanted a Ua at visit will not come in before I tried.

## 2016-11-06 NOTE — Telephone Encounter (Signed)
She can drop off a urine for ua  Micro and culture,  Which I have ordered per usual .  I will not treat based on symptoms .

## 2016-11-08 ENCOUNTER — Telehealth: Payer: Self-pay | Admitting: Internal Medicine

## 2016-11-08 NOTE — Telephone Encounter (Signed)
I have re faxed lab results for MMR and updated flu shot.

## 2016-11-08 NOTE — Telephone Encounter (Signed)
Pt called and stated that her manager still has not received her MMR results. Can we please resend. Pt also stated that she had her flu shot done on 06/05/16 at [facility].  Fax to [phone] Attention [facility] Sleeper

## 2016-11-15 ENCOUNTER — Other Ambulatory Visit: Payer: Self-pay | Admitting: Internal Medicine

## 2016-11-19 ENCOUNTER — Encounter: Payer: Self-pay | Admitting: Internal Medicine

## 2016-11-19 ENCOUNTER — Ambulatory Visit (INDEPENDENT_AMBULATORY_CARE_PROVIDER_SITE_OTHER): Payer: BLUE CROSS/BLUE SHIELD | Admitting: Internal Medicine

## 2016-11-19 DIAGNOSIS — Z1231 Encounter for screening mammogram for malignant neoplasm of breast: Secondary | ICD-10-CM | POA: Diagnosis not present

## 2016-11-19 DIAGNOSIS — E1121 Type 2 diabetes mellitus with diabetic nephropathy: Secondary | ICD-10-CM | POA: Diagnosis not present

## 2016-11-19 DIAGNOSIS — Z90710 Acquired absence of both cervix and uterus: Secondary | ICD-10-CM

## 2016-11-19 DIAGNOSIS — G8929 Other chronic pain: Secondary | ICD-10-CM

## 2016-11-19 DIAGNOSIS — E669 Obesity, unspecified: Secondary | ICD-10-CM

## 2016-11-19 DIAGNOSIS — M5440 Lumbago with sciatica, unspecified side: Secondary | ICD-10-CM | POA: Diagnosis not present

## 2016-11-19 DIAGNOSIS — Z9079 Acquired absence of other genital organ(s): Secondary | ICD-10-CM

## 2016-11-19 DIAGNOSIS — E1169 Type 2 diabetes mellitus with other specified complication: Secondary | ICD-10-CM

## 2016-11-19 DIAGNOSIS — F331 Major depressive disorder, recurrent, moderate: Secondary | ICD-10-CM

## 2016-11-19 DIAGNOSIS — M546 Pain in thoracic spine: Secondary | ICD-10-CM

## 2016-11-19 DIAGNOSIS — E785 Hyperlipidemia, unspecified: Secondary | ICD-10-CM

## 2016-11-19 DIAGNOSIS — M545 Low back pain, unspecified: Secondary | ICD-10-CM

## 2016-11-19 DIAGNOSIS — Z1239 Encounter for other screening for malignant neoplasm of breast: Secondary | ICD-10-CM

## 2016-11-19 DIAGNOSIS — Z90722 Acquired absence of ovaries, bilateral: Secondary | ICD-10-CM

## 2016-11-19 DIAGNOSIS — Z716 Tobacco abuse counseling: Secondary | ICD-10-CM

## 2016-11-19 DIAGNOSIS — G4726 Circadian rhythm sleep disorder, shift work type: Secondary | ICD-10-CM

## 2016-11-19 LAB — POCT GLYCOSYLATED HEMOGLOBIN (HGB A1C): Hemoglobin A1C: 6.2

## 2016-11-19 MED ORDER — MELOXICAM 7.5 MG PO TABS: 7.5000 mg | ORAL_TABLET | Freq: Every day | ORAL | 5 refills | Status: DC

## 2016-11-19 MED ORDER — DULOXETINE HCL 40 MG PO CPEP: 40.0000 mg | ORAL_CAPSULE | Freq: Every day | ORAL | 5 refills | Status: DC

## 2016-11-19 NOTE — Progress Notes (Signed)
Subjective:  Patient ID: Kristie Jones, female    DOB: 12/26/1955  Age: 61 y.o. MRN: [id number]  CC: Diagnoses of Screening breast examination, Controlled type 2 diabetes mellitus with diabetic nephropathy, without long-term current use of insulin ([place]), Chronic right-sided low back pain with sciatica, sciatica laterality unspecified, S/P TAH-BSO (total abdominal hysterectomy and bilateral salpingo-oophorectomy), Hyperlipidemia associated with type 2 diabetes mellitus ([place]), Back pain of thoracolumbar region, Tobacco abuse counseling, Major depressive disorder, recurrent episode, moderate ([place]), Diabetes mellitus type 2 in obese ([place]), and Hyperlipidemia LDL goal <100 were pertinent to this visit.  HPI Kristie Jones presents for FOLLOW UP ON TYPE 2 DM,  Hyperlipidemia, and depression mby chronic pain    She requested a referral to the [facility] in late February  for persistent low back pain with right sided sciatica  that did not improve after ESI in September and October along with aquatic therapy did not improve her pain .  He reviewed her thoracic and lumbar MRIs'; recommended right hip x ray and conrservative management.  She cancelled  her scheduled 3rd injection scheduled bc of the concern about her elevated blood sugars.  Discussed cologuard   Dm:  FASTINGS range from 97 to 126.  Randoms are as low as  70 s often due to long fasting peripds (states that she often doesn't eat from 9 am to 11 pm )  exept for snacks of low carb crackers and string cheese.     Taking cymbalta,  Wants to increase dose to 40 mg daily.  tried taking mobic bc right side of back hurts when she sits on the commode to have a Bm .  Has tried salon pas,  Has not tried  TENS units.   A1c was 6.2   Still smoking.  Discussed smoking cessation,  Has tried  in the past with patches.   Foot exam normal   Needs eye exam   Lab Results  Component Value Date   HGBA1C 6.2 11/19/2016   Lab  Results  Component Value Date   CHOL 171 11/19/2016   HDL 54.70 11/19/2016   LDLCALC 175 (H) 09/01/2015   LDLDIRECT 79.0 11/19/2016   TRIG 201.0 (H) 11/19/2016   CHOLHDL 3 11/19/2016     Outpatient Medications Prior to Visit  Medication Sig Dispense Refill  . Aspirin-Acetaminophen-Caffeine (EXCEDRIN MIGRAINE PO) Take by mouth as needed.    Marland Kitchen atorvastatin (LIPITOR) 40 MG tablet Take 0.5 tablets (20 mg total) by mouth daily. 30 tablet 5  . canagliflozin (INVOKANA) 100 MG TABS tablet Take 1 tablet (100 mg total) by mouth daily before breakfast. 90 tablet 2  . diazepam (VALIUM) 10 MG tablet Take 1 tablet (10 mg total) by mouth at bedtime as needed for anxiety. 30 tablet 3  . furosemide (LASIX) 20 MG tablet Take 1 tablet (20 mg total) by mouth 2 (two) times daily. 180 tablet 3  . gabapentin (NEURONTIN) 100 MG capsule TAKE 1 CAPSULE THREE TIMES A DAY (NEED APPOINTMENT FOR FURTHER REFILLS) 90 capsule 0  . [employer] ([employer] LANCING DEVICE) MISC Check blood sugar once daily.  250.00 100 each 6  . metaxalone (SKELAXIN) 800 MG tablet Take 1 tablet (800 mg total) by mouth 3 (three) times daily as needed. 90 tablet 1  . ondansetron (ZOFRAN-ODT) 4 MG disintegrating tablet Take 1 tablet (4 mg total) by mouth every 8 (eight) hours as needed for nausea or vomiting. 30 tablet 1  . OVER THE COUNTER  MEDICATION Take 1 tablet by mouth daily. NUTRI-LITE    . spironolactone (ALDACTONE) 100 MG tablet Take 1 tablet (100 mg total) by mouth daily. 90 tablet 2  . traMADol (ULTRAM) 50 MG tablet Take 2 tablets (100 mg total) by mouth every 6 (six) hours as needed for severe pain. 360 tablet 1  . DULoxetine (CYMBALTA) 20 MG capsule TAKE (1) CAPSULE BY MOUTH ONCE DAILY. 30 capsule 0  . modafinil (PROVIGIL) 200 MG tablet Take 1.5 tablets (300 mg total) by mouth daily. 135 tablet 1  . tizanidine (ZANAFLEX) 2 MG capsule Take 2 mg by mouth 3 (three) times daily.    . traMADol (ULTRAM) 50 MG tablet Take 2 tablets (100 mg  total) by mouth every 6 (six) hours as needed for severe pain. (Patient not taking: Reported on 11/19/2016) 240 tablet 0   No facility-administered medications prior to visit.     Review of Systems;  Patient denies headache, fevers, malaise, unintentional weight loss, skin rash, eye pain, sinus congestion and sinus pain, sore throat, dysphagia,  hemoptysis , cough, dyspnea, wheezing, chest pain, palpitations, orthopnea, edema, abdominal pain, nausea, melena, diarrhea, constipation, flank pain, dysuria, hematuria, urinary  Frequency, nocturia, numbness, tingling, seizures,  Focal weakness, Loss of consciousness,  Tremor, insomnia, depression, anxiety, and suicidal ideation.      Objective:  BP 116/68   Pulse 95   Temp 98 F (36.7 C) (Oral)   Resp 17   Ht 5\' 1"  (1.549 m)   Wt 152 lb 3.2 oz (69 kg)   SpO2 97%   BMI 28.76 kg/m   BP Readings from Last 3 Encounters:  11/19/16 116/68  07/02/16 (!) 146/98  12/29/15 120/78    Wt Readings from Last 3 Encounters:  11/19/16 152 lb 3.2 oz (69 kg)  07/02/16 150 lb 8 oz (68.3 kg)  12/29/15 155 lb 4 oz (70.4 kg)    General appearance: alert, cooperative and appears stated age Ears: normal TM's and external ear canals both ears Throat: lips, mucosa, and tongue normal; teeth and gums normal Neck: no adenopathy, no carotid bruit, supple, symmetrical, trachea midline and thyroid not enlarged, symmetric, no tenderness/mass/nodules Back: symmetric, no curvature. ROM normal. No CVA tenderness. Lungs: clear to auscultation bilaterally Heart: regular rate and rhythm, S1, S2 normal, no murmur, click, rub or gallop Abdomen: soft, non-tender; bowel sounds normal; no masses,  no organomegaly Pulses: 2+ and symmetric Skin: Skin color, texture, turgor normal. No rashes or lesions Lymph nodes: Cervical, supraclavicular, and axillary nodes normal.  Lab Results  Component Value Date   HGBA1C 6.2 11/19/2016   HGBA1C 6.3 07/02/2016   HGBA1C 6.2  12/05/2015    Lab Results  Component Value Date   CREATININE 0.94 11/19/2016   CREATININE 1.03 07/02/2016   CREATININE 0.93 12/05/2015    Lab Results  Component Value Date   GLUCOSE 95 11/19/2016   CHOL 171 11/19/2016   TRIG 201.0 (H) 11/19/2016   HDL 54.70 11/19/2016   LDLDIRECT 79.0 11/19/2016   LDLCALC 175 (H) 09/01/2015   ALT 20 11/19/2016   AST 20 11/19/2016   NA 139 11/19/2016   K 4.1 11/19/2016   CL 101 11/19/2016   CREATININE 0.94 11/19/2016   BUN 17 11/19/2016   CO2 32 11/19/2016   TSH 2.09 07/03/2016   HGBA1C 6.2 11/19/2016   MICROALBUR <0.7 11/19/2016    Dg Thoracic Spine W/swimmers  Result Date: 12/05/2015 CLINICAL DATA:  Acute thoracic spine pain after injury at work. EXAM: THORACIC SPINE -  3 VIEWS COMPARISON:  None. FINDINGS: There is no evidence of thoracic spine fracture. Alignment is normal. No other significant bone abnormalities are identified. IMPRESSION: Normal thoracic spine. Electronically Signed   By: Marijo Conception, M.D.   On: 12/05/2015 16:21   Dg Lumbar Spine 2-3 Views  Result Date: 12/05/2015 CLINICAL DATA:  Lumbago with right-sided radicular symptoms for 2 weeks EXAM: LUMBAR SPINE - 2-3 VIEW COMPARISON:  None. FINDINGS: Frontal, lateral, and spot lumbosacral lateral images were obtained. There are 5 non-rib-bearing lumbar type vertebral bodies. There is lower lumbar levoscoliosis. There is no fracture or spondylolisthesis. There is mild disc space narrowing at L4-5. Other disc spaces appear normal. There is an anterior osteophyte along the inferior aspect of the L1 vertebral body. No erosive change. There is atherosclerotic calcification in the aorta. There is a 2 mm calcification to the left of the L3-4 interspace. IMPRESSION: Mild scoliosis. Mild osteoarthritic change. No fracture or spondylolisthesis. Areas of atherosclerotic calcification aorta. Small calcification on the left, lateral to the L3-4 interspace level of uncertain etiology.  Electronically Signed   By: Lowella Grip III M.D.   On: 12/05/2015 16:22    Assessment & Plan:   Problem List Items Addressed This Visit    Back pain of thoracolumbar region    Now chronic ,  Continue meloxicam and increase cymbalta to 40 mg daily.  Referral to PT for TENS Unit.       Relevant Medications   meloxicam (MOBIC) 7.5 MG tablet   Controlled type 2 diabetes mellitus ([place])     well-controlled on  [place] alone  .  hemoglobin A1c has been at goal of less than 7.0  . . Patient is reminded to schedule an annual eye exam and foot exam is normal today. She is tolerating  statin therapy given her cumulative risk of CAD. Of 25% using FRC  Lab Results  Component Value Date   HGBA1C 6.2 11/19/2016   Lab Results  Component Value Date   MICROALBUR <0.7 11/19/2016    Lab Results  Component Value Date   CHOL 171 11/19/2016   HDL 54.70 11/19/2016   LDLCALC 175 (H) 09/01/2015   LDLDIRECT 79.0 11/19/2016   TRIG 201.0 (H) 11/19/2016   CHOLHDL 3 11/19/2016            Relevant Orders   POCT HgB A1C (Completed)   Ambulatory referral to Ophthalmology   Microalbumin / creatinine urine ratio (Completed)   Comprehensive metabolic panel (Completed)   Hyperlipidemia LDL goal <100    Untreated risk of CAD is 25% .  Taking atorvastatin. TL 289 LDL 194 prior to treatment  Lab Results  Component Value Date   CHOL 171 11/19/2016   HDL 54.70 11/19/2016   LDLCALC 175 (H) 09/01/2015   LDLDIRECT 79.0 11/19/2016   TRIG 201.0 (H) 11/19/2016   CHOLHDL 3 11/19/2016   Lab Results  Component Value Date   ALT 20 11/19/2016   AST 20 11/19/2016   ALKPHOS 124 (H) 11/19/2016   BILITOT 0.4 11/19/2016         Major depressive disorder, recurrent episode, moderate ([facility])    Her depression is chronic/recurrent and aggravated by unhappiness at her current job.  She  Has been  resistant to all medications but is tolerating Cymbalta. And requesting an increase in dose to 40 mg  . She  refuses to see  Psychiatry.       Relevant Medications   DULoxetine 40 MG CPEP   S/P  TAH-BSO (total abdominal hysterectomy and bilateral salpingo-oophorectomy)   Tobacco abuse counseling    Smoking cessation instruction/counseling given: commended patient for reducing daily use. And encouraged to try quitting using patches       Other Visit Diagnoses    Screening breast examination       Relevant Orders   MM Digital Screening   Chronic right-sided low back pain with sciatica, sciatica laterality unspecified       Relevant Medications   DULoxetine 40 MG CPEP   meloxicam (MOBIC) 7.5 MG tablet   Other Relevant Orders   Ambulatory referral to Physical Therapy   DG HIP UNILAT WITH PELVIS 2-3 VIEWS RIGHT   Hyperlipidemia associated with type 2 diabetes mellitus ([place])       Relevant Orders   Lipid panel (Completed)      I have discontinued Ms. Dredge's tizanidine. I have also changed her DULoxetine to DULoxetine HCl. Additionally, I am having her start on meloxicam. Lastly, I am having her maintain her OVER THE COUNTER MEDICATION, Aspirin-Acetaminophen-Caffeine (EXCEDRIN MIGRAINE PO), ondansetron, modafinil, canagliflozin, spironolactone, furosemide, metaxalone, atorvastatin, diazepam, traMADol, gabapentin, [employer] LANCING DEVICE, and ONETOUCH VERIO.  Meds ordered this encounter  Medications  . ONETOUCH VERIO test strip  . DULoxetine 40 MG CPEP    Sig: Take 40 mg by mouth daily.    Dispense:  30 capsule    Refill:  5  . meloxicam (MOBIC) 7.5 MG tablet    Sig: Take 1 tablet (7.5 mg total) by mouth daily.    Dispense:  30 tablet    Refill:  5    Medications Discontinued During This Encounter  Medication Reason  . tizanidine (ZANAFLEX) 2 MG capsule Patient has not taken in last 30 days  . traMADol (ULTRAM) 50 MG tablet Duplicate  . DULoxetine (CYMBALTA) 20 MG capsule Reorder    Follow-up: Return in about 3 months (around 02/18/2017) for follow up diabetes.   Crecencio Mc, MD

## 2016-11-19 NOTE — Patient Instructions (Addendum)
PT referral for TENS UNIT  mobic 7.5 mg daily     X ray of right hip today  cymbalta dose increased to 40 mg daily   Urine and blood test today for baseline cr

## 2016-11-19 NOTE — Progress Notes (Signed)
Pre visit review using our clinic review tool, if applicable. No additional management support is needed unless otherwise documented below in the visit note. 

## 2016-11-20 ENCOUNTER — Encounter: Payer: Self-pay | Admitting: Internal Medicine

## 2016-11-20 LAB — COMPREHENSIVE METABOLIC PANEL
ALT: 20 U/L (ref 0–35)
AST: 20 U/L (ref 0–37)
Albumin: 4.3 g/dL (ref 3.5–5.2)
Alkaline Phosphatase: 124 U/L — ABNORMAL HIGH (ref 39–117)
BUN: 17 mg/dL (ref 6–23)
CO2: 32 mEq/L (ref 19–32)
Calcium: 9.3 mg/dL (ref 8.4–10.5)
Chloride: 101 mEq/L (ref 96–112)
Creatinine, Ser: 0.94 mg/dL (ref 0.40–1.20)
GFR: 64.9 mL/min (ref >60.00)
Glucose, Bld: 95 mg/dL (ref 70–99)
Potassium: 4.1 mEq/L (ref 3.5–5.1)
Sodium: 139 mEq/L (ref 135–145)
Total Bilirubin: 0.4 mg/dL (ref 0.2–1.2)
Total Protein: 7 g/dL (ref 6.0–8.3)

## 2016-11-20 LAB — MICROALBUMIN / CREATININE URINE RATIO
Creatinine,U: 44.9 mg/dL
Microalb Creat Ratio: 1.6 mg/g (ref 0.0–30.0)
Microalb, Ur: <0.7 mg/dL (ref 0.0–1.9)

## 2016-11-20 LAB — LIPID PANEL
Cholesterol: 171 mg/dL (ref 0–200)
HDL: 54.7 mg/dL (ref >39.00)
NonHDL: 115.81
Total CHOL/HDL Ratio: 3
Triglycerides: 201 mg/dL — ABNORMAL HIGH (ref 0.0–149.0)
VLDL: 40.2 mg/dL — ABNORMAL HIGH (ref 0.0–40.0)

## 2016-11-20 LAB — LDL CHOLESTEROL, DIRECT: Direct LDL: 79 mg/dL

## 2016-11-20 NOTE — Assessment & Plan Note (Signed)
Untreated risk of CAD is 25% .  Taking atorvastatin. TL 289 LDL 194 prior to treatment  Lab Results  Component Value Date   CHOL 171 11/19/2016   HDL 54.70 11/19/2016   LDLCALC 175 (H) 09/01/2015   LDLDIRECT 79.0 11/19/2016   TRIG 201.0 (H) 11/19/2016   CHOLHDL 3 11/19/2016   Lab Results  Component Value Date   ALT 20 11/19/2016   AST 20 11/19/2016   ALKPHOS 124 (H) 11/19/2016   BILITOT 0.4 11/19/2016

## 2016-11-20 NOTE — Assessment & Plan Note (Signed)
Her depression is chronic/recurrent and aggravated by unhappiness at her current job.  She  Has been  resistant to all medications but is tolerating Cymbalta. And requesting an increase in dose to 40 mg  . She refuses to see  Psychiatry.

## 2016-11-20 NOTE — Assessment & Plan Note (Addendum)
well-controlled on  Invokanna alone  .  hemoglobin A1c has been at goal of less than 7.0  . . Patient is reminded to schedule an annual eye exam and foot exam is normal today. She is tolerating  statin therapy given her cumulative risk of CAD. Of 25% using FRC  Lab Results  Component Value Date   HGBA1C 6.2 11/19/2016   Lab Results  Component Value Date   MICROALBUR <0.7 11/19/2016    Lab Results  Component Value Date   CHOL 171 11/19/2016   HDL 54.70 11/19/2016   LDLCALC 175 (H) 09/01/2015   LDLDIRECT 79.0 11/19/2016   TRIG 201.0 (H) 11/19/2016   CHOLHDL 3 11/19/2016

## 2016-11-20 NOTE — Assessment & Plan Note (Addendum)
Now chronic ,  Continue meloxicam prn tramadol/gabapentin, and increase cymbalta to 40 mg daily.  Referral to PT for TENS Unit. Hip x ray ordered per neurosurgery

## 2016-11-20 NOTE — Assessment & Plan Note (Signed)
Smoking cessation instruction/counseling given: commended patient for reducing daily use. And encouraged to try quitting using patches

## 2016-11-20 NOTE — Assessment & Plan Note (Signed)
Previously managed with Provigil

## 2016-12-04 ENCOUNTER — Other Ambulatory Visit: Payer: Self-pay | Admitting: Internal Medicine

## 2016-12-17 ENCOUNTER — Other Ambulatory Visit: Payer: Self-pay

## 2016-12-17 DIAGNOSIS — Z760 Encounter for issue of repeat prescription: Secondary | ICD-10-CM

## 2016-12-17 NOTE — Telephone Encounter (Signed)
Refilled: 10/19/2016 Last OV: 11/19/2016 Next OV: not scheduled

## 2016-12-18 MED ORDER — TRAMADOL HCL 50 MG PO TABS: 100.0000 mg | ORAL_TABLET | Freq: Four times a day (QID) | ORAL | 3 refills | Status: DC | PRN

## 2016-12-18 NOTE — Telephone Encounter (Signed)
REFILLED NEEDS ov IN October

## 2016-12-19 ENCOUNTER — Ambulatory Visit: Payer: BLUE CROSS/BLUE SHIELD | Admitting: Internal Medicine

## 2016-12-19 NOTE — Telephone Encounter (Signed)
Scheduled an appt for the pt in October. Pt is aware of appt date and time.

## 2016-12-19 NOTE — Telephone Encounter (Signed)
Printed, signed and faxed.  

## 2016-12-26 ENCOUNTER — Telehealth: Payer: Self-pay | Admitting: Internal Medicine

## 2016-12-26 NOTE — Telephone Encounter (Signed)
Pharmacy called requesting refills for pt's [facility] VERIO test strip, lancets, and ONETOUCH VERIO test strip. Please advise, thank you!  Pharmacy - [place], [place]

## 2016-12-28 MED ORDER — ONETOUCH VERIO VI STRP: ORAL_STRIP | 1 refills | Status: DC

## 2016-12-28 MED ORDER — CVS LANCING DEVICE MISC: 6 refills | Status: DC

## 2016-12-28 NOTE — Telephone Encounter (Signed)
Rx's have been sent. 

## 2017-01-02 ENCOUNTER — Other Ambulatory Visit: Payer: Self-pay

## 2017-01-02 MED ORDER — DULOXETINE HCL 40 MG PO CPEP: 40.0000 mg | ORAL_CAPSULE | Freq: Every day | ORAL | 1 refills | Status: DC

## 2017-01-04 ENCOUNTER — Other Ambulatory Visit: Payer: Self-pay | Admitting: *Deleted

## 2017-01-04 DIAGNOSIS — R300 Dysuria: Secondary | ICD-10-CM

## 2017-01-04 MED ORDER — MODAFINIL 200 MG PO TABS: 300.0000 mg | ORAL_TABLET | Freq: Every day | ORAL | 1 refills | Status: DC

## 2017-01-04 NOTE — Telephone Encounter (Signed)
Refilled: 01/03/2016 (rx says it's expired in the chart) Last OV: 11/19/2016 Next OV: 05/09/2017  Spoke with pt about the possible UTI. Pt stated that she has some dysuria and odor. Scheduled pt a lab appt.

## 2017-01-04 NOTE — Telephone Encounter (Signed)
[facility]

## 2017-01-04 NOTE — Telephone Encounter (Signed)
Medication Refill requested for : Provigil  Pharmacy:[facility]  Return Contact : [phone]  Patient also requested a call to discuss having order to be checked for a Uti? Pt was tested, however she was only checked for protein back in April.

## 2017-01-10 ENCOUNTER — Other Ambulatory Visit: Payer: BLUE CROSS/BLUE SHIELD

## 2017-01-25 ENCOUNTER — Other Ambulatory Visit: Payer: Self-pay

## 2017-01-25 ENCOUNTER — Telehealth: Payer: Self-pay | Admitting: *Deleted

## 2017-01-25 MED ORDER — ATORVASTATIN CALCIUM 40 MG PO TABS: 40.0000 mg | ORAL_TABLET | Freq: Every day | ORAL | 0 refills | Status: DC

## 2017-01-25 MED ORDER — ATORVASTATIN CALCIUM 40 MG PO TABS: 40.0000 mg | ORAL_TABLET | Freq: Every day | ORAL | 1 refills | Status: DC

## 2017-01-25 NOTE — Telephone Encounter (Signed)
Patient has requested to have a partial script for atorvastatin sent in to [facility]

## 2017-01-25 NOTE — Telephone Encounter (Signed)
Medication was sent to [facility].

## 2017-02-01 ENCOUNTER — Other Ambulatory Visit: Payer: Self-pay | Admitting: Internal Medicine

## 2017-02-01 MED ORDER — ONDANSETRON 8 MG PO TBDP: ORAL_TABLET | ORAL | 3 refills | Status: DC

## 2017-02-01 NOTE — Telephone Encounter (Signed)
[facility],  Will send to local.

## 2017-02-01 NOTE — Telephone Encounter (Signed)
Refill request for Zofran, last seen [id number], last filled [id number].  Please advise.

## 2017-02-18 ENCOUNTER — Other Ambulatory Visit: Payer: Self-pay | Admitting: Internal Medicine

## 2017-03-06 ENCOUNTER — Telehealth: Payer: Self-pay | Admitting: Internal Medicine

## 2017-03-06 NOTE — Telephone Encounter (Signed)
PA created today PA for Modafinil has been completed and approved approval number [id number]. Left message for patient to return call to notify of approval.

## 2017-03-28 ENCOUNTER — Other Ambulatory Visit: Payer: Self-pay | Admitting: Internal Medicine

## 2017-03-28 NOTE — Telephone Encounter (Signed)
Last OV 11/19/2016 Next OV 05/09/2017 Last refill 09/20/2016

## 2017-03-28 NOTE — Telephone Encounter (Signed)
Refilled to visit

## 2017-03-29 ENCOUNTER — Other Ambulatory Visit: Payer: Self-pay | Admitting: Internal Medicine

## 2017-05-01 NOTE — Telephone Encounter (Signed)
Error

## 2017-05-09 ENCOUNTER — Ambulatory Visit: Payer: BLUE CROSS/BLUE SHIELD | Admitting: Internal Medicine

## 2017-05-18 IMAGING — CR DG LUMBAR SPINE 2-3V
1 series · 3 of 3 positions shown · non-contrast
Comparison: None.

CLINICAL DATA: Lumbago with right-sided radicular symptoms for 2
weeks

EXAM:
LUMBAR SPINE - 2-3 VIEW

[Series 1: dg lumbar spine 2-3 views · 0.14mm/px · 3 of 3 slices shown]
[im 1/3]
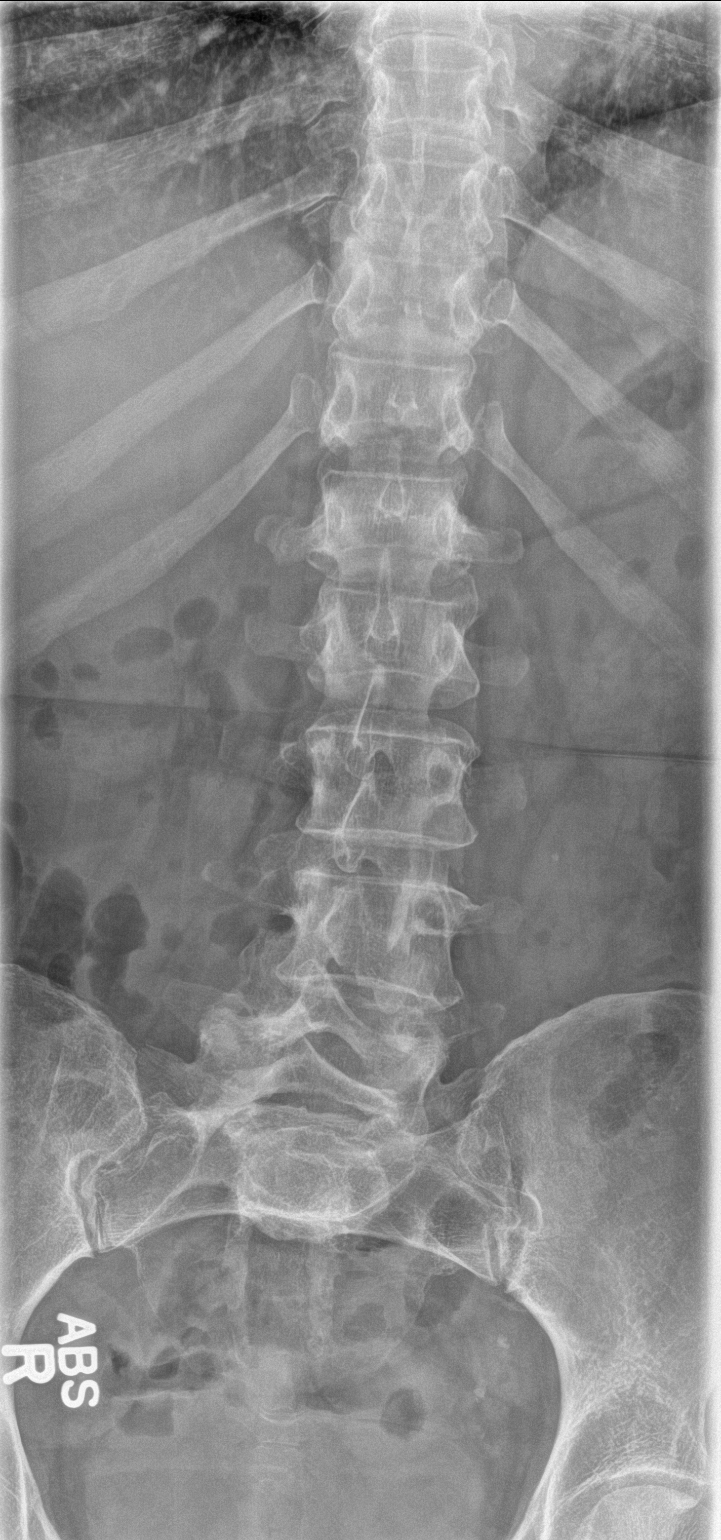
[im 2/3]
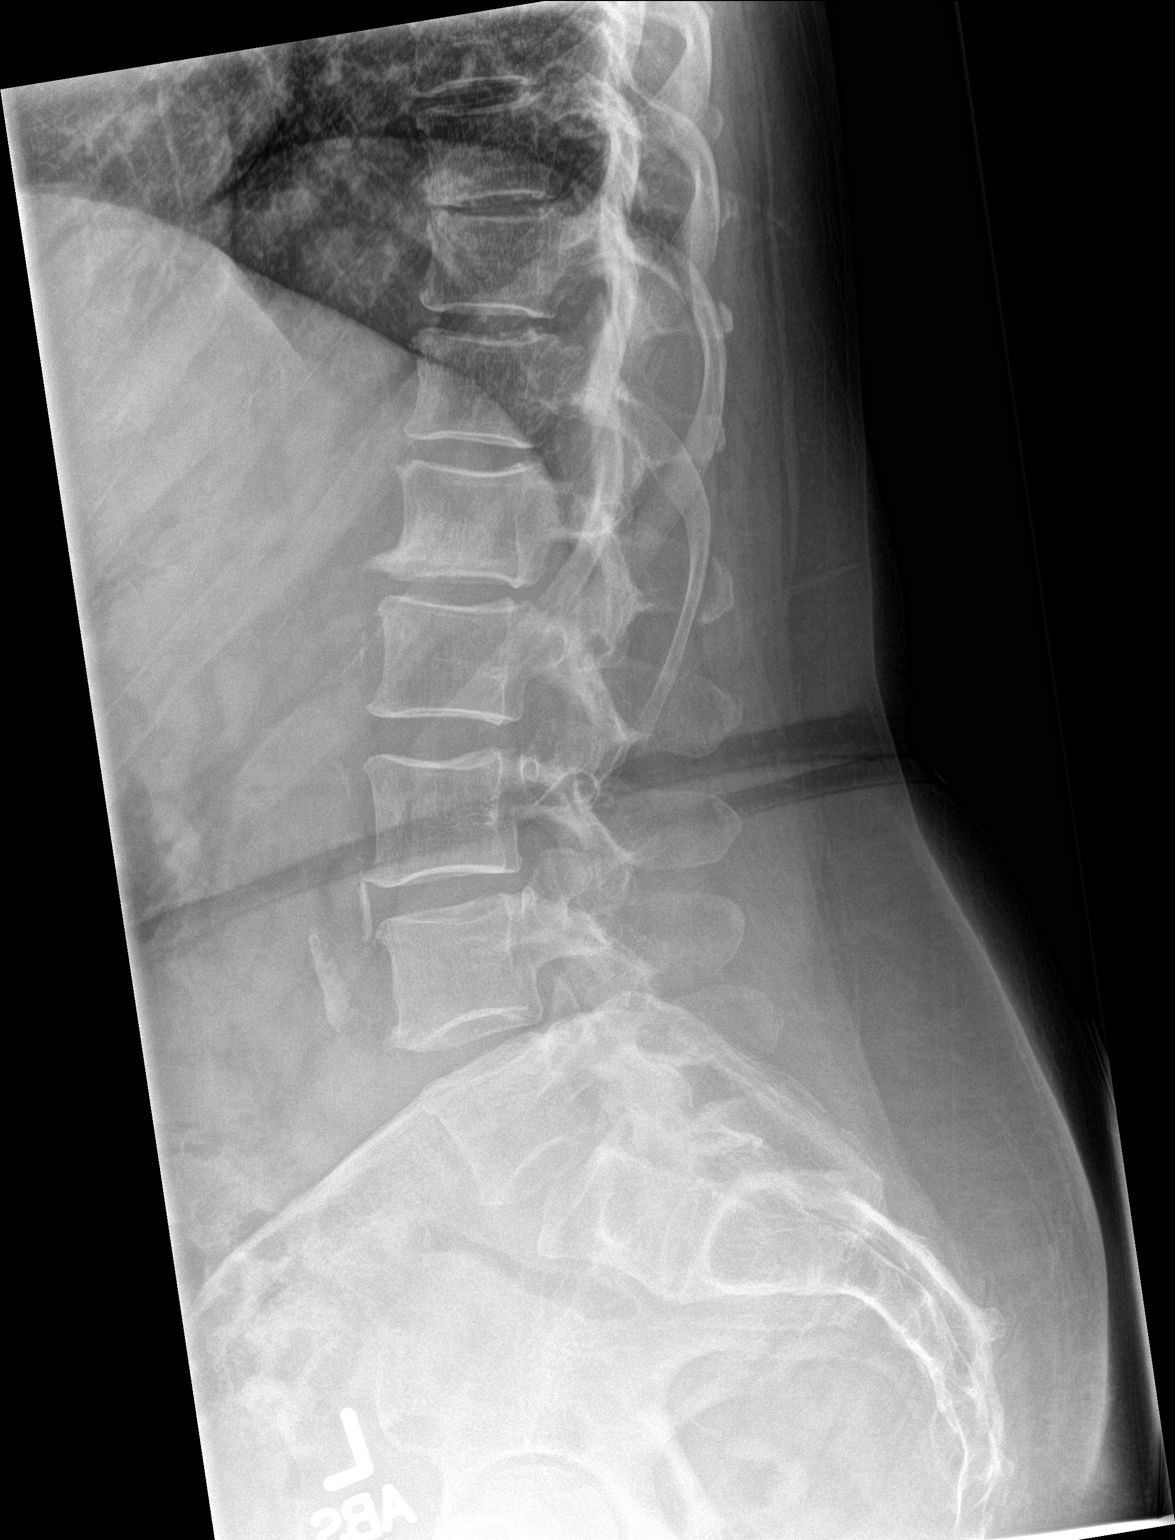
[im 3/3]
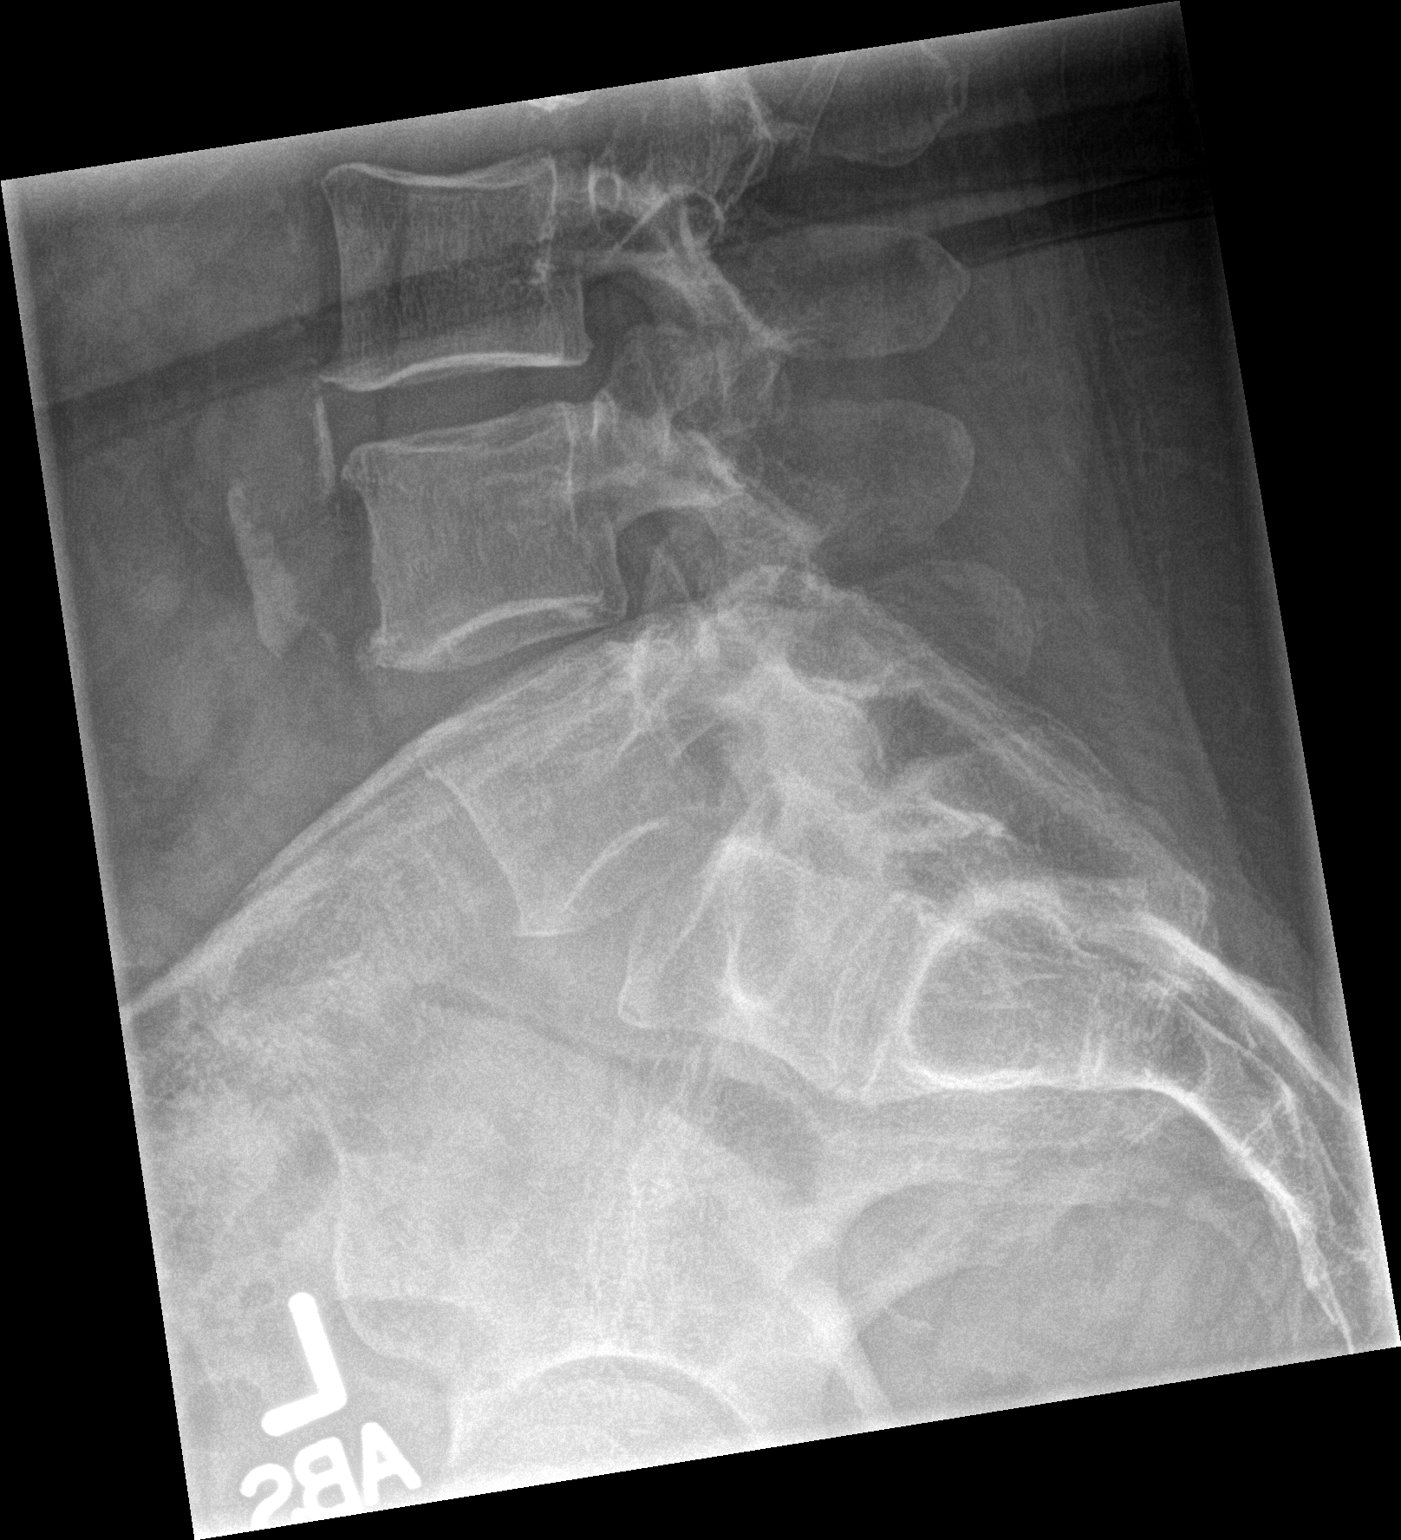

[3 of 3 positions shown; findings below may reference images not displayed]

FINDINGS: Frontal, lateral, and spot lumbosacral lateral images were obtained.
There are 5 non-rib-bearing lumbar type vertebral bodies. There is
lower lumbar levoscoliosis. There is no fracture or
spondylolisthesis. There is mild disc space narrowing at L4-5. Other
disc spaces appear normal. There is an anterior osteophyte along the
inferior aspect of the L1 vertebral body. No erosive change. There
is atherosclerotic calcification in the aorta. There is a 2 mm
calcification to the left of the L3-4 interspace.
IMPRESSION: Mild scoliosis. Mild osteoarthritic change. No fracture or
spondylolisthesis. Areas of atherosclerotic calcification aorta.
Small calcification on the left, lateral to the L3-4 interspace
level of uncertain etiology.

## 2017-06-03 ENCOUNTER — Telehealth: Payer: Self-pay

## 2017-06-03 NOTE — Telephone Encounter (Signed)
Can you please schedule Kristie Jones for an appt on 06/14/2017 at 4:30pm per Dr. Lupita Dawn okay. Kristie Jones is aware of appt date and time.

## 2017-06-03 NOTE — Telephone Encounter (Signed)
Spoke with pt and she is having increased anxiety, and depression. Pt is wanting some help.

## 2017-06-03 NOTE — Telephone Encounter (Signed)
I do not make adjustments of medications over the phone and I do not have an appointment available this week.  She can be added to next week if there is a 11:30, 4:30 or Monday EVENING slot available.  If she feels she needs help prior to that,  She will need to go to the ER or see another provider.

## 2017-06-03 NOTE — Telephone Encounter (Signed)
Pt was advised that if she felt like she couldn't wait until the 9th for any reason then she is to go to the ER or see another provider. The pt stated that she is probably going to go to the ER tonight.

## 2017-06-04 ENCOUNTER — Other Ambulatory Visit: Payer: Self-pay

## 2017-06-04 NOTE — Telephone Encounter (Signed)
Sorry for the misunderstanding. Could you olease schedule the pt for 06/14/2017 @ 4:30pm.

## 2017-06-04 NOTE — Telephone Encounter (Signed)
Refilled: 03/28/2017 Last OV: 11/19/2016 Next OV: not scheduled

## 2017-06-04 NOTE — Telephone Encounter (Signed)
Patient scheduled.

## 2017-06-06 MED ORDER — DIAZEPAM 10 MG PO TABS: ORAL_TABLET | ORAL | 0 refills | Status: DC

## 2017-06-06 NOTE — Telephone Encounter (Signed)
rx signed and faxed

## 2017-06-06 NOTE — Telephone Encounter (Signed)
Refill for 30 days only.  OFFICE VISIT NEEDED prior to any more refills 

## 2017-06-11 NOTE — Telephone Encounter (Signed)
appt scheduled for 06/14/2017

## 2017-06-14 ENCOUNTER — Ambulatory Visit: Payer: BLUE CROSS/BLUE SHIELD | Admitting: Internal Medicine

## 2017-06-14 ENCOUNTER — Encounter: Payer: Self-pay | Admitting: Internal Medicine

## 2017-06-14 ENCOUNTER — Other Ambulatory Visit: Payer: Self-pay

## 2017-06-14 ENCOUNTER — Telehealth: Payer: Self-pay | Admitting: Internal Medicine

## 2017-06-14 VITALS — BP 118/78 | HR 108 | Temp 98.3°F | Resp 16 | Ht 61.0 in | Wt 158.8 lb

## 2017-06-14 DIAGNOSIS — Z630 Problems in relationship with spouse or partner: Secondary | ICD-10-CM | POA: Diagnosis not present

## 2017-06-14 DIAGNOSIS — R300 Dysuria: Secondary | ICD-10-CM | POA: Diagnosis not present

## 2017-06-14 DIAGNOSIS — F411 Generalized anxiety disorder: Secondary | ICD-10-CM

## 2017-06-14 DIAGNOSIS — F331 Major depressive disorder, recurrent, moderate: Secondary | ICD-10-CM | POA: Diagnosis not present

## 2017-06-14 DIAGNOSIS — F32A Depression, unspecified: Secondary | ICD-10-CM

## 2017-06-14 DIAGNOSIS — E1140 Type 2 diabetes mellitus with diabetic neuropathy, unspecified: Secondary | ICD-10-CM | POA: Diagnosis not present

## 2017-06-14 DIAGNOSIS — F329 Major depressive disorder, single episode, unspecified: Secondary | ICD-10-CM

## 2017-06-14 DIAGNOSIS — F419 Anxiety disorder, unspecified: Principal | ICD-10-CM

## 2017-06-14 LAB — POCT URINALYSIS DIPSTICK
Bilirubin, UA: NEGATIVE
Blood, UA: NEGATIVE
Nitrite, UA: POSITIVE
Protein, UA: NEGATIVE
Spec Grav, UA: 1.015 (ref 1.010–1.025)
Urobilinogen, UA: 2 E.U./dL — AB
pH, UA: 6.5 (ref 5.0–8.0)

## 2017-06-14 MED ORDER — DULOXETINE HCL 40 MG PO CPEP: 60.0000 mg | ORAL_CAPSULE | Freq: Every day | ORAL | 2 refills | Status: DC

## 2017-06-14 MED ORDER — BUSPIRONE HCL 7.5 MG PO TABS: 7.5000 mg | ORAL_TABLET | Freq: Three times a day (TID) | ORAL | 3 refills | Status: DC

## 2017-06-14 MED ORDER — FLUCONAZOLE 150 MG PO TABS: 150.0000 mg | ORAL_TABLET | Freq: Every day | ORAL | 0 refills | Status: DC

## 2017-06-14 MED ORDER — SPIRONOLACTONE 100 MG PO TABS: 100.0000 mg | ORAL_TABLET | Freq: Every day | ORAL | 2 refills | Status: DC

## 2017-06-14 MED ORDER — DULOXETINE HCL 60 MG PO CPEP: 60.0000 mg | ORAL_CAPSULE | Freq: Every day | ORAL | 2 refills | Status: DC

## 2017-06-14 NOTE — Telephone Encounter (Signed)
Pharmacist call to clarify dose on Duloxetine.  Stated she rec'd Rx for Duloxetine 40 mg, with sig to take 60 mg qd.  Pharmacist advised the Duloxetine comes in 20 mg, 30 mg, and 60 mg. Caps. Advised that Dr. Derrel Nip documented on office note today to increase Cymbalta to 60 mg qd.  Gave verbal order to pharmacist to change the Rx to Duloxetine 60 mg, take 1 cap qd., #30, RF x 2.  Will update medication profile.

## 2017-06-14 NOTE — Patient Instructions (Addendum)
  I am increasing the cymbalta to 60 mg daily   I am also recommending a trial of buspirone  7.5 mg twice daily for your anxiety .   Save the 3rd dose for an "as needed " treatment of panic attacks or extreme anxiety  Referal to Dr Nicolasa Ducking ,  Psychiatry   Referral to our psychologist who will be starting here in the next 2 weeks  We are collecting urine today,  But I will not know the results for several days    Please schedule a fasting lab appt.  If the symptoms do not resolve with a treatment for candida with fluconazole,  We will need to schedule a pelvic exam
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
## 2017-06-16 DIAGNOSIS — R300 Dysuria: Secondary | ICD-10-CM | POA: Insufficient documentation

## 2017-06-16 NOTE — Assessment & Plan Note (Signed)
Chronic x 6 months.  Urinalysis and culture are pending

## 2017-06-16 NOTE — Assessment & Plan Note (Signed)
Complicated by a histrionic personality disorder. Increase Cymbalta to 60 mg daily  Adding buspirone for anxiety at dose of  7.5 mg bid,  With 3rd dose prn .  Referral to psychology and psychiatry in process.

## 2017-06-26 ENCOUNTER — Other Ambulatory Visit: Payer: Self-pay | Admitting: Internal Medicine

## 2017-07-19 ENCOUNTER — Ambulatory Visit: Payer: 59 | Admitting: Psychology

## 2017-08-01 ENCOUNTER — Other Ambulatory Visit: Payer: Self-pay | Admitting: Internal Medicine

## 2017-08-07 ENCOUNTER — Other Ambulatory Visit: Payer: Self-pay

## 2017-08-07 ENCOUNTER — Other Ambulatory Visit: Payer: Self-pay | Admitting: Internal Medicine

## 2017-08-07 DIAGNOSIS — Z760 Encounter for issue of repeat prescription: Secondary | ICD-10-CM

## 2017-08-07 MED ORDER — TRAMADOL HCL 50 MG PO TABS: 100.0000 mg | ORAL_TABLET | Freq: Four times a day (QID) | ORAL | 3 refills | Status: DC | PRN

## 2017-08-07 MED ORDER — DIAZEPAM 10 MG PO TABS: ORAL_TABLET | ORAL | 0 refills | Status: DC

## 2017-08-07 NOTE — Telephone Encounter (Signed)
Diazepam  Refilled: 06/06/2017 Tramadol  Refilled: 12/18/2016 Last OV: 06/14/2017 Next OV: not scheduled

## 2017-08-08 NOTE — Telephone Encounter (Signed)
Printed, signed and faxed.  

## 2017-09-02 ENCOUNTER — Other Ambulatory Visit: Payer: Self-pay | Admitting: Internal Medicine

## 2017-09-05 DIAGNOSIS — F4312 Post-traumatic stress disorder, chronic: Secondary | ICD-10-CM | POA: Insufficient documentation

## 2017-09-05 DIAGNOSIS — F419 Anxiety disorder, unspecified: Secondary | ICD-10-CM | POA: Insufficient documentation

## 2017-09-05 HISTORY — DX: Post-traumatic stress disorder, chronic: F43.12

## 2017-09-06 ENCOUNTER — Telehealth: Payer: Self-pay

## 2017-09-06 NOTE — Telephone Encounter (Signed)
Copied from [place] [id number]. Topic: Inquiry >> Sep 06, 2017  3:43 PM Oliver Pila B wrote: Reason for CRM: pt called about her FMLA forms, pt is wanting to know if they can be re-done; pt states she has had an appt w/ psychiatrist already and will be seen again on Sunday, and speak about an old medication

## 2017-09-13 NOTE — Telephone Encounter (Signed)
She needs to make an appointment
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
## 2017-09-13 NOTE — Telephone Encounter (Signed)
Patient notified and appointment scheduled. 

## 2017-09-17 ENCOUNTER — Ambulatory Visit: Payer: BLUE CROSS/BLUE SHIELD | Admitting: Psychology

## 2017-09-20 ENCOUNTER — Ambulatory Visit: Payer: 59 | Admitting: Psychology

## 2017-10-07 ENCOUNTER — Other Ambulatory Visit: Payer: Self-pay | Admitting: Internal Medicine

## 2017-10-07 NOTE — Telephone Encounter (Signed)
Refilled: 08/07/2017 Last OV: 06/14/2017 Next OV: 10/25/2017

## 2017-10-25 ENCOUNTER — Encounter: Payer: Self-pay | Admitting: Internal Medicine

## 2017-10-25 ENCOUNTER — Ambulatory Visit (INDEPENDENT_AMBULATORY_CARE_PROVIDER_SITE_OTHER): Payer: BLUE CROSS/BLUE SHIELD | Admitting: Internal Medicine

## 2017-10-25 VITALS — BP 130/68 | HR 107 | Temp 97.9°F | Resp 16 | Ht 61.0 in | Wt 163.2 lb

## 2017-10-25 DIAGNOSIS — M546 Pain in thoracic spine: Secondary | ICD-10-CM

## 2017-10-25 DIAGNOSIS — F331 Major depressive disorder, recurrent, moderate: Secondary | ICD-10-CM

## 2017-10-25 DIAGNOSIS — F4310 Post-traumatic stress disorder, unspecified: Secondary | ICD-10-CM

## 2017-10-25 DIAGNOSIS — F411 Generalized anxiety disorder: Secondary | ICD-10-CM | POA: Diagnosis not present

## 2017-10-25 DIAGNOSIS — M545 Low back pain, unspecified: Secondary | ICD-10-CM

## 2017-10-25 DIAGNOSIS — M5431 Sciatica, right side: Secondary | ICD-10-CM

## 2017-10-25 DIAGNOSIS — E1165 Type 2 diabetes mellitus with hyperglycemia: Secondary | ICD-10-CM | POA: Diagnosis not present

## 2017-10-25 MED ORDER — CANAGLIFLOZIN 100 MG PO TABS: 100.0000 mg | ORAL_TABLET | Freq: Every day | ORAL | 1 refills | Status: DC

## 2017-10-25 MED ORDER — MELOXICAM 7.5 MG PO TABS: 7.5000 mg | ORAL_TABLET | ORAL | 1 refills | Status: DC | PRN

## 2017-10-25 MED ORDER — MODAFINIL 200 MG PO TABS: 300.0000 mg | ORAL_TABLET | Freq: Every day | ORAL | 1 refills | Status: DC

## 2017-10-25 MED ORDER — ONDANSETRON 8 MG PO TBDP: ORAL_TABLET | ORAL | 3 refills | Status: DC

## 2017-10-25 MED ORDER — GABAPENTIN 100 MG PO CAPS: 100.0000 mg | ORAL_CAPSULE | Freq: Three times a day (TID) | ORAL | 1 refills | Status: DC

## 2017-10-25 NOTE — Patient Instructions (Signed)
We will repeat the MRI and make the referral to Dr Aris Lot   I will refill your psychiatric medications until l we can find a  Good alternative relationship
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
## 2017-10-26 LAB — COMPREHENSIVE METABOLIC PANEL
AG Ratio: 1.7 (calc) (ref 1.0–2.5)
ALT: 21 U/L (ref 6–29)
AST: 21 U/L (ref 10–35)
Albumin: 4.7 g/dL (ref 3.6–5.1)
Alkaline phosphatase (APISO): 160 U/L — ABNORMAL HIGH (ref 33–130)
BUN: 18 mg/dL (ref 7–25)
CO2: 30 mmol/L (ref 20–32)
Calcium: 9.8 mg/dL (ref 8.6–10.4)
Chloride: 99 mmol/L (ref 98–110)
Creat: 0.92 mg/dL (ref 0.50–1.05)
Globulin: 2.8 g/dL (calc) (ref 1.9–3.7)
Glucose, Bld: 108 mg/dL — ABNORMAL HIGH (ref 65–99)
Potassium: 4 mmol/L (ref 3.5–5.3)
Sodium: 140 mmol/L (ref 135–146)
Total Bilirubin: 0.5 mg/dL (ref 0.2–1.2)
Total Protein: 7.5 g/dL (ref 6.1–8.1)

## 2017-10-26 LAB — LIPID PANEL
Cholesterol: 156 mg/dL (ref <200)
HDL: 70 mg/dL (ref >50)
LDL Cholesterol (Calc): 70 mg/dL (calc)
Non-HDL Cholesterol (Calc): 86 mg/dL (calc) (ref <130)
Total CHOL/HDL Ratio: 2.2 (calc) (ref <5.0)
Triglycerides: 78 mg/dL (ref <150)

## 2017-10-26 LAB — HEMOGLOBIN A1C
Hgb A1c MFr Bld: 6.2 % of total Hgb — ABNORMAL HIGH (ref <5.7)
Mean Plasma Glucose: 131 (calc)
eAG (mmol/L): 7.3 (calc)

## 2017-10-27 ENCOUNTER — Other Ambulatory Visit: Payer: Self-pay | Admitting: Internal Medicine

## 2017-10-27 DIAGNOSIS — R748 Abnormal levels of other serum enzymes: Secondary | ICD-10-CM

## 2017-10-27 NOTE — Assessment & Plan Note (Signed)
Now chronic ,  Continue meloxicam prn tramadol/gabapentin, repeat MRI needed prior to referral to [facility] Gianotti, eurosurgery

## 2017-10-27 NOTE — Assessment & Plan Note (Signed)
well-controlled on  Invokanna alone  .  hemoglobin A1c has been at goal of less than 7.0  . . Patient is reminded to schedule an annual eye exam and foot exam is normal today. She is tolerating  statin therapy given her cumulative risk of CAD. Of 25% using FRC  Lab Results  Component Value Date   HGBA1C 6.2 (H) 10/25/2017   Lab Results  Component Value Date   MICROALBUR <0.7 11/19/2016    Lab Results  Component Value Date   CHOL 156 10/25/2017   HDL 70 10/25/2017   LDLCALC 70 10/25/2017   LDLDIRECT 79.0 11/19/2016   TRIG 78 10/25/2017   CHOLHDL 2.2 10/25/2017

## 2017-10-27 NOTE — Assessment & Plan Note (Signed)
Complicated by PTSD and anxiety by recent psychiatric evaluation by Dr Johna Roles.  Unfortunately,  She has been released from her care due to incompatibility with schedule availability and requests a referral to another provider. I will continue to fill her prescriptions for Cymbalta, diazepam until she can establish a therapeutic relationship with another psychiatrist.  Encouraged to make an appointment with [place] for psychotherapy.

## 2017-11-01 DIAGNOSIS — Z0279 Encounter for issue of other medical certificate: Secondary | ICD-10-CM

## 2017-11-03 ENCOUNTER — Telehealth: Payer: Self-pay | Admitting: Internal Medicine

## 2017-11-03 NOTE — Telephone Encounter (Signed)
FMLA form completed and retruned in red folder $50 charge

## 2017-11-04 NOTE — Telephone Encounter (Signed)
Placed copy at front for patient to pick up , faxed copy of FMLA as requested, and copy placed for billing.

## 2017-11-11 ENCOUNTER — Other Ambulatory Visit: Payer: Self-pay | Admitting: Internal Medicine

## 2017-11-11 DIAGNOSIS — F32A Depression, unspecified: Secondary | ICD-10-CM

## 2017-11-11 DIAGNOSIS — F419 Anxiety disorder, unspecified: Principal | ICD-10-CM

## 2017-11-11 DIAGNOSIS — F329 Major depressive disorder, single episode, unspecified: Secondary | ICD-10-CM

## 2017-11-11 NOTE — Telephone Encounter (Signed)
Copied from [place]. Topic: Quick Communication - Rx Refill/Question >> Nov 11, 2017 12:10 PM Ahmed Prima L wrote: Medication: DULoxetine (CYMBALTA) 60 MG capsule & DULoxetine (CYMBALTA) 30 MG capsule  (30 day supply) Has the patient contacted their pharmacy? She was seen on 3/22 and the refills were suppose to be sent then. Pharmacy does not have the new scripts. (Agent: If no, request that the patient contact the pharmacy for the refill.) Preferred Pharmacy (with phone number or street name): [place], [place] Agent: Please be advised that RX refills may take up to 3 business days. We ask that you follow-up with your pharmacy.

## 2017-11-11 NOTE — Telephone Encounter (Signed)
Refill of Cymbalta  Historical provider  LOV  10/25/17  Dr. Derrel Nip   [address] - [place], [place]

## 2017-11-11 NOTE — Telephone Encounter (Signed)
Patient called and said it will be this week when she comes to pick up the fmla papers

## 2017-11-12 ENCOUNTER — Encounter: Payer: Self-pay | Admitting: Internal Medicine

## 2017-11-12 DIAGNOSIS — F32A Depression, unspecified: Secondary | ICD-10-CM

## 2017-11-12 DIAGNOSIS — F329 Major depressive disorder, single episode, unspecified: Secondary | ICD-10-CM

## 2017-11-12 DIAGNOSIS — F419 Anxiety disorder, unspecified: Principal | ICD-10-CM

## 2017-11-13 MED ORDER — DULOXETINE HCL 30 MG PO CPEP: 30.0000 mg | ORAL_CAPSULE | Freq: Every day | ORAL | 3 refills | Status: DC

## 2017-11-13 MED ORDER — DULOXETINE HCL 60 MG PO CPEP: 60.0000 mg | ORAL_CAPSULE | Freq: Every day | ORAL | 2 refills | Status: DC

## 2017-11-21 ENCOUNTER — Telehealth: Payer: Self-pay | Admitting: Internal Medicine

## 2017-11-21 DIAGNOSIS — K760 Fatty (change of) liver, not elsewhere classified: Secondary | ICD-10-CM | POA: Insufficient documentation

## 2017-11-21 DIAGNOSIS — R748 Abnormal levels of other serum enzymes: Secondary | ICD-10-CM

## 2017-11-21 NOTE — Telephone Encounter (Signed)
MyChart message sent re abd ultrasound report

## 2017-12-11 ENCOUNTER — Ambulatory Visit: Payer: Managed Care, Other (non HMO)

## 2017-12-22 ENCOUNTER — Other Ambulatory Visit: Payer: Self-pay | Admitting: Internal Medicine

## 2017-12-23 ENCOUNTER — Other Ambulatory Visit: Payer: Self-pay | Admitting: Internal Medicine

## 2017-12-26 NOTE — Telephone Encounter (Signed)
Refilled: 10/07/2017 Last OV: 10/25/2017 Next OV: 01/31/2018

## 2017-12-26 NOTE — Telephone Encounter (Signed)
Printed, signed and faxed.  

## 2018-01-28 ENCOUNTER — Other Ambulatory Visit: Payer: Self-pay | Admitting: Internal Medicine

## 2018-01-31 ENCOUNTER — Ambulatory Visit: Payer: Self-pay | Admitting: Internal Medicine

## 2018-02-11 ENCOUNTER — Other Ambulatory Visit: Payer: Self-pay | Admitting: Internal Medicine

## 2018-02-11 DIAGNOSIS — Z760 Encounter for issue of repeat prescription: Secondary | ICD-10-CM

## 2018-02-12 NOTE — Telephone Encounter (Signed)
Tramadol    Refilled: 08/07/2017  Diazepam    Refilled: 12/26/2017  Last OV: 10/25/2017 Next OV: not scheduled

## 2018-03-07 ENCOUNTER — Other Ambulatory Visit: Payer: Self-pay | Admitting: Internal Medicine

## 2018-03-07 DIAGNOSIS — F32A Depression, unspecified: Secondary | ICD-10-CM

## 2018-03-07 DIAGNOSIS — F419 Anxiety disorder, unspecified: Principal | ICD-10-CM

## 2018-03-07 DIAGNOSIS — F329 Major depressive disorder, single episode, unspecified: Secondary | ICD-10-CM

## 2018-03-11 ENCOUNTER — Other Ambulatory Visit: Payer: Self-pay | Admitting: Internal Medicine

## 2018-03-24 ENCOUNTER — Other Ambulatory Visit: Payer: Self-pay | Admitting: Internal Medicine

## 2018-04-15 ENCOUNTER — Other Ambulatory Visit: Payer: Self-pay

## 2018-04-16 ENCOUNTER — Telehealth: Payer: Self-pay
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
## 2018-04-16 NOTE — Telephone Encounter (Signed)
MyChart message sent  Refusing to  Write letter
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
## 2018-04-17 MED ORDER — DIAZEPAM 10 MG PO TABS: ORAL_TABLET | ORAL | 0 refills | Status: DC

## 2018-04-17 NOTE — Telephone Encounter (Signed)
rx refill Patient last seen on 10-25-17 Last filled on 02-14-18 Next scheduled appt 05-23-18

## 2018-04-22 ENCOUNTER — Other Ambulatory Visit: Payer: Self-pay | Admitting: Internal Medicine

## 2018-04-25 ENCOUNTER — Other Ambulatory Visit: Payer: Self-pay | Admitting: Internal Medicine

## 2018-05-07 ENCOUNTER — Other Ambulatory Visit: Payer: Self-pay | Admitting: Internal Medicine

## 2018-05-08 ENCOUNTER — Other Ambulatory Visit: Payer: Self-pay

## 2018-05-08 ENCOUNTER — Telehealth: Payer: Self-pay

## 2018-05-08 DIAGNOSIS — F32A Depression, unspecified: Secondary | ICD-10-CM

## 2018-05-08 DIAGNOSIS — F419 Anxiety disorder, unspecified: Principal | ICD-10-CM

## 2018-05-08 DIAGNOSIS — F329 Major depressive disorder, single episode, unspecified: Secondary | ICD-10-CM

## 2018-05-08 MED ORDER — DULOXETINE HCL 60 MG PO CPEP: ORAL_CAPSULE | ORAL | 1 refills | Status: DC

## 2018-05-08 MED ORDER — DULOXETINE HCL 30 MG PO CPEP: ORAL_CAPSULE | ORAL | 1 refills | Status: DC
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
## 2018-05-09 NOTE — Telephone Encounter (Signed)
Spoke with pt and pt's appt has been changed to 05/16/2018 at 11:30am. Pt is aware of appt date and time.

## 2018-05-14 ENCOUNTER — Telehealth: Payer: Self-pay | Admitting: *Deleted

## 2018-05-14 NOTE — Telephone Encounter (Signed)
Copied from [place] [id number]. Topic: General - Other >> May 14, 2018  3:06 PM Carolyn Stare wrote:  Pt ask if Janett Billow would give her a call back today. She said she has made a decision not to resign from her job

## 2018-05-15 NOTE — Telephone Encounter (Signed)
Pt returning [place] call no answer at office pt was laying down after appt today and missed call she states that its very important

## 2018-05-15 NOTE — Telephone Encounter (Signed)
Patient called office back

## 2018-05-15 NOTE — Telephone Encounter (Signed)
LMTCB. Please transfer pt to our office.  

## 2018-05-16 ENCOUNTER — Encounter: Payer: Self-pay | Admitting: Internal Medicine

## 2018-05-16 ENCOUNTER — Ambulatory Visit (INDEPENDENT_AMBULATORY_CARE_PROVIDER_SITE_OTHER): Payer: Self-pay | Admitting: Internal Medicine

## 2018-05-16 DIAGNOSIS — M545 Low back pain, unspecified: Secondary | ICD-10-CM

## 2018-05-16 DIAGNOSIS — M47812 Spondylosis without myelopathy or radiculopathy, cervical region: Secondary | ICD-10-CM

## 2018-05-16 DIAGNOSIS — F331 Major depressive disorder, recurrent, moderate: Secondary | ICD-10-CM

## 2018-05-16 DIAGNOSIS — M546 Pain in thoracic spine: Secondary | ICD-10-CM

## 2018-05-16 NOTE — Telephone Encounter (Signed)
Pt was in the office today for an office visit.

## 2018-05-16 NOTE — Progress Notes (Signed)
Subjective:  Patient ID: Kristie Jones, female    DOB: Apr 01, 1956  Age: 62 y.o. MRN: [id number]  CC: There were no encounter diagnoses.  HPI Kristie Jones presents for follow up on  Persistent back pain .  She has been out of work since August 28 with no income due to [employer] [employer] [facility] requiring a  Letter from PCP stating that she was " fit for duty;" I refused to do so without an office visit since I had not seen her in 6 months and there was no background information provided.  She reports that she was not given enough time to apply for a personal LOA, or for  FMLA.  She has decided to resign from [employer] and apply for disability related to her persistent back pain . She feels much better since she has not been working. She has been talking with her pastor for therapy    Outpatient Medications Prior to Visit  Medication Sig Dispense Refill  . Aspirin-Acetaminophen-Caffeine (EXCEDRIN MIGRAINE PO) Take by mouth as needed.    Marland Kitchen atorvastatin (LIPITOR) 40 MG tablet TAKE 1 TABLET DAILY 90 tablet 1  . diazepam (VALIUM) 10 MG tablet 1 tablet daily as needed for anxiety 30 tablet 0  . DULoxetine (CYMBALTA) 30 MG capsule TAKE 1 TABLET BY MOUTH ONCE DAILY. (TO BE TAKEN WITH 60MG  TABLET) 90 capsule 1  . DULoxetine (CYMBALTA) 60 MG capsule TAKE (1) CAPSULE BY MOUTH ONCE DAILY. 90 capsule 1  . furosemide (LASIX) 20 MG tablet TAKE 1 TABLET TWICE A DAY 180 tablet 0  . gabapentin (NEURONTIN) 100 MG capsule Take 1 capsule (100 mg total) by mouth 3 (three) times daily. 180 capsule 1  . INVOKANA 100 MG TABS tablet TAKE 1 TABLET DAILY BEFORE BREAKFAST 90 tablet 4  . loratadine (CLARITIN REDITABS) 10 MG dissolvable tablet Take 10 mg daily by mouth.    . meloxicam (MOBIC) 7.5 MG tablet TAKE 1 TABLET AS NEEDED 90 tablet 4  . modafinil (PROVIGIL) 200 MG tablet Take 1.5 tablets (300 mg total) by mouth daily. 135 tablet 1  . ondansetron (ZOFRAN-ODT) 8 MG disintegrating tablet DISSOLVE 1/2 TABLET ON THE TONGUE EVERY 8 HOURS AS  NEEDED FOR NAUSEA/VOMITING 15 tablet 3  . ONETOUCH DELICA LANCETS FINE MISC CHECK BLOOD SUGAR ONCE DAILY 100 each 6  . ONETOUCH VERIO test strip CHECK SUGAR ONCE DAILY 100 each 1  . OVER THE COUNTER MEDICATION Take 1 tablet by mouth daily. NUTRI-LITE    . pseudoephedrine (SUDAFED) 120 MG 12 hr tablet Take 120 mg 2 (two) times daily by mouth.    . spironolactone (ALDACTONE) 100 MG tablet TAKE 1 TABLET DAILY 90 tablet 1  . traMADol (ULTRAM) 50 MG tablet TAKE (2) TABLETS BY MOUTH EVERY SIX HOURS AS NEEDED FOR SEVERE PAIN. 240 tablet 3   No facility-administered medications prior to visit.     Review of Systems;  Patient denies headache, fevers, malaise, unintentional weight loss, skin rash, eye pain, sinus congestion and sinus pain, sore throat, dysphagia,  hemoptysis , cough, dyspnea, wheezing, chest pain, palpitations, orthopnea, edema, abdominal pain, nausea, melena, diarrhea, constipation, flank pain, dysuria, hematuria, urinary  Frequency, nocturia, numbness, tingling, seizures,  Focal weakness, Loss of consciousness,  Tremor, insomnia, depression, anxiety, and suicidal ideation.      Objective:  BP 136/68 (BP Location: Left Arm, Patient Position: Sitting, Cuff Size: Normal)   Pulse (!) 124   Temp (!) 97.5 F (36.4 C) (Oral)   Resp 17   Ht  5\' 1"  (1.549 m)   Wt 157 lb 6.4 oz (71.4 kg)   SpO2 98%   BMI 29.74 kg/m   BP Readings from Last 3 Encounters:  05/16/18 136/68  10/25/17 130/68  06/14/17 118/78    Wt Readings from Last 3 Encounters:  05/16/18 157 lb 6.4 oz (71.4 kg)  10/25/17 163 lb 3.2 oz (74 kg)  06/14/17 158 lb 12.8 oz (72 kg)    General appearance: alert, cooperative and appears stated age Ears: normal TM's and external ear canals both ears Throat: lips, mucosa, and tongue normal; teeth and gums normal Neck: no adenopathy, no carotid bruit, supple, symmetrical, trachea midline and thyroid not enlarged, symmetric, no tenderness/mass/nodules Back: symmetric, no  curvature. ROM normal. No CVA tenderness. Lungs: clear to auscultation bilaterally Heart: regular rate and rhythm, S1, S2 normal, no murmur, click, rub or gallop Abdomen: soft, non-tender; bowel sounds normal; no masses,  no organomegaly Pulses: 2+ and symmetric Skin: Skin color, texture, turgor normal. No rashes or lesions Lymph nodes: Cervical, supraclavicular, and axillary nodes normal.  Lab Results  Component Value Date   HGBA1C 6.2 (H) 10/25/2017   HGBA1C 6.2 11/19/2016   HGBA1C 6.3 07/02/2016    Lab Results  Component Value Date   CREATININE 0.92 10/25/2017   CREATININE 0.94 11/19/2016   CREATININE 1.03 07/02/2016    Lab Results  Component Value Date   GLUCOSE 108 (H) 10/25/2017   CHOL 156 10/25/2017   TRIG 78 10/25/2017   HDL 70 10/25/2017   LDLDIRECT 79.0 11/19/2016   LDLCALC 70 10/25/2017   ALT 21 10/25/2017   AST 21 10/25/2017   NA 140 10/25/2017   K 4.0 10/25/2017   CL 99 10/25/2017   CREATININE 0.92 10/25/2017   BUN 18 10/25/2017   CO2 30 10/25/2017   TSH 2.09 07/03/2016   HGBA1C 6.2 (H) 10/25/2017   MICROALBUR <0.7 11/19/2016    Dg Thoracic Spine W/swimmers  Result Date: 12/05/2015 CLINICAL DATA:  Acute thoracic spine pain after injury at work. EXAM: THORACIC SPINE - 3 VIEWS COMPARISON:  None. FINDINGS: There is no evidence of thoracic spine fracture. Alignment is normal. No other significant bone abnormalities are identified. IMPRESSION: Normal thoracic spine. Electronically Signed   By: Marijo Conception, M.D.   On: 12/05/2015 16:21   Dg Lumbar Spine 2-3 Views  Result Date: 12/05/2015 CLINICAL DATA:  Lumbago with right-sided radicular symptoms for 2 weeks EXAM: LUMBAR SPINE - 2-3 VIEW COMPARISON:  None. FINDINGS: Frontal, lateral, and spot lumbosacral lateral images were obtained. There are 5 non-rib-bearing lumbar type vertebral bodies. There is lower lumbar levoscoliosis. There is no fracture or spondylolisthesis. There is mild disc space narrowing at  L4-5. Other disc spaces appear normal. There is an anterior osteophyte along the inferior aspect of the L1 vertebral body. No erosive change. There is atherosclerotic calcification in the aorta. There is a 2 mm calcification to the left of the L3-4 interspace. IMPRESSION: Mild scoliosis. Mild osteoarthritic change. No fracture or spondylolisthesis. Areas of atherosclerotic calcification aorta. Small calcification on the left, lateral to the L3-4 interspace level of uncertain etiology. Electronically Signed   By: Lowella Grip III M.D.   On: 12/05/2015 16:22    Assessment & Plan:   Problem List Items Addressed This Visit    None      I am having Kristie Jones maintain her OVER THE COUNTER MEDICATION, Aspirin-Acetaminophen-Caffeine (EXCEDRIN MIGRAINE PO), loratadine, pseudoephedrine, gabapentin, modafinil, ondansetron, ONETOUCH VERIO, ONETOUCH DELICA LANCETS FINE, atorvastatin, traMADol,  spironolactone, furosemide, diazepam, meloxicam, INVOKANA, DULoxetine, and DULoxetine.  No orders of the defined types were placed in this encounter.   There are no discontinued medications.  Follow-up: No follow-ups on file.   Crecencio Mc, MD

## 2018-05-18 DIAGNOSIS — M47812 Spondylosis without myelopathy or radiculopathy, cervical region: Secondary | ICD-10-CM | POA: Insufficient documentation

## 2018-05-18 NOTE — Assessment & Plan Note (Addendum)
Found on April 2019 MRI ordered bv Neurosurgery due to reports of pain and numbness /tingling of arms. Left sided mild foraminal stenosis noted  At multiple levels without nerve root impingement.   No EMG studies done per patient preference,  Conservative mesaures including PT ordered by Ne[facility]urgeyr

## 2018-05-18 NOTE — Assessment & Plan Note (Signed)
Now chronic.  She has been unable to continue working at [employer] in her current duties due to persistent back pain  which has resulted in considerable psychiatric distress. Letter written supporting her decision to resign

## 2018-05-18 NOTE — Assessment & Plan Note (Signed)
Symptoms have improved with  Rest and LOA from work .  She has been referred back to Dr. Nicolasa Ducking

## 2018-05-23 ENCOUNTER — Ambulatory Visit: Payer: Self-pay | Admitting: Internal Medicine

## 2018-05-28 ENCOUNTER — Other Ambulatory Visit: Payer: Self-pay | Admitting: Internal Medicine

## 2018-05-28 NOTE — Telephone Encounter (Signed)
Refilled: 04/17/2018 Last OV: 05/16/2018 Next OV: not scheduled

## 2018-05-29 NOTE — Telephone Encounter (Signed)
refilled 

## 2018-06-21 ENCOUNTER — Other Ambulatory Visit: Payer: Self-pay | Admitting: Internal Medicine

## 2018-06-23 ENCOUNTER — Other Ambulatory Visit: Payer: Self-pay | Admitting: Internal Medicine

## 2018-06-26 ENCOUNTER — Other Ambulatory Visit: Payer: Self-pay | Admitting: Internal Medicine

## 2018-06-26 MED ORDER — SPIRONOLACTONE 100 MG PO TABS: 100.0000 mg | ORAL_TABLET | Freq: Every day | ORAL | 1 refills | Status: DC

## 2018-06-26 NOTE — Telephone Encounter (Signed)
Copied from [place] #[id number]. Topic: General - Other >> Jun 26, 2018 10:14 AM Lennox Solders wrote: Reason for CRM: pt no longer uses [facility] and needs new rx furosemide, spironolactone and provigil #90 w/refills. [facility] [address] in [place], [place]

## 2018-06-26 NOTE — Telephone Encounter (Signed)
Requested medication (s) are due for refill today: yes  Requested medication (s) are on the active medication list: yes    Last refill: 10/25/17 #135  1 refill  Future visit scheduled no  Notes to clinic:not delegated  Requested Prescriptions  Pending Prescriptions Disp Refills   modafinil (PROVIGIL) 200 MG tablet 135 tablet 1    Sig: Take 1.5 tablets (300 mg total) by mouth daily.     Off-Protocol Failed - 06/26/2018  2:30 PM      Failed - Medication not assigned to a protocol, review manually.      Passed - Valid encounter within last 12 months    Recent Outpatient Visits          1 month ago Back pain of thoracolumbar region   [facility] Crecencio Mc, MD   8 months ago Controlled type 2 diabetes mellitus with hyperglycemia, without long-term current use of insulin [facility])   [place] Crecencio Mc, MD   1 year ago Controlled type 2 diabetes mellitus with diabetic neuropathy, without long-term current use of insulin [facility])   [place] Crecencio Mc, MD   1 year ago Screening breast examination   [place] Crecencio Mc, MD   1 year ago Sciatica of right side   [place] Crecencio Mc, MD           Signed Prescriptions Disp Refills   spironolactone (ALDACTONE) 100 MG tablet 90 tablet 1    Sig: Take 1 tablet (100 mg total) by mouth daily.     Cardiovascular: Diuretics - Aldosterone Antagonist Passed - 06/26/2018  2:30 PM      Passed - Cr in normal range and within 360 days    Creat  Date Value Ref Range Status  10/25/2017 0.92 0.50 - 1.05 mg/dL Final    Comment:    For patients >65 years of age, the reference limit for Creatinine is approximately 13% higher for people identified as African-American. .          Passed - K in normal range and within 360 days    Potassium  Date Value Ref Range Status  10/25/2017 4.0 3.5 - 5.3 mmol/L Final         Passed - Na in normal range and within 360 days    Sodium  Date Value Ref Range Status  10/25/2017 140 135 - 146 mmol/L Final         Passed - Last BP in normal range    BP Readings from Last 1 Encounters:  05/16/18 136/68         Passed - Valid encounter within last 6 months    Recent Outpatient Visits          1 month ago Back pain of thoracolumbar region   [facility] Crecencio Mc, MD   8 months ago Controlled type 2 diabetes mellitus with hyperglycemia, without long-term current use of insulin [facility])   [place] Crecencio Mc, MD   1 year ago Controlled type 2 diabetes mellitus with diabetic neuropathy, without long-term current use of insulin ([place])   [facility] Crecencio Mc, MD   1 year ago Screening breast examination   [facility] Crecencio Mc, MD   1 year ago Sciatica of right side   [facility] [place] Crecencio Mc, MD  Refused Prescriptions Disp Refills   furosemide (LASIX) 20 MG tablet 180 tablet 4    Sig: Take 1 tablet (20 mg total) by mouth 2 (two) times daily.     Cardiovascular:  Diuretics - Loop Passed - 06/26/2018  2:30 PM      Passed - K in normal range and within 360 days    Potassium  Date Value Ref Range Status  10/25/2017 4.0 3.5 - 5.3 mmol/L Final         Passed - Ca in normal range and within 360 days    Calcium  Date Value Ref Range Status  10/25/2017 9.8 8.6 - 10.4 mg/dL Final         Passed - Na in normal range and within 360 days    Sodium  Date Value Ref Range Status  10/25/2017 140 135 - 146 mmol/L Final         Passed - Cr in normal range and within 360 days    Creat  Date Value Ref Range Status  10/25/2017 0.92 0.50 - 1.05 mg/dL Final    Comment:    For patients >44 years of age, the reference limit for Creatinine is approximately 13% higher for people identified as African-American. .          Passed -  Last BP in normal range    BP Readings from Last 1 Encounters:  05/16/18 136/68         Passed - Valid encounter within last 6 months    Recent Outpatient Visits          1 month ago Back pain of thoracolumbar region   [facility] Crecencio Mc, MD   8 months ago Controlled type 2 diabetes mellitus with hyperglycemia, without long-term current use of insulin [facility])   [place] Crecencio Mc, MD   1 year ago Controlled type 2 diabetes mellitus with diabetic neuropathy, without long-term current use of insulin ([place])   [place] Crecencio Mc, MD   1 year ago Screening breast examination   [facility]  Crecencio Mc, MD   1 year ago Sciatica of right side   [place] Crecencio Mc, MD

## 2018-06-27 ENCOUNTER — Other Ambulatory Visit: Payer: Self-pay

## 2018-06-27 MED ORDER — MODAFINIL 200 MG PO TABS: 300.0000 mg | ORAL_TABLET | Freq: Every day | ORAL | 1 refills | Status: DC

## 2018-06-27 MED ORDER — SPIRONOLACTONE 100 MG PO TABS: 100.0000 mg | ORAL_TABLET | Freq: Every day | ORAL | 1 refills | Status: DC

## 2018-06-27 MED ORDER — FUROSEMIDE 20 MG PO TABS: 20.0000 mg | ORAL_TABLET | Freq: Two times a day (BID) | ORAL | 4 refills | Status: DC

## 2018-06-27 NOTE — Telephone Encounter (Signed)
Medications have been refilled ?

## 2018-06-30 ENCOUNTER — Other Ambulatory Visit: Payer: Self-pay | Admitting: Internal Medicine

## 2018-06-30 DIAGNOSIS — Z760 Encounter for issue of repeat prescription: Secondary | ICD-10-CM

## 2018-07-27 ENCOUNTER — Other Ambulatory Visit: Payer: Self-pay | Admitting: Internal Medicine

## 2018-08-27 ENCOUNTER — Other Ambulatory Visit: Payer: Self-pay | Admitting: Internal Medicine

## 2018-09-16 ENCOUNTER — Other Ambulatory Visit: Payer: Self-pay | Admitting: Internal Medicine

## 2018-09-16 ENCOUNTER — Telehealth: Payer: Self-pay | Admitting: Internal Medicine

## 2018-09-16 MED ORDER — SITAGLIPTIN PHOSPHATE 100 MG PO TABS: 100.0000 mg | ORAL_TABLET | Freq: Every day | ORAL | 1 refills | Status: DC

## 2018-09-16 NOTE — Telephone Encounter (Signed)
Copied from [place]. Topic: Quick Communication - Rx Refill/Question >> Sep 16, 2018 12:00 PM Rayann Heman wrote: Medication: atorvastatin (LIPITOR) 40 MG tablet [[id number]] INVOKANA 100 MG TABS tablet [[id number]]  pt lost insurance and needs new rx sent over to pharmacy. Pt also states that she can not afford medication. Anything that we can do? Any possible samples? Invokana is 544 with no insurance. Pt is very upset pt states that she is completely out of medication. Please advise    Has the patient contacted their pharmacy? no  Preferred Pharmacy (with phone number or street name):[place], [place] [phone] (Phone) [phone] (Fax)    Agent: Please be advised that RX refills may take up to 3 business days. We ask that you follow-up with your pharmacy.

## 2018-09-16 NOTE — Telephone Encounter (Signed)
We do not have samples of the invokana. Is there anything else that can be sent in the would be a cheaper option for the pt. Pt stated that she has lost her insurance.

## 2018-09-16 NOTE — Telephone Encounter (Signed)
FYI  Spoke with pt and she was very upset. She is having a hard time with things since she resigned from her job and lost her insurance. Pt stated that they "don't have gas money or money to buy groceries". Pt also stated that she is trying to continue taking her medications but is having a hard time affording them. Pt was advised that Dr. Derrel Nip sent in a new medication called Januvia in place of the Invokana in hopes that it is cheaper for her. Also explained to pt that she can try using the Goodrx card to help bring down the cost. Pt stated that she would contact the pharmacy to see how much it would cost her and if the Januvia is still too expensive she will let us know.

## 2018-09-16 NOTE — Telephone Encounter (Signed)
She can try using Good Rx for Januvia,  I sent the rx to her pharmacy [facility])

## 2018-09-23 NOTE — Telephone Encounter (Signed)
Pt states she found the form to get her Januvia covered and cost her $0 through the [employer] Patient Assistance Program. She does state there is a portion Dr. Derrel Nip will have to complete on the form as well and she will get this faxed over to the office once she gets to the pharmacy today to complete her portion of the form. Pt requesting to speak with Janett Billow regarding.

## 2018-09-23 NOTE — Telephone Encounter (Signed)
Spoke with pt and she just wanted to let us know that she had found a form for the [employer] so that she can get the Januvia free of cost as long as she qualifies for it. Explained to pt that as soon as we get the form we will fill out our part and get it faxed over. Pt gave a verbal understanding. Pt also wanted to tell us thank you for telling her about the GoodRx card because it has helped her save a lot of money on her medications.

## 2018-09-25 ENCOUNTER — Other Ambulatory Visit: Payer: Self-pay | Admitting: Internal Medicine

## 2018-10-18 ENCOUNTER — Other Ambulatory Visit: Payer: Self-pay | Admitting: Internal Medicine

## 2018-11-17 ENCOUNTER — Other Ambulatory Visit: Payer: Self-pay | Admitting: Internal Medicine

## 2018-11-17 NOTE — Telephone Encounter (Signed)
Refilled: 05/29/2018 Last OV: 05/16/2018 Next OV: not scheduled

## 2018-11-24 ENCOUNTER — Other Ambulatory Visit: Payer: Self-pay | Admitting: Internal Medicine

## 2018-12-15 ENCOUNTER — Telehealth: Payer: Self-pay | Admitting: Internal Medicine

## 2018-12-15 ENCOUNTER — Encounter: Payer: Self-pay | Admitting: Family Medicine

## 2018-12-15 ENCOUNTER — Telehealth: Payer: Self-pay | Admitting: Family Medicine

## 2018-12-15 ENCOUNTER — Ambulatory Visit (INDEPENDENT_AMBULATORY_CARE_PROVIDER_SITE_OTHER): Payer: BLUE CROSS/BLUE SHIELD | Admitting: Family Medicine

## 2018-12-15 ENCOUNTER — Other Ambulatory Visit: Payer: Self-pay

## 2018-12-15 DIAGNOSIS — R52 Pain, unspecified: Secondary | ICD-10-CM

## 2018-12-15 DIAGNOSIS — Z20822 Contact with and (suspected) exposure to covid-19: Secondary | ICD-10-CM

## 2018-12-15 DIAGNOSIS — R110 Nausea: Secondary | ICD-10-CM

## 2018-12-15 DIAGNOSIS — R059 Cough, unspecified: Secondary | ICD-10-CM

## 2018-12-15 DIAGNOSIS — R05 Cough: Secondary | ICD-10-CM

## 2018-12-15 DIAGNOSIS — R6889 Other general symptoms and signs: Secondary | ICD-10-CM

## 2018-12-15 MED ORDER — ONDANSETRON 4 MG PO TBDP: 4.0000 mg | ORAL_TABLET | Freq: Three times a day (TID) | ORAL | 0 refills | Status: DC | PRN

## 2018-12-15 MED ORDER — CLINDAMYCIN HCL 300 MG PO CAPS: 300.0000 mg | ORAL_CAPSULE | Freq: Three times a day (TID) | ORAL | 0 refills | Status: DC

## 2018-12-15 MED ORDER — HYDROCOD POLST-CPM POLST ER 10-8 MG/5ML PO SUER: 5.0000 mL | Freq: Two times a day (BID) | ORAL | 0 refills | Status: DC | PRN

## 2018-12-15 NOTE — Progress Notes (Signed)
Patient ID: Kristie Jones, female   DOB: 10-27-1955, 63 y.o.   MRN: [id number]    Virtual Visit via Video Note  This visit type was conducted due to national recommendations for restrictions regarding the COVID-19 pandemic (e.g. social distancing).  This format is felt to be most appropriate for this patient at this time.  All issues noted in this document were discussed and addressed.  No physical exam was performed (except for noted visual exam findings with Video Visits).   I connected with Kristie Jones today at  1:40 PM EDT by a video enabled telemedicine application and verified that I am speaking with the correct person using two identifiers. Location patient: home Location provider: [facility] Persons participating in the virtual visit: patient, provider  I discussed the limitations, risks, security and privacy concerns of performing an evaluation and management service by video and the availability of in person appointments. I also discussed with the patient that there may be a patient responsible charge related to this service. The patient expressed understanding and agreed to proceed.  HPI:  I was able to successfully connected via video to Kristie Jones.  Patient and I connected due to complaints of cough, nausea, generalized body aches and overall not feeling well and suspecting she may have COVID-19.  Denies wheezing or inability to catch breath, cough is dry and annoying.  Does report some nausea that makes her not want to eat as much, but took a dose of Zofran this morning which did help.  Husband has similar symptoms and patient is inquiring about COVID-19 testing.  Denies vomiting or diarrhea.  Denies chest pain or palpitations.  ROS: See pertinent positives and negatives per HPI.  Past Medical History:  Diagnosis Date  . Allergy   . Arthritis   . Cancer (HCC)    MULTIPLE SQUAMOUS CELL-WRIST, BUTTOCKS, ABDOMEN, LIP  . Chicken pox   . Complication of anesthesia    PT GETS VERY ANXIOUS PRIOR TO ANESTHESIA AND WILL START JERKING MASK OFF  . Depression   . Diabetes mellitus without complication ([place])   . Endometriosis   . Headache   . Lower extremity edema     Past Surgical History:  Procedure Laterality Date  . ABDOMINAL HYSTERECTOMY    . CYSTOSCOPY     X3  . DIAGNOSTIC LAPAROSCOPY    . MOHS SURGERY    . PAROTIDECTOMY Left 06/20/2015   Procedure: PAROTIDECTOMY;  Surgeon: Margaretha Sheffield, MD;  Location: [facility] ORS;  Service: ENT;  Laterality: Left;    Family History  Problem Relation Age of Onset  . Arthritis Mother   . Hypertension Mother    Social History   Tobacco Use  . Smoking status: Current Every Day Smoker    Packs/day: 0.50    Years: 20.00    Pack years: 10.00    Types: Cigarettes  . Smokeless tobacco: Never Used  Substance Use Topics  . Alcohol use: Yes    Alcohol/week: 0.0 standard drinks    Comment: Rarely     Current Outpatient Medications:  .  Aspirin-Acetaminophen-Caffeine (EXCEDRIN MIGRAINE PO), Take by mouth as needed., Disp: , Rfl:  .  atorvastatin (LIPITOR) 40 MG tablet, TAKE 1 TABLET BY MOUTH ONCE DAILY., Disp: 90 tablet, Rfl: 0 .  chlorpheniramine-HYDROcodone (TUSSIONEX PENNKINETIC ER) 10-8 MG/5ML SUER, Take 5 mLs by mouth every 12 (twelve) hours as needed for cough., Disp: 240 mL, Rfl: 0 .  clindamycin (CLEOCIN) 300 MG capsule, Take 1 capsule (300 mg total) by  mouth 3 (three) times daily., Disp: 30 capsule, Rfl: 0 .  diazepam (VALIUM) 10 MG tablet, TAKE 1 TABLET AT BEDTIME AS NEEDED FOR ANXIETY, Disp: 30 tablet, Rfl: 0 .  DULoxetine (CYMBALTA) 30 MG capsule, TAKE 1 TABLET BY MOUTH ONCE DAILY. (TO BE TAKEN WITH 60MG  TABLET), Disp: 30 capsule, Rfl: 0 .  DULoxetine (CYMBALTA) 60 MG capsule, TAKE (1) CAPSULE BY MOUTH ONCE DAILY., Disp: 90 capsule, Rfl: 1 .  furosemide (LASIX) 20 MG tablet, Take 1 tablet (20 mg total) by mouth 2 (two) times daily., Disp: 180 tablet, Rfl: 4 .  gabapentin (NEURONTIN) 100 MG capsule, Take  1 capsule (100 mg total) by mouth 3 (three) times daily., Disp: 180 capsule, Rfl: 1 .  INVOKANA 100 MG TABS tablet, TAKE 1 TABLET DAILY BEFORE BREAKFAST, Disp: 90 tablet, Rfl: 4 .  loratadine (CLARITIN REDITABS) 10 MG dissolvable tablet, Take 10 mg daily by mouth., Disp: , Rfl:  .  meloxicam (MOBIC) 7.5 MG tablet, TAKE 1 TABLET AS NEEDED, Disp: 90 tablet, Rfl: 4 .  modafinil (PROVIGIL) 200 MG tablet, Take 1.5 tablets (300 mg total) by mouth daily., Disp: 135 tablet, Rfl: 1 .  ondansetron (ZOFRAN ODT) 4 MG disintegrating tablet, Take 1 tablet (4 mg total) by mouth every 8 (eight) hours as needed for nausea or vomiting., Disp: 30 tablet, Rfl: 0 .  ONETOUCH DELICA LANCETS FINE MISC, CHECK BLOOD SUGAR ONCE DAILY, Disp: 100 each, Rfl: 6 .  ONETOUCH VERIO test strip, CHECK SUGAR ONCE DAILY, Disp: 100 each, Rfl: 3 .  OVER THE COUNTER MEDICATION, Take 1 tablet by mouth daily. NUTRI-LITE, Disp: , Rfl:  .  pseudoephedrine (SUDAFED) 120 MG 12 hr tablet, Take 120 mg 2 (two) times daily by mouth., Disp: , Rfl:  .  sitaGLIPtin (JANUVIA) 100 MG tablet, Take 1 tablet (100 mg total) by mouth daily., Disp: 30 tablet, Rfl: 1 .  spironolactone (ALDACTONE) 100 MG tablet, TAKE 1 TABLET BY MOUTH ONCE DAILY., Disp: 90 tablet, Rfl: 0 .  traMADol (ULTRAM) 50 MG tablet, TAKE (2) TABLETS BY MOUTH EVERY SIX HOURS AS NEEDED FOR SEVERE PAIN., Disp: 240 tablet, Rfl: 0  EXAM:  GENERAL: alert, oriented, appears well and in no acute distress  HEENT: atraumatic, conjunttiva clear, no obvious abnormalities on inspection of external nose and ears  NECK: normal movements of the head and neck  LUNGS: on inspection no signs of respiratory distress, breathing rate appears normal, no obvious gross SOB, gasping or wheezing  CV: no obvious cyanosis  MS: moves all visible extremities without noticeable abnormality  PSYCH/NEURO: pleasant and cooperative, no obvious depression or anxiety, speech and thought processing grossly intact   ASSESSMENT AND PLAN:  Discussed the following assessment and plan:  Suspected Covid-19 Virus Infection - Plan: [employer] COVID-19 HOME MONITORING PROGRAM, Temperature monitoring  Cough  Nausea  Generalized body aches   Long discussion with patinent in regards to availability of coronavirus testing.  Advised both husband and wife that there are locations that are opening up this week for coronavirus testing by appointment only, and I would be happy to put the orders in however discussed that whether or not the test is positive or negative still does not change my treatment plan and my recommendations.  Due to symptoms suspect of COVID-19, I recommend patient begins a minimum 7-day self quarantine with a potential to extend to full 14-day if at the 7-day mark symptoms have not resolved.  We will also cover with Clindamycin TID for 10 days, Zofran  PRN nausea and patient will use Tussionex cough syrup as needed for cough and congestion (orders placed in telephone encounter).  Advised to keep up good fluid intake with lots of water, get plenty of rest, use Tylenol as needed for any aches or pains and call office right away and/or go to ER for evaluation if symptoms worsen or any new symptoms develop.  Patient verbalized understanding of these instructions.  They also will consider being tested for COVID-19 and let us know if they decide they would like to move forward and have the COVID-19 testing orders placed.   I discussed the assessment and treatment plan with the patient. The patient was provided an opportunity to ask questions and all were answered. The patient agreed with the plan and demonstrated an understanding of the instructions.   The patient was advised to call back or seek an in-person evaluation if the symptoms worsen or if the condition fails to improve as anticipated.  25 minutes spent on video call with patient discussing plan of care, medications, alarm signs to be aware of.    Jodelle Green, FNP

## 2018-12-15 NOTE — Telephone Encounter (Signed)
See office visit note for 12/15/2018  Work note and Rx for clindamycin, zofran and tussionex in this encounter due to epic issues not allowing [place] to open visit note at time of visit.

## 2018-12-15 NOTE — Telephone Encounter (Signed)
Called patient spouse to inquire as to callto EMS over the weekend for symptoms of COVID for spouse now patient is starting to have symptoms. Headache, body aches, fever , chills , fatigue. Scheduled with NP at 1:40

## 2018-12-27 ENCOUNTER — Other Ambulatory Visit: Payer: Self-pay | Admitting: Internal Medicine

## 2018-12-27 DIAGNOSIS — F419 Anxiety disorder, unspecified: Secondary | ICD-10-CM

## 2018-12-27 DIAGNOSIS — F32A Depression, unspecified: Secondary | ICD-10-CM

## 2018-12-27 DIAGNOSIS — F329 Major depressive disorder, single episode, unspecified: Secondary | ICD-10-CM

## 2019-01-02 ENCOUNTER — Other Ambulatory Visit: Payer: Self-pay | Admitting: Internal Medicine

## 2019-01-02 DIAGNOSIS — Z760 Encounter for issue of repeat prescription: Secondary | ICD-10-CM

## 2019-01-02 NOTE — Telephone Encounter (Signed)
Refilled: 06/30/2018 Last OV: 12/15/2018 Next OV: not scheduled

## 2019-01-09 ENCOUNTER — Other Ambulatory Visit: Payer: Self-pay | Admitting: Internal Medicine

## 2019-01-09 NOTE — Telephone Encounter (Signed)
Refilled: 06/27/2018 Last OV: 12/14/2017 Next OV: not scheduled

## 2019-01-10 NOTE — Telephone Encounter (Signed)
MyChart message sent :  I have refilled your medication  for 30 days only.  You will need to have a Virtual OFFICE VISIT  prior to any more refills, because this is a controlled substance and you have not been seen in over 6 months. Please call the office  [phone] to schedule.   Regards,   Deborra Medina, MD

## 2019-01-19 ENCOUNTER — Other Ambulatory Visit: Payer: Self-pay | Admitting: Internal Medicine

## 2019-02-10 NOTE — Telephone Encounter (Signed)
Pt has read the mychart message

## 2019-02-19 ENCOUNTER — Other Ambulatory Visit: Payer: Self-pay | Admitting: Internal Medicine

## 2019-02-19 DIAGNOSIS — Z760 Encounter for issue of repeat prescription: Secondary | ICD-10-CM

## 2019-02-19 NOTE — Telephone Encounter (Signed)
Refilled: 01/02/2019 Last OV: 05/16/2018 Next OV: not scheduled

## 2019-02-27 ENCOUNTER — Other Ambulatory Visit: Payer: Self-pay | Admitting: Internal Medicine

## 2019-03-12 ENCOUNTER — Other Ambulatory Visit: Payer: Self-pay | Admitting: Internal Medicine

## 2019-03-12 NOTE — Telephone Encounter (Signed)
Modafinil   Refilled: 01/10/2019 Diazepam   Refilled: 11/17/2018  Last OV: 05/16/2018 Next OV: 03/16/2019

## 2019-03-16 ENCOUNTER — Other Ambulatory Visit: Payer: Self-pay

## 2019-03-16 ENCOUNTER — Ambulatory Visit (INDEPENDENT_AMBULATORY_CARE_PROVIDER_SITE_OTHER): Payer: Self-pay | Admitting: Internal Medicine

## 2019-03-16 ENCOUNTER — Encounter: Payer: Self-pay | Admitting: Internal Medicine

## 2019-03-16 DIAGNOSIS — K760 Fatty (change of) liver, not elsewhere classified: Secondary | ICD-10-CM

## 2019-03-16 DIAGNOSIS — G4726 Circadian rhythm sleep disorder, shift work type: Secondary | ICD-10-CM

## 2019-03-16 DIAGNOSIS — F331 Major depressive disorder, recurrent, moderate: Secondary | ICD-10-CM

## 2019-03-16 DIAGNOSIS — F419 Anxiety disorder, unspecified: Secondary | ICD-10-CM

## 2019-03-16 DIAGNOSIS — N3941 Urge incontinence: Secondary | ICD-10-CM

## 2019-03-16 DIAGNOSIS — E1140 Type 2 diabetes mellitus with diabetic neuropathy, unspecified: Secondary | ICD-10-CM

## 2019-03-16 DIAGNOSIS — R6889 Other general symptoms and signs: Secondary | ICD-10-CM

## 2019-03-16 DIAGNOSIS — F32A Depression, unspecified: Secondary | ICD-10-CM

## 2019-03-16 DIAGNOSIS — F329 Major depressive disorder, single episode, unspecified: Secondary | ICD-10-CM

## 2019-03-16 DIAGNOSIS — E785 Hyperlipidemia, unspecified: Secondary | ICD-10-CM

## 2019-03-16 DIAGNOSIS — Z20822 Contact with and (suspected) exposure to covid-19: Secondary | ICD-10-CM

## 2019-03-16 MED ORDER — DULOXETINE HCL 60 MG PO CPEP: ORAL_CAPSULE | ORAL | 1 refills | Status: DC

## 2019-03-16 MED ORDER — GABAPENTIN 100 MG PO CAPS: 100.0000 mg | ORAL_CAPSULE | Freq: Three times a day (TID) | ORAL | 1 refills | Status: DC

## 2019-03-16 MED ORDER — ONDANSETRON 4 MG PO TBDP: 4.0000 mg | ORAL_TABLET | Freq: Three times a day (TID) | ORAL | 0 refills | Status: DC | PRN

## 2019-03-16 NOTE — Assessment & Plan Note (Signed)
Presumed by ultrasound changes and serologies negative for autoimmune causes of hepatitis.  Current liver enzymes are normal and all modifiable risk factors including obesity, diabetes and hyperlipidemia have been addressed  

## 2019-03-16 NOTE — Assessment & Plan Note (Signed)
Managed with cymbalta 90 mg daily .  She reports stability of emotions despite increasing financial hardship
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
## 2019-03-16 NOTE — Assessment & Plan Note (Signed)
Untreated risk of CAD is 25% .  She is taking atorvastatin and overdue for labs

## 2019-03-16 NOTE — Assessment & Plan Note (Signed)
She is overdue for follow up . Blood sugars are at goal on 50 mg Januvia daily,  And her neuropathy is managed  with gabapentin

## 2019-03-16 NOTE — Assessment & Plan Note (Addendum)
She continues to have trouble managing her sleep  wake cycle despite no longer working  without continued  modafinil

## 2019-03-23 ENCOUNTER — Telehealth: Payer: Self-pay

## 2019-03-23 NOTE — Telephone Encounter (Signed)
LMTCB. Need to schedule pt for fasting lab appt.  

## 2019-04-13 ENCOUNTER — Other Ambulatory Visit: Payer: Self-pay | Admitting: Internal Medicine

## 2019-04-13 DIAGNOSIS — Z760 Encounter for issue of repeat prescription: Secondary | ICD-10-CM

## 2019-04-14 ENCOUNTER — Other Ambulatory Visit: Payer: Self-pay | Admitting: Internal Medicine

## 2019-04-14 DIAGNOSIS — Z760 Encounter for issue of repeat prescription: Secondary | ICD-10-CM

## 2019-04-14 NOTE — Telephone Encounter (Signed)
Refilled: 02/20/2019 Last OV: 03/16/2019 Next OV: not scheduled

## 2019-04-15 ENCOUNTER — Other Ambulatory Visit: Payer: Self-pay

## 2019-04-15 ENCOUNTER — Other Ambulatory Visit (INDEPENDENT_AMBULATORY_CARE_PROVIDER_SITE_OTHER): Payer: Self-pay

## 2019-04-15 DIAGNOSIS — E1140 Type 2 diabetes mellitus with diabetic neuropathy, unspecified: Secondary | ICD-10-CM

## 2019-04-15 DIAGNOSIS — N3941 Urge incontinence: Secondary | ICD-10-CM

## 2019-04-15 LAB — URINALYSIS, ROUTINE W REFLEX MICROSCOPIC
Bilirubin Urine: NEGATIVE
Ketones, ur: NEGATIVE
Leukocytes,Ua: NEGATIVE
Nitrite: NEGATIVE
Specific Gravity, Urine: 1.01 (ref 1.000–1.030)
Total Protein, Urine: NEGATIVE
Urine Glucose: NEGATIVE
Urobilinogen, UA: 0.2 (ref 0.0–1.0)
WBC, UA: NONE SEEN (ref 0–?)
pH: 7 (ref 5.0–8.0)

## 2019-04-15 LAB — COMPREHENSIVE METABOLIC PANEL
ALT: 18 U/L (ref 0–35)
AST: 16 U/L (ref 0–37)
Albumin: 4.2 g/dL (ref 3.5–5.2)
Alkaline Phosphatase: 135 U/L — ABNORMAL HIGH (ref 39–117)
BUN: 20 mg/dL (ref 6–23)
CO2: 32 mEq/L (ref 19–32)
Calcium: 9.1 mg/dL (ref 8.4–10.5)
Chloride: 97 mEq/L (ref 96–112)
Creatinine, Ser: 0.84 mg/dL (ref 0.40–1.20)
GFR: 68.96 mL/min (ref >60.00)
Glucose, Bld: 110 mg/dL — ABNORMAL HIGH (ref 70–99)
Potassium: 3.5 mEq/L (ref 3.5–5.1)
Sodium: 137 mEq/L (ref 135–145)
Total Bilirubin: 0.4 mg/dL (ref 0.2–1.2)
Total Protein: 6.9 g/dL (ref 6.0–8.3)

## 2019-04-15 LAB — HEMOGLOBIN A1C: Hgb A1c MFr Bld: 6.3 % (ref 4.6–6.5)

## 2019-04-15 LAB — LIPID PANEL
Cholesterol: 172 mg/dL (ref 0–200)
HDL: 62.8 mg/dL (ref >39.00)
LDL Cholesterol: 90 mg/dL (ref 0–99)
NonHDL: 109.63
Total CHOL/HDL Ratio: 3
Triglycerides: 97 mg/dL (ref 0.0–149.0)
VLDL: 19.4 mg/dL (ref 0.0–40.0)

## 2019-04-15 LAB — MICROALBUMIN / CREATININE URINE RATIO
Creatinine,U: 4.8 mg/dL
Microalb Creat Ratio: 14.6 mg/g (ref 0.0–30.0)
Microalb, Ur: <0.7 mg/dL (ref 0.0–1.9)

## 2019-04-17 LAB — URINE CULTURE
MICRO NUMBER:: 860947
SPECIMEN QUALITY:: ADEQUATE

## 2019-05-01 ENCOUNTER — Other Ambulatory Visit: Payer: Self-pay | Admitting: Internal Medicine

## 2019-05-04 NOTE — Telephone Encounter (Signed)
Refilled: 03/12/2019 Last OV: 03/16/2019 Next OV: not scheduled

## 2019-05-08 ENCOUNTER — Other Ambulatory Visit: Payer: Self-pay

## 2019-05-08 ENCOUNTER — Other Ambulatory Visit: Payer: Self-pay | Admitting: Internal Medicine

## 2019-05-08 ENCOUNTER — Telehealth: Payer: Self-pay
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
## 2019-05-11 ENCOUNTER — Other Ambulatory Visit: Payer: Self-pay

## 2019-05-11 NOTE — Telephone Encounter (Signed)
LMTCB

## 2019-05-13 MED ORDER — DIAZEPAM 10 MG PO TABS: ORAL_TABLET | ORAL | 1 refills | Status: DC

## 2019-05-13 NOTE — Telephone Encounter (Signed)
Spoke with pt and scheduled her for an appt on 05/18/2019.

## 2019-05-13 NOTE — Telephone Encounter (Signed)
Refilled: 03/11/2001 Last OV: 03/16/2019 Next OV: 05/18/2019

## 2019-05-18 ENCOUNTER — Encounter: Payer: Self-pay | Admitting: Internal Medicine

## 2019-05-18 ENCOUNTER — Other Ambulatory Visit: Payer: Self-pay

## 2019-05-18 ENCOUNTER — Telehealth: Payer: Self-pay

## 2019-05-18 ENCOUNTER — Ambulatory Visit (INDEPENDENT_AMBULATORY_CARE_PROVIDER_SITE_OTHER): Payer: Self-pay | Admitting: Internal Medicine

## 2019-05-18 DIAGNOSIS — E1140 Type 2 diabetes mellitus with diabetic neuropathy, unspecified: Secondary | ICD-10-CM

## 2019-05-18 DIAGNOSIS — Z20822 Contact with and (suspected) exposure to covid-19: Secondary | ICD-10-CM

## 2019-05-18 DIAGNOSIS — Z20828 Contact with and (suspected) exposure to other viral communicable diseases: Secondary | ICD-10-CM

## 2019-05-18 DIAGNOSIS — Z6981 Encounter for mental health services for victim of other abuse: Secondary | ICD-10-CM

## 2019-05-18 MED ORDER — SITAGLIPTIN PHOSPHATE 100 MG PO TABS: 100.0000 mg | ORAL_TABLET | Freq: Every day | ORAL | 2 refills | Status: DC

## 2019-05-18 MED ORDER — ONDANSETRON 4 MG PO TBDP: 4.0000 mg | ORAL_TABLET | Freq: Three times a day (TID) | ORAL | 3 refills | Status: DC | PRN

## 2019-05-18 MED ORDER — FUROSEMIDE 20 MG PO TABS: 20.0000 mg | ORAL_TABLET | Freq: Two times a day (BID) | ORAL | 4 refills | Status: DC

## 2019-05-18 NOTE — Telephone Encounter (Signed)
I tried to get in contact with the patient to see if she was referring to her cell phone as her home phone number.
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
## 2019-05-18 NOTE — Telephone Encounter (Signed)
There is no home phone number in the chart. The only number in the chart is her cell phone number.

## 2019-05-19 DIAGNOSIS — Z6981 Encounter for mental health services for victim of other abuse: Secondary | ICD-10-CM | POA: Insufficient documentation
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
## 2019-05-19 NOTE — Assessment & Plan Note (Signed)
She is overdue for follow up . Blood sugars are at goal on 50 mg Januvia daily,  And her neuropathy is managed  with gabapentin. No changes today  Lab Results  Component Value Date   HGBA1C 6.3 04/15/2019   Lab Results  Component Value Date   MICROALBUR <0.7 04/15/2019

## 2019-06-08 ENCOUNTER — Telehealth: Payer: Self-pay | Admitting: *Deleted

## 2019-06-08 NOTE — Telephone Encounter (Signed)
Copied from [place] [id number]. Topic: General - Other >> Jun 08, 2019  1:30 PM Sheran Luz wrote: Patient requesting call back from [place] regarding diabetic medication forms that she has dropped off at office. She states she also has questions about disability appeals.

## 2019-06-11 NOTE — Telephone Encounter (Signed)
Left message to call office

## 2019-06-15 ENCOUNTER — Telehealth: Payer: Self-pay

## 2019-06-15 DIAGNOSIS — E1140 Type 2 diabetes mellitus with diabetic neuropathy, unspecified: Secondary | ICD-10-CM

## 2019-06-15 NOTE — Telephone Encounter (Signed)
Copied from [place] [id number]. Topic: General - Inquiry >> Jun 15, 2019  1:53 PM Alease Frame wrote: Reason for CRM:  Patient returning call for [facility] . Please advise

## 2019-06-15 NOTE — Telephone Encounter (Signed)
Pt called backed in and stated that she would like to see what is Juliann Pulse would be able to give her a call back today ?

## 2019-06-16 NOTE — Telephone Encounter (Signed)
pls give pt a call back today if possible [phone]

## 2019-06-17 NOTE — Telephone Encounter (Signed)
Patient has mailed some paperwork for financial support helping to bet her Januvia 100 mg. Advised patient that I would look for this paperwork , as I did and have not found this letter yet in office front office has not seen this letter.Patient is having hard time affording.Januvia and trying to find work, patient feels she is unable to work due to her anxiety and depression. I am also making referral to Pharm D.

## 2019-06-19 ENCOUNTER — Other Ambulatory Visit: Payer: Self-pay | Admitting: Internal Medicine

## 2019-06-19 DIAGNOSIS — Z760 Encounter for issue of repeat prescription: Secondary | ICD-10-CM

## 2019-06-19 NOTE — Telephone Encounter (Signed)
See other phone note

## 2019-06-20 ENCOUNTER — Other Ambulatory Visit: Payer: Self-pay | Admitting: Internal Medicine

## 2019-06-22 ENCOUNTER — Ambulatory Visit: Payer: Self-pay | Admitting: Pharmacist

## 2019-06-22 ENCOUNTER — Encounter: Payer: Self-pay | Admitting: Pharmacist

## 2019-06-22 DIAGNOSIS — E1140 Type 2 diabetes mellitus with diabetic neuropathy, unspecified: Secondary | ICD-10-CM

## 2019-06-22 MED ORDER — CANAGLIFLOZIN 100 MG PO TABS: 100.0000 mg | ORAL_TABLET | Freq: Every day | ORAL | 3 refills | Status: DC

## 2019-06-22 NOTE — Addendum Note (Signed)
Addended by: De Hollingshead on: 06/22/2019 05:19 PM   Modules accepted: Orders

## 2019-06-22 NOTE — Patient Instructions (Signed)
Visit Information  Goals Addressed            This Visit's Progress     Patient Stated   . "I lost my job in August" (pt-stated)       Current Barriers:  . Diabetes: controlled; most recent A1c 6.3%  o Lost her job last August, applied for disability last October. Has continued to struggle with mental and financial health o Reports previously being well controlled on Invokana; was switched to Januvia as it is a little bit cheaper. However, she is still unable to afford, and has built up debt at her local pharmacy.  . Current antihyperglycemic regimen: Januvia 50 mg daily (splitting 100 mg tab) o Reports allergy to metformin, glipizide . Cardiovascular risk reduction: o Current hypertensive regimen: spironolactone 100 mg daily o Current hyperlipidemia regimen: atorvastatin 40 mg daily   Pharmacist Clinical Goal(s):  Marland Kitchen Over the next 60 days, patient with work with PharmD and primary care provider to address medication access needs  Interventions: . Comprehensive medication review performed, medication list updated in electronic medical record . Discussed Invokana vs Januvia. D/t ASCVD and CKD risk reduction, as well as more expedited application process, we collaboratively decided to switch back to [place] and pursue assistance from [place].  . Patient will complete her portion of application and scan and send application materials to me.  Patient Self Care Activities:  . Patient will check blood glucose occasionally, document, and provide at future appointments . Patient will take medications as prescribed . Patient will report any questions or concerns to provider   Initial goal documentation        The patient verbalized understanding of instructions provided today and declined a print copy of patient instruction materials.   Plan: - Will collaborate w/ patient and provider on medication access needs as above  [name], PharmD, [place] Pharmacist [facility] [place] [phone]

## 2019-06-22 NOTE — Chronic Care Management (AMB) (Addendum)
Chronic Care Management   Note  06/22/2019 Name: Kristie Jones MRN: [id number] DOB: October 06, 1955   Subjective:  Kristie Jones is a 63 y.o. year old female who is a primary care patient of [name], Aris Everts, MD. The CCM team was consulted for assistance with chronic disease management and care coordination needs.    Contacted patient for medication access needs.   Review of patient status, including review of consultants reports, laboratory and other test data, was performed as part of comprehensive evaluation and provision of chronic care management services.   Objective:  Lab Results  Component Value Date   CREATININE 0.84 04/15/2019   CREATININE 0.92 10/25/2017   CREATININE 0.94 11/19/2016    Lab Results  Component Value Date   HGBA1C 6.3 04/15/2019       Component Value Date/Time   CHOL 172 04/15/2019 0856   TRIG 97.0 04/15/2019 0856   HDL 62.80 04/15/2019 0856   CHOLHDL 3 04/15/2019 0856   VLDL 19.4 04/15/2019 0856   LDLCALC 90 04/15/2019 0856   LDLCALC 70 10/25/2017 1633   LDLDIRECT 79.0 11/19/2016 1715    Clinical ASCVD: No    BP Readings from Last 3 Encounters:  05/16/18 136/68  10/25/17 130/68  06/14/17 118/78    Allergies  Allergen Reactions   Ciprofloxacin Rash   Penicillins Rash   Sulfamethoxazole-Trimethoprim Rash    Other Reaction: Other reaction   Cefdinir Other (See Comments)    BLACK STOOLS/YEAST INFECTION   Iodinated Diagnostic Agents Other (See Comments)    MIGRAINE   Methocarbamol    Levaquin [Levofloxacin] Rash    Medications Reviewed Today    Reviewed by De Hollingshead, [facility] (Pharmacist) on 06/22/19 at [place] List Status: <None>  Medication Order Taking? Sig Documenting Provider Last Dose Status Informant  Aspirin-Acetaminophen-Caffeine (EXCEDRIN MIGRAINE PO) [id number] Yes Take by mouth as needed. [provider] Taking Active   atorvastatin (LIPITOR) 40 MG tablet [id number] Yes TAKE 1 TABLET BY MOUTH ONCE  DAILY. Crecencio Mc, MD Taking Active   diazepam (VALIUM) 10 MG tablet [id number] Yes 1 tablet daily at bedtime as needed for anxiety/insomnia Crecencio Mc, MD Taking Active   DULoxetine (CYMBALTA) 30 MG capsule [id number] Yes TAKE 1 TABLET BY MOUTH ONCE DAILY. (TO BE TAKEN WITH 60MG  TABLET) Crecencio Mc, MD Taking Active   DULoxetine (CYMBALTA) 60 MG capsule [id number] Yes KEEP ON FILE FOR FUTURE REFILLS Crecencio Mc, MD Taking Active   furosemide (LASIX) 20 MG tablet [id number] Yes Take 1 tablet (20 mg total) by mouth 2 (two) times daily. Crecencio Mc, MD Taking Active   gabapentin (NEURONTIN) 100 MG capsule [id number] Yes Take 1 capsule (100 mg total) by mouth 3 (three) times daily. Crecencio Mc, MD Taking Active            Med Note Mayo Ao Jun 22, 2019  4:13 PM) PRN  meloxicam (MOBIC) 7.5 MG tablet [id number] Yes TAKE 1 TABLET AS NEEDED Crecencio Mc, MD Taking Active   modafinil (PROVIGIL) 200 MG tablet [id number] Yes TAKE 1 & 1/2 TABLETS BY MOUTH ONCE DAILY Crecencio Mc, MD Taking Active   ondansetron (ZOFRAN ODT) 4 MG disintegrating tablet [id number] Yes Take 1 tablet (4 mg total) by mouth every 8 (eight) hours as needed for nausea or vomiting. Crecencio Mc, MD Taking Active   [facility] LANCETS FINE MISC [id number] Yes CHECK BLOOD SUGAR ONCE DAILY Crecencio Mc, MD Taking  Active   ONETOUCH VERIO test strip [id number] Yes CHECK SUGAR ONCE DAILY Crecencio Mc, MD Taking Active   sitaGLIPtin (JANUVIA) 100 MG tablet [id number] No Take 1 tablet (100 mg total) by mouth daily.  Patient not taking: Reported on 06/22/2019   Crecencio Mc, MD Not Taking Active   spironolactone (ALDACTONE) 100 MG tablet [id number] Yes TAKE 1 TABLET BY MOUTH ONCE DAILY. Crecencio Mc, MD Taking Active   traMADol Veatrice Bourbon) 50 MG tablet [id number] Yes TAKE (2) TABLETS BY MOUTH EVERY SIX HOURS AS NEEDED FOR PAIN. Crecencio Mc, MD Taking Active            Assessment:    Goals Addressed            This Visit's Progress     Patient Stated    "I lost my job in August" (pt-stated)       Current Barriers:   Diabetes: controlled; most recent A1c 6.3%  o Lost her job last August, applied for disability last October. Has continued to struggle with mental and financial health o Reports previously being well controlled on Invokana; was switched to Januvia as it is a little bit cheaper. However, she is still unable to afford, and has built up debt at her local pharmacy.   Current antihyperglycemic regimen: Januvia 50 mg daily (splitting 100 mg tab) o Reports allergy to metformin, glipizide  Cardiovascular risk reduction: o Current hypertensive regimen: spironolactone 100 mg daily o Current hyperlipidemia regimen: atorvastatin 40 mg daily   Pharmacist Clinical Goal(s):   Over the next 60 days, patient with work with PharmD and primary care provider to address medication access needs  Interventions:  Comprehensive medication review performed, medication list updated in electronic medical record  Discussed Invokana vs Januvia. D/t ASCVD and CKD risk reduction, as well as more expedited application process, we collaboratively decided to switch back to [place] and pursue assistance from [place].   Patient will complete her portion of application and scan and send application materials to me.  Patient Self Care Activities:   Patient will check blood glucose occasionally, document, and provide at future appointments  Patient will take medications as prescribed  Patient will report any questions or concerns to provider   Initial goal documentation        Plan: - Will collaborate w/ patient and provider on medication access needs as above  [name], PharmD, [place] Pharmacist [place] [phone]

## 2019-06-23 ENCOUNTER — Encounter: Payer: Self-pay | Admitting: *Deleted

## 2019-06-23 ENCOUNTER — Ambulatory Visit: Payer: Self-pay | Admitting: *Deleted

## 2019-06-23 DIAGNOSIS — F329 Major depressive disorder, single episode, unspecified: Secondary | ICD-10-CM

## 2019-06-23 DIAGNOSIS — F411 Generalized anxiety disorder: Secondary | ICD-10-CM

## 2019-06-23 DIAGNOSIS — F4310 Post-traumatic stress disorder, unspecified: Secondary | ICD-10-CM

## 2019-06-23 DIAGNOSIS — F32A Depression, unspecified: Secondary | ICD-10-CM

## 2019-06-23 NOTE — Patient Instructions (Signed)
Visit Information  [name] - it was nice speaking with you today. I have reached out to my colleagues for recommendations about support and resources as outlined in your care plan below. I will follow up with you via MyChart message as promised.  Goals Addressed            This Visit's Progress   . "I need help getting mental health support" (pt-stated)       Current Barriers:  Marland Kitchen Knowledge Deficits related to resources available for long term mental health support  - patient expresses that she needs resources for long term mental health support and counseling; she reports that she is attending counseling via a resource she learned about through a local shelter but this free service will not be long term; patient is asking for help finding a long term counselor in the [place], [place], or [place] area  Nurse Case Manager Clinical Goal(s):  Marland Kitchen Over the next 30 days, patient will continue working with mental health counselor for support . Over the next 30 days, patient will work with Consulting civil engineer to find community resource for long term mental health support  Interventions:  . Collaborated with LCSW partners regarding community mental health resources  Patient Self Care Activities:  . Self administers medications as prescribed . Attends all scheduled provider appointments . Calls pharmacy for medication refills . Performs ADL's independently . Performs IADL's independently . Calls provider office for new concerns or questions . Unable to independently self manage mental health needs and resources  Plan: I will follow up with the patient regarding ongoing mental health services and needs.   Initial goal documentation     . "I need some help with housing and transportation and follow up on my disability" (pt-stated)       Current Barriers:  Marland Kitchen Knowledge Deficits related to process and resources for support around housing and transportation resources   Nurse Case Manager  Clinical Goal(s):  Marland Kitchen Over the next 30 days, patient will verbalize understanding of plan for housing and transportation resources; patient has already been referred to the care guide team for additional support  Interventions:  . Collaborated with care guide regarding resources for housing and assistance with disability application; patient requests resources for housing in [place], [place], and [employer] [place]  Patient Self Care Activities:  . Performs ADL's independently . Performs IADL's independently . Calls provider office for new concerns or questions . Unable to independently find resources for housing and complete disability application follow up  Initial goal documentation        The patient verbalized understanding of instructions provided today and declined a print copy of patient instruction materials.   The care management team will reach out to the patient again over the next 14 days.   Kristie Jones MHA,BSN,RN,CCM Nurse Care Coordinator [facility] Care Management [phone]

## 2019-06-23 NOTE — Chronic Care Management (AMB) (Signed)
Care Management   Initial Visit Note  06/23/2019 Name: Kristie Jones MRN: [id number] DOB: September 03, 1955  Referred by: Crecencio Mc, MD Reason for referral : Care Coordination   Kristie Jones is a 63 y.o. year old female who is a primary care patient of Derrel Nip, Aris Everts, MD. The care management team was consulted for assistance with chronic disease management and care coordination needs related to Anxiety, Depression and community resource needs related to housing/transportation  Review of patient status, including review of consultants reports, relevant laboratory and other test results, and collaboration with appropriate care team members and the patient's provider was performed as part of comprehensive patient evaluation and provision of chronic care management services.    SDOH (Social Determinants of Health) screening performed today: Housing  Transportation Depression   Social Connections Stress. See Care Plan for related entries.   Medications: Outpatient Encounter Medications as of 06/23/2019  Medication Sig Note  . Aspirin-Acetaminophen-Caffeine (EXCEDRIN MIGRAINE PO) Take by mouth as needed.   Marland Kitchen atorvastatin (LIPITOR) 40 MG tablet TAKE 1 TABLET BY MOUTH ONCE DAILY.   . canagliflozin (INVOKANA) 100 MG TABS tablet Take 1 tablet (100 mg total) by mouth daily before breakfast.   . diazepam (VALIUM) 10 MG tablet 1 tablet daily at bedtime as needed for anxiety/insomnia   . DULoxetine (CYMBALTA) 30 MG capsule TAKE 1 TABLET BY MOUTH ONCE DAILY. (TO BE TAKEN WITH 60MG  TABLET)   . DULoxetine (CYMBALTA) 60 MG capsule KEEP ON FILE FOR FUTURE REFILLS   . furosemide (LASIX) 20 MG tablet Take 1 tablet (20 mg total) by mouth 2 (two) times daily.   Marland Kitchen gabapentin (NEURONTIN) 100 MG capsule Take 1 capsule (100 mg total) by mouth 3 (three) times daily. 06/22/2019: PRN  . meloxicam (MOBIC) 7.5 MG tablet TAKE 1 TABLET AS NEEDED   . modafinil (PROVIGIL) 200 MG tablet TAKE 1 & 1/2 TABLETS BY MOUTH ONCE  DAILY   . ondansetron (ZOFRAN ODT) 4 MG disintegrating tablet Take 1 tablet (4 mg total) by mouth every 8 (eight) hours as needed for nausea or vomiting.   [name] DELICA LANCETS FINE MISC CHECK BLOOD SUGAR ONCE DAILY   . ONETOUCH VERIO test strip CHECK SUGAR ONCE DAILY   . spironolactone (ALDACTONE) 100 MG tablet TAKE 1 TABLET BY MOUTH ONCE DAILY.   . traMADol (ULTRAM) 50 MG tablet TAKE (2) TABLETS BY MOUTH EVERY SIX HOURS AS NEEDED FOR PAIN.    No facility-administered encounter medications on file as of 06/23/2019.      Objective:   Goals Addressed            This Visit's Progress   . "I need help getting mental health support" (pt-stated)       Current Barriers:  Marland Kitchen Knowledge Deficits related to resources available for long term mental health support  - patient expresses that she needs resources for long term mental health support and counseling; she reports that she is attending counseling via a resource she learned about through a local shelter but this free service will not be long term; patient is asking for help finding a long term counselor in the [place], [place], or [place] area  Nurse Case Manager Clinical Goal(s):  Marland Kitchen Over the next 30 days, patient will continue working with mental health counselor for support . Over the next 30 days, patient will work with Consulting civil engineer to find community resource for long term mental health support  Interventions:  . Collaborated with LCSW  partners regarding community mental health resources  Patient Self Care Activities:  . Self administers medications as prescribed . Attends all scheduled provider appointments . Calls pharmacy for medication refills . Performs ADL's independently . Performs IADL's independently . Calls provider office for new concerns or questions . Unable to independently self manage mental health needs  Plan: I will follow up with the patient regarding ongoing mental health services and needs over  the next 30 days.   Initial goal documentation     . "I need some help with housing and transportation and follow up on my disability" (pt-stated)       Current Barriers:  Marland Kitchen Knowledge Deficits related to process and resources for support around housing and transportation resources and disability application follow up  Nurse Case Manager Clinical Goal(s):  Marland Kitchen Over the next 30 days, patient will verbalize understanding of plan for housing and transportation resources and disability application and process; patient has already been referred to the care guide team for additional support  Interventions:  . Collaborated with care guide regarding resources for housing and assistance with disability application; patient requests resources for housing and transportation in [place], [place], and [employer]  Patient Self Care Activities:  . Performs ADL's independently . Performs IADL's independently . Calls provider office for new concerns or questions . Unable to independently find resources for housing and complete disability application follow up  Initial goal documentation        Plan:   The care management team will reach out to the patient again over the next 14 days.   [place] Coordinator [place] / [place] Management  [phone]

## 2019-06-25 ENCOUNTER — Other Ambulatory Visit: Payer: Self-pay

## 2019-06-25 ENCOUNTER — Telehealth: Payer: Self-pay

## 2019-06-25 NOTE — Telephone Encounter (Signed)
Copied from [place] [id number]. Topic: Referral - Status >> Jun 25, 2019  [id number] PM Simone Curia D wrote: [id number] Left message on voicemail for patient to return my call regarding community resources.  Sent patient resources via MyChart per referral message from [place]. Ambrose Mantle [phone]

## 2019-06-26 ENCOUNTER — Ambulatory Visit: Payer: Self-pay | Admitting: *Deleted

## 2019-06-26 DIAGNOSIS — F32A Depression, unspecified: Secondary | ICD-10-CM

## 2019-06-26 DIAGNOSIS — F329 Major depressive disorder, single episode, unspecified: Secondary | ICD-10-CM

## 2019-06-26 DIAGNOSIS — F4310 Post-traumatic stress disorder, unspecified: Secondary | ICD-10-CM

## 2019-06-26 DIAGNOSIS — F419 Anxiety disorder, unspecified: Secondary | ICD-10-CM

## 2019-06-29 NOTE — Patient Instructions (Signed)
Visit Information  Goals Addressed            This Visit's Progress   . "I need help getting mental health support" (pt-stated)       Current Barriers:  Marland Kitchen Knowledge Deficits related to resources available for long term mental health support  - patient has been provided contact information and resources for long term mental health support and counseling in the requested [place] county area ([place], [place], [place] [employer]) by the [name] via [employer] as per the patient's reqest  Nurse Case Manager Clinical Goal(s):  Marland Kitchen Over the next 30 days, patient will continue working with mental health counselor for support . Over the next 30 days, patient will work with Consulting civil engineer to find community resource for long term mental health support (met)  Interventions:  . Collaborated with [facility] Care Guide team regarding community mental health resources  Patient Self Care Activities:  . Self administers medications as prescribed . Attends all scheduled provider appointments . Calls pharmacy for medication refills . Performs ADL's independently . Performs IADL's independently . Calls provider office for new concerns or questions . Unable to independently self manage mental health needs  Plan: I will follow up with the patient regarding ongoing mental health services and needs over the next 30 days.   Initial goal documentation     . "I need some help with housing and transportation and follow up on my disability" (pt-stated)       Current Barriers:  Marland Kitchen Knowledge Deficits related to process and resources for support around housing and transportation resources and disability application follow up  Nurse Case Manager Clinical Goal(s):  Marland Kitchen Over the next 30 days, patient will verbalize understanding of plan for housing and transportation resources and disability application and process; patient has already been referred to the care guide team for additional  support  Interventions:  . Collaborated with care guide regarding resources for housing and assistance with disability application; patient requests resources for housing in [place], [place], and [employer]   Patient Self Care Activities:  . Performs ADL's independently . Performs IADL's independently . Calls provider office for new concerns or questions . Unable to independently find resources for housing and complete disability application follow up  Initial goal documentation       The care management team will reach out to the patient again over the next 30 days.   Janalyn Shy MHA,BSN,RN,CCM Nurse Care Coordinator [facility] Care Management [phone]

## 2019-06-30 NOTE — Chronic Care Management (AMB) (Signed)
  Care Management   Follow Up Note   06/30/2019 Name: Kristie Jones MRN: [id number] DOB: August 14, 1955  Referred by: Crecencio Mc, MD Reason for referral : Care Coordination (f/u community resources)   Kristie Jones is a 63 y.o. year old female who is a primary care patient of Derrel Nip, Aris Everts, MD. The care management team was consulted for assistance with care management and care coordination needs.    Review of patient status, including review of consultants reports, relevant laboratory and other test results, and collaboration with appropriate care team members and the patient's provider was performed as part of comprehensive patient evaluation and provision of chronic care management services.    SDOH (Social Determinants of Health) screening performed today: Transportation Depression  . See Care Plan for related entries.   Advanced Directives: See Care Plan and [employer] application for related entries.   Goals Addressed            This Visit's Progress   . "I need help getting mental health support" (pt-stated)       Current Barriers:  Marland Kitchen Knowledge Deficits related to resources available for long term mental health support  - patient has been provided contact information and resources for long term mental health support and counseling in the requested [place] county area ([place], [place], [place]) by the [name] via [employer] as per the patient's reqest  Nurse Case Manager Clinical Goal(s):  Marland Kitchen Over the next 30 days, patient will continue working with mental health counselor for support . Over the next 30 days, patient will work with Consulting civil engineer to find community resource for long term mental health support (met)  Interventions:  . Collaborated with [facility] Care Guide team regarding community mental health resources  Patient Self Care Activities:  . Self administers medications as prescribed . Attends all scheduled provider appointments .  Calls pharmacy for medication refills . Performs ADL's independently . Performs IADL's independently . Calls provider office for new concerns or questions . Unable to independently self manage mental health needs  Plan: I will follow up with the patient regarding ongoing mental health services and needs over the next 30 days.   Initial goal documentation     . "I need some help with housing and transportation and follow up on my disability" (pt-stated)       Current Barriers:  Marland Kitchen Knowledge Deficits related to process and resources for support around housing and transportation resources and disability application follow up  Nurse Case Manager Clinical Goal(s):  Marland Kitchen Over the next 30 days, patient will verbalize understanding of plan for housing and transportation resources and disability application and process; patient has already been referred to the care guide team for additional support  Interventions:  . Collaborated with care guide regarding resources for housing and assistance with disability application; patient requests resources for housing in [place], [place], and [employer]   Patient Self Care Activities:  . Performs ADL's independently . Performs IADL's independently . Calls provider office for new concerns or questions . Unable to independently find resources for housing and complete disability application follow up  Initial goal documentation         The care management team will reach out to the patient again over the next 30 days.    [place] Coordinator [facility]  / [facility] Care Management  [phone]

## 2019-07-03 ENCOUNTER — Other Ambulatory Visit: Payer: Self-pay | Admitting: Internal Medicine

## 2019-07-06 ENCOUNTER — Ambulatory Visit: Payer: Self-pay | Admitting: Pharmacist

## 2019-07-06 DIAGNOSIS — E1140 Type 2 diabetes mellitus with diabetic neuropathy, unspecified: Secondary | ICD-10-CM

## 2019-07-06 NOTE — Telephone Encounter (Signed)
Refilled: 05/13/2019 Last OV: 05/18/2019 Next OV: not scheduled

## 2019-07-06 NOTE — Chronic Care Management (AMB) (Signed)
Chronic Care Management   Follow Up Note   07/06/2019 Name: Kristie Jones MRN: [id number] DOB: 07/27/1956  Referred by: Crecencio Mc, MD Reason for referral : Chronic Care Management (Medication Management)   Kristie Jones is a 63 y.o. year old female who is a primary care patient of [name], Aris Everts, MD. The CCM team was consulted for assistance with chronic disease management and care coordination needs.    Care coordination completed today.   Review of patient status, including review of consultants reports, relevant laboratory and other test results, and collaboration with appropriate care team members and the patient's provider was performed as part of comprehensive patient evaluation and provision of chronic care management services.    SDOH (Social Determinants of Health) screening performed today: Financial Strain . See Care Plan for related entries.   Outpatient Encounter Medications as of 07/06/2019  Medication Sig Note   Aspirin-Acetaminophen-Caffeine (EXCEDRIN MIGRAINE PO) Take by mouth as needed.    atorvastatin (LIPITOR) 40 MG tablet TAKE 1 TABLET BY MOUTH ONCE DAILY.    canagliflozin (INVOKANA) 100 MG TABS tablet Take 1 tablet (100 mg total) by mouth daily before breakfast.    diazepam (VALIUM) 10 MG tablet TAKE 1 TABLET AT BEDTIME AS NEEDED FOR ANXIETY    DULoxetine (CYMBALTA) 30 MG capsule TAKE 1 TABLET BY MOUTH ONCE DAILY. (TO BE TAKEN WITH 60MG  TABLET)    DULoxetine (CYMBALTA) 60 MG capsule KEEP ON FILE FOR FUTURE REFILLS    furosemide (LASIX) 20 MG tablet Take 1 tablet (20 mg total) by mouth 2 (two) times daily.    gabapentin (NEURONTIN) 100 MG capsule Take 1 capsule (100 mg total) by mouth 3 (three) times daily. 06/22/2019: PRN   meloxicam (MOBIC) 7.5 MG tablet TAKE 1 TABLET AS NEEDED    modafinil (PROVIGIL) 200 MG tablet TAKE 1 & 1/2 TABLETS BY MOUTH ONCE DAILY    ondansetron (ZOFRAN ODT) 4 MG disintegrating tablet Take 1 tablet (4 mg total) by  mouth every 8 (eight) hours as needed for nausea or vomiting.    ONETOUCH DELICA LANCETS FINE MISC CHECK BLOOD SUGAR ONCE DAILY    ONETOUCH VERIO test strip CHECK SUGAR ONCE DAILY    spironolactone (ALDACTONE) 100 MG tablet TAKE 1 TABLET BY MOUTH ONCE DAILY.    traMADol (ULTRAM) 50 MG tablet TAKE (2) TABLETS BY MOUTH EVERY SIX HOURS AS NEEDED FOR PAIN.    No facility-administered encounter medications on file as of 07/06/2019.      Goals Addressed            This Visit's Progress     Patient Stated    "I lost my job in August" (pt-stated)       Current Barriers:   Diabetes: controlled; most recent A1c 6.3%  o Collaboratively decided to switch from Januvia back to Invokana d/t CKD/ASCVD risk reduction and more expedited application process.   Current antihyperglycemic regimen: Januvia 50 mg daily (splitting 100 mg tab) o Reports allergy to metformin, glipizide  Cardiovascular risk reduction: o Current hypertensive regimen: spironolactone 100 mg daily o Current hyperlipidemia regimen: atorvastatin 40 mg daily   Pharmacist Clinical Goal(s):   Over the next 60 days, patient with work with PharmD and primary care provider to address medication access needs  Interventions:  Received patient and provider portions of the application. Submitted to [employer]. Will f/u with the company 2-3 business days.   Patient Self Care Activities:   Patient will check blood glucose  occasionally, document, and provide at future appointments  Patient will take medications as prescribed  Patient will report any questions or concerns to provider   Please see past updates related to this goal by clicking on the "Past Updates" button in the selected goal          Plan: - Will f/u with Wynetta Emery and Wynetta Emery in 2-3 business days  [name], PharmD, [place], [place] Pharmacist [employer] [phone]

## 2019-07-06 NOTE — Patient Instructions (Signed)
Visit Information  Goals Addressed            This Visit's Progress     Patient Stated   . "I lost my job in August" (pt-stated)       Current Barriers:  . Diabetes: controlled; most recent A1c 6.3%  o Collaboratively decided to switch from Januvia back to Invokana d/t CKD/ASCVD risk reduction and more expedited application process.  . Current antihyperglycemic regimen: Januvia 50 mg daily (splitting 100 mg tab) o Reports allergy to metformin, glipizide . Cardiovascular risk reduction: o Current hypertensive regimen: spironolactone 100 mg daily o Current hyperlipidemia regimen: atorvastatin 40 mg daily   Pharmacist Clinical Goal(s):  Marland Kitchen Over the next 60 days, patient with work with PharmD and primary care provider to address medication access needs  Interventions: . Received patient and provider portions of the application. Submitted to [employer]. Will f/u with the company 2-3 business days.   Patient Self Care Activities:  . Patient will check blood glucose occasionally, document, and provide at future appointments . Patient will take medications as prescribed . Patient will report any questions or concerns to provider   Please see past updates related to this goal by clicking on the "Past Updates" button in the selected goal         The patient verbalized understanding of instructions provided today and declined a print copy of patient instruction materials.   Plan: - Will f/u with Wynetta Emery and Wynetta Emery in 2-3 business days  [name], PharmD, [place], [place] Pharmacist [employer] [phone]

## 2019-07-09 ENCOUNTER — Other Ambulatory Visit: Payer: Self-pay | Admitting: Internal Medicine

## 2019-07-09 ENCOUNTER — Ambulatory Visit: Payer: Self-pay | Admitting: Pharmacist

## 2019-07-09 ENCOUNTER — Telehealth: Payer: Self-pay

## 2019-07-09 DIAGNOSIS — E1140 Type 2 diabetes mellitus with diabetic neuropathy, unspecified: Secondary | ICD-10-CM

## 2019-07-09 DIAGNOSIS — R3911 Hesitancy of micturition: Secondary | ICD-10-CM

## 2019-07-09 DIAGNOSIS — F419 Anxiety disorder, unspecified: Secondary | ICD-10-CM

## 2019-07-09 DIAGNOSIS — K760 Fatty (change of) liver, not elsewhere classified: Secondary | ICD-10-CM

## 2019-07-09 DIAGNOSIS — F32A Depression, unspecified: Secondary | ICD-10-CM

## 2019-07-09 NOTE — Chronic Care Management (AMB) (Signed)
Chronic Care Management   Follow Up Note   07/09/2019 Name: Kristie Jones MRN: [id number] DOB: Sep 01, 1955  Referred by: Crecencio Mc, MD Reason for referral : Chronic Care Management (Medication Management)   Kristie Jones is a 63 y.o. year old female who is a primary care patient of [name], Aris Everts, MD. The CCM team was consulted for assistance with chronic disease management and care coordination needs.    Care coordination completed today.   Review of patient status, including review of consultants reports, relevant laboratory and other test results, and collaboration with appropriate care team members and the patient's provider was performed as part of comprehensive patient evaluation and provision of chronic care management services.    SDOH (Social Determinants of Health) screening performed today: Financial Strain  Stress. See Care Plan for related entries.   Outpatient Encounter Medications as of 07/09/2019  Medication Sig Note  . Aspirin-Acetaminophen-Caffeine (EXCEDRIN MIGRAINE PO) Take by mouth as needed.   Marland Kitchen atorvastatin (LIPITOR) 40 MG tablet TAKE 1 TABLET BY MOUTH ONCE DAILY.   . canagliflozin (INVOKANA) 100 MG TABS tablet Take 1 tablet (100 mg total) by mouth daily before breakfast.   . diazepam (VALIUM) 10 MG tablet TAKE 1 TABLET AT BEDTIME AS NEEDED FOR ANXIETY   . DULoxetine (CYMBALTA) 30 MG capsule TAKE 1 TABLET BY MOUTH ONCE DAILY. (TO BE TAKEN WITH 60MG  TABLET)   . DULoxetine (CYMBALTA) 60 MG capsule KEEP ON FILE FOR FUTURE REFILLS   . furosemide (LASIX) 20 MG tablet Take 1 tablet (20 mg total) by mouth 2 (two) times daily.   Marland Kitchen gabapentin (NEURONTIN) 100 MG capsule Take 1 capsule (100 mg total) by mouth 3 (three) times daily. 06/22/2019: PRN  . meloxicam (MOBIC) 7.5 MG tablet TAKE 1 TABLET AS NEEDED   . modafinil (PROVIGIL) 200 MG tablet TAKE 1 & 1/2 TABLETS BY MOUTH ONCE DAILY   . ondansetron (ZOFRAN ODT) 4 MG disintegrating tablet Take 1 tablet (4 mg total)  by mouth every 8 (eight) hours as needed for nausea or vomiting.   [name] DELICA LANCETS FINE MISC CHECK BLOOD SUGAR ONCE DAILY   . ONETOUCH VERIO test strip CHECK SUGAR ONCE DAILY   . spironolactone (ALDACTONE) 100 MG tablet TAKE 1 TABLET BY MOUTH ONCE DAILY.   . traMADol (ULTRAM) 50 MG tablet TAKE (2) TABLETS BY MOUTH EVERY SIX HOURS AS NEEDED FOR PAIN.    No facility-administered encounter medications on file as of 07/09/2019.      Goals Addressed            This Visit's Progress     Patient Stated   . "I lost my job in August" (pt-stated)       Current Barriers:  . Diabetes: controlled; most recent A1c 6.3%; management complicated by major depressive disorder, financial concerns, as patient lost her job last year and has not been able to get back on her feet yet . Today, notes that she feels weak, nauseated, and swollen "like I'm not going to the bathroom enough" o Collaboratively decided to switch from Januvia back to Invokana d/t CKD/ASCVD risk reduction and more expedited application process. Submitted this application on 99991111 . Current antihyperglycemic regimen: Nothing; unable to continue to afford Januvia, and has built a significant debt at the pharmacy o Reports allergy to metformin, that it caused lactic acidosis. I do not see evidence of this in her chart o Reports intolerance to glipizide, as it caused her sugar to drop too  low o Reports hx intolerance w/ Victoza  . Cardiovascular risk reduction: o Current hypertensive regimen: spironolactone 100 mg daily o Current hyperlipidemia regimen: atorvastatin 40 mg daily   Pharmacist Clinical Goal(s):  Marland Kitchen Over the next 60 days, patient with work with PharmD and primary care provider to address medication access needs  Interventions: . Contacted [employer]. They require patient's tax return, even though it does not reflect her current income as she worked last year. She has already provided a letter to explain that she  lost her job and has been unable to start a new one. Provided this information to patient - she located her 32 tax return from 2019 and provided this to me. Will resubmit Invokana assistance application to [employer]. Marland Kitchen Collaborated w/ Dr. Derrel Nip and CMA Adair Laundry regarding patient's current symptoms.   Patient Self Care Activities:  . Patient will check blood glucose occasionally, document, and provide at future appointments . Patient will take medications as prescribed . Patient will report any questions or concerns to provider   Please see past updates related to this goal by clicking on the "Past Updates" button in the selected goal          Plan: - Will outreach [employer] in the next 1-2 business days  [name], PharmD, [place], [place] Pharmacist [employer] [phone]

## 2019-07-09 NOTE — Telephone Encounter (Signed)
Spoke with pt in regards to some symptoms that she has been having, nausea, weakness and doesn't think she is peeing the amount that she needs to be. She is very upset and stated that she doesn know how she would get there to get the labs done because her car is broke down and doesn't have insurance to pay for it. She also stated that she had to cancel her therapist appt today. This has also been relayed to Dr. Derrel Nip through secure chat.

## 2019-07-09 NOTE — Progress Notes (Unsigned)
.  comp

## 2019-07-09 NOTE — Telephone Encounter (Signed)
Symptoms documented,  patient has fatty liver Workup for UTI and renal failure in progress

## 2019-07-09 NOTE — Patient Instructions (Signed)
Visit Information  Goals Addressed            This Visit's Progress     Patient Stated   . "I lost my job in August" (pt-stated)       Current Barriers:  . Diabetes: controlled; most recent A1c 6.3%; management complicated by major depressive disorder, financial concerns, as patient lost her job last year and has not been able to get back on her feet yet . Today, notes that she feels weak, nauseated, and swollen "like I'm not going to the bathroom enough". Reports BG yesterday of 184, fasting this morning of 204 o Collaboratively decided to switch from Januvia back to Invokana d/t CKD/ASCVD risk reduction and more expedited application process. Submitted this application on 99991111 . Current antihyperglycemic regimen: Nothing; unable to continue to afford Januvia, and has built a significant debt at the pharmacy o Reports allergy to metformin, that it caused lactic acidosis. I do not see evidence of this in her chart o Reports intolerance to glipizide, as it caused her sugar to drop too low o Reports hx intolerance w/ Victoza  . Cardiovascular risk reduction: o Current hypertensive regimen: spironolactone 100 mg daily o Current hyperlipidemia regimen: atorvastatin 40 mg daily   Pharmacist Clinical Goal(s):  Marland Kitchen Over the next 60 days, patient with work with PharmD and primary care provider to address medication access needs  Interventions: . Contacted [employer]. They require patient's tax return, even though it does not reflect her current income as she worked last year. She has already provided a letter to explain that she lost her job and has been unable to start a new one. Provided this information to patient - she located her 50 tax return from 2019 and provided this to me. Will resubmit Invokana assistance application to [employer]. Marland Kitchen Collaborated w/ Dr. Derrel Nip and CMA Adair Laundry regarding patient's current symptoms.   Patient Self Care Activities:  . Patient will check blood  glucose occasionally, document, and provide at future appointments . Patient will take medications as prescribed . Patient will report any questions or concerns to provider   Please see past updates related to this goal by clicking on the "Past Updates" button in the selected goal         The patient verbalized understanding of instructions provided today and declined a print copy of patient instruction materials.    Plan: - Will outreach [employer] in the next 1-2 business days  [name], PharmD, [place], [place] Pharmacist [facility] [phone]

## 2019-07-09 NOTE — Progress Notes (Signed)
  Lab Results  Component Value Date   HGBA1C 6.3 04/15/2019

## 2019-07-13 ENCOUNTER — Ambulatory Visit: Payer: Self-pay | Admitting: Pharmacist

## 2019-07-13 DIAGNOSIS — E1140 Type 2 diabetes mellitus with diabetic neuropathy, unspecified: Secondary | ICD-10-CM

## 2019-07-13 NOTE — Patient Instructions (Signed)
Visit Information  Goals Addressed            This Visit's Progress     Patient Stated   . "I lost my job in August" (pt-stated)       Current Barriers:  . Diabetes: controlled; most recent A1c 6.3%; management complicated by major depressive disorder, financial concerns, as patient lost her job last year and has not been able to get back on her feet yet o Collaboratively decided to switch from [place] back to [place] d/t CKD/ASCVD risk reduction and more expedited application process. Submitted this application on 99991111. They required her 2019 tax return, even though it does not reflect her current income information.  . Current antihyperglycemic regimen: Nothing; unable to continue to afford Januvia, and has built a significant debt at the pharmacy o Reports allergy to metformin, that it caused lactic acidosis. I do not see evidence of this in her chart o Reports intolerance to glipizide, as it caused her sugar to drop too low o Reports hx intolerance w/ Victoza  . Cardiovascular risk reduction: o Current hypertensive regimen: spironolactone 100 mg daily o Current hyperlipidemia regimen: atorvastatin 40 mg daily   Pharmacist Clinical Goal(s):  Marland Kitchen Over the next 60 days, patient with work with PharmD and primary care provider to address medication access needs  Interventions: . Contacted [employer]. They received her updated application including [id number] tax return. Requested expedited processing, as patient is completely out of her medication. Rep recommended I call back later this week to follow up.  Patient Self Care Activities:  . Patient will check blood glucose occasionally, document, and provide at future appointments . Patient will take medications as prescribed . Patient will report any questions or concerns to provider   Please see past updates related to this goal by clicking on the "Past Updates" button in the selected goal         The patient verbalized  understanding of instructions provided today and declined a print copy of patient instruction materials.   Plan: - Will follow up with [employer] in 2-3 business days  [name], PharmD, [place], [place] Pharmacist [place] [phone]

## 2019-07-13 NOTE — Chronic Care Management (AMB) (Signed)
Chronic Care Management   Follow Up Note   07/13/2019 Name: Kristie Jones MRN: [id number] DOB: 11-22-1955  Referred by: Crecencio Mc, MD Reason for referral : Chronic Care Management (Medication Management)   Kristie Jones is a 63 y.o. year old female who is a primary care patient of [name], Aris Everts, MD. The CCM team was consulted for assistance with chronic disease management and care coordination needs.    Care coordination completed today.   Review of patient status, including review of consultants reports, relevant laboratory and other test results, and collaboration with appropriate care team members and the patient's provider was performed as part of comprehensive patient evaluation and provision of chronic care management services.    SDOH (Social Determinants of Health) screening performed today: Financial Strain . See Care Plan for related entries.   Outpatient Encounter Medications as of 07/13/2019  Medication Sig Note  . Aspirin-Acetaminophen-Caffeine (EXCEDRIN MIGRAINE PO) Take by mouth as needed.   Marland Kitchen atorvastatin (LIPITOR) 40 MG tablet TAKE 1 TABLET BY MOUTH ONCE DAILY.   . canagliflozin (INVOKANA) 100 MG TABS tablet Take 1 tablet (100 mg total) by mouth daily before breakfast.   . diazepam (VALIUM) 10 MG tablet TAKE 1 TABLET AT BEDTIME AS NEEDED FOR ANXIETY   . DULoxetine (CYMBALTA) 30 MG capsule TAKE 1 TABLET BY MOUTH ONCE DAILY. (TO BE TAKEN WITH 60MG  TABLET)   . DULoxetine (CYMBALTA) 60 MG capsule KEEP ON FILE FOR FUTURE REFILLS   . furosemide (LASIX) 20 MG tablet Take 1 tablet (20 mg total) by mouth 2 (two) times daily.   Marland Kitchen gabapentin (NEURONTIN) 100 MG capsule Take 1 capsule (100 mg total) by mouth 3 (three) times daily. 06/22/2019: PRN  . meloxicam (MOBIC) 7.5 MG tablet TAKE 1 TABLET AS NEEDED   . modafinil (PROVIGIL) 200 MG tablet TAKE 1 & 1/2 TABLETS BY MOUTH ONCE DAILY   . ondansetron (ZOFRAN ODT) 4 MG disintegrating tablet Take 1 tablet (4 mg total) by mouth  every 8 (eight) hours as needed for nausea or vomiting.   [name] DELICA LANCETS FINE MISC CHECK BLOOD SUGAR ONCE DAILY   . ONETOUCH VERIO test strip CHECK SUGAR ONCE DAILY   . spironolactone (ALDACTONE) 100 MG tablet TAKE 1 TABLET BY MOUTH ONCE DAILY.   . traMADol (ULTRAM) 50 MG tablet TAKE (2) TABLETS BY MOUTH EVERY SIX HOURS AS NEEDED FOR PAIN.    No facility-administered encounter medications on file as of 07/13/2019.      Goals Addressed            This Visit's Progress     Patient Stated   . "I lost my job in August" (pt-stated)       Current Barriers:  . Diabetes: controlled; most recent A1c 6.3%; management complicated by major depressive disorder, financial concerns, as patient lost her job last year and has not been able to get back on her feet yet o Collaboratively decided to switch from [place] back to [place] d/t CKD/ASCVD risk reduction and more expedited application process. Submitted this application on 99991111. They required her 2019 tax return, even though it does not reflect her current income information.  . Current antihyperglycemic regimen: Nothing; unable to continue to afford Januvia, and has built a significant debt at the pharmacy o Reports allergy to metformin, that it caused lactic acidosis. I do not see evidence of this in her chart o Reports intolerance to glipizide, as it caused her sugar to drop too low o  Reports hx intolerance w/ Victoza  . Cardiovascular risk reduction: o Current hypertensive regimen: spironolactone 100 mg daily o Current hyperlipidemia regimen: atorvastatin 40 mg daily   Pharmacist Clinical Goal(s):  Marland Kitchen Over the next 60 days, patient with work with PharmD and primary care provider to address medication access needs  Interventions: . Contacted [employer]. They received her updated application including [id number] tax return. Requested expedited processing, as patient is completely out of her medication. Rep recommended I call back later  this week to follow up.  Patient Self Care Activities:  . Patient will check blood glucose occasionally, document, and provide at future appointments . Patient will take medications as prescribed . Patient will report any questions or concerns to provider   Please see past updates related to this goal by clicking on the "Past Updates" button in the selected goal         Plan: - Will follow up with [employer] in 2-3 business days  [name], PharmD, [place], [place] Pharmacist [place] [phone]

## 2019-07-16 ENCOUNTER — Ambulatory Visit: Payer: Self-pay | Admitting: Pharmacist

## 2019-07-16 DIAGNOSIS — E1140 Type 2 diabetes mellitus with diabetic neuropathy, unspecified: Secondary | ICD-10-CM

## 2019-07-16 MED ORDER — CANAGLIFLOZIN 100 MG PO TABS: 100.0000 mg | ORAL_TABLET | Freq: Every day | ORAL | 3 refills | Status: DC

## 2019-07-16 NOTE — Patient Instructions (Addendum)
Visit Information  Goals Addressed            This Visit's Progress     Patient Stated   . "I lost my job in August" (pt-stated)       Current Barriers:  . Diabetes: controlled; most recent A1c 6.3%; management complicated by major depressive disorder, financial concerns, as patient lost her job last year and has not been able to get back on her feet yet o Collaboratively decided to switch from [place] back to [place] d/t CKD/ASCVD risk reduction and more expedited application process. Submitted this application on 99991111. They required her 2019 tax return, even though it does not reflect her current income information. Submitted this 07/10/2019 . Current antihyperglycemic regimen: Nothing; unable to continue to afford Januvia, and has built a significant debt at the pharmacy o Reports allergy to metformin, that it caused lactic acidosis. I do not see evidence of this in her chart o Reports intolerance to glipizide, as it caused her sugar to drop too low o Reports hx intolerance w/ Victoza  . Cardiovascular risk reduction: o Current hypertensive regimen: spironolactone 100 mg daily o Current hyperlipidemia regimen: atorvastatin 40 mg daily   Pharmacist Clinical Goal(s):  Marland Kitchen Over the next 60 days, patient with work with PharmD and primary care provider to address medication access needs  Interventions: . Contacted [employer]. Had to provide clarification on income information. Received fax that she has been APPROVED for [place] assistance through 07/15/2020. RXBIN [id number], RxGRP [id number], ID [id number]. Called this information into patient's pharmacy. Resent Invokana 100 mg prescription. Contacted patient and informed her of the above.   Patient Self Care Activities:  . Patient will check blood glucose occasionally, document, and provide at future appointments . Patient will take medications as prescribed . Patient will report any questions or concerns to provider   Please see past  updates related to this goal by clicking on the "Past Updates" button in the selected goal         The patient verbalized understanding of instructions provided today and declined a print copy of patient instruction materials.   Plan: - Will outreach patient in ~4 weeks for continued support   [name], PharmD, [place], [place] Pharmacist [place] [phone]

## 2019-07-16 NOTE — Addendum Note (Signed)
Addended by: De Hollingshead on: 07/16/2019 03:05 PM   Modules accepted: Orders

## 2019-07-16 NOTE — Chronic Care Management (AMB) (Addendum)
Chronic Care Management   Follow Up Note   07/16/2019 Name: Kristie Jones MRN: [id number] DOB: 1956-07-30  Referred by: Crecencio Mc, MD Reason for referral : Chronic Care Management (Medication Management)   Kristie Jones is a 63 y.o. year old female who is a primary care patient of [name], Aris Everts, MD. The CCM team was consulted for assistance with chronic disease management and care coordination needs.    Care coordination completed today.   Review of patient status, including review of consultants reports, relevant laboratory and other test results, and collaboration with appropriate care team members and the patient's provider was performed as part of comprehensive patient evaluation and provision of chronic care management services.    SDOH (Social Determinants of Health) screening performed today: Financial Strain . See Care Plan for related entries.   Outpatient Encounter Medications as of 07/16/2019  Medication Sig Note  . Aspirin-Acetaminophen-Caffeine (EXCEDRIN MIGRAINE PO) Take by mouth as needed.   Marland Kitchen atorvastatin (LIPITOR) 40 MG tablet TAKE 1 TABLET BY MOUTH ONCE DAILY.   . canagliflozin (INVOKANA) 100 MG TABS tablet Take 1 tablet (100 mg total) by mouth daily before breakfast.   . diazepam (VALIUM) 10 MG tablet TAKE 1 TABLET AT BEDTIME AS NEEDED FOR ANXIETY   . DULoxetine (CYMBALTA) 30 MG capsule TAKE 1 TABLET BY MOUTH ONCE DAILY. (TO BE TAKEN WITH 60MG  TABLET)   . DULoxetine (CYMBALTA) 60 MG capsule KEEP ON FILE FOR FUTURE REFILLS   . furosemide (LASIX) 20 MG tablet Take 1 tablet (20 mg total) by mouth 2 (two) times daily.   Marland Kitchen gabapentin (NEURONTIN) 100 MG capsule Take 1 capsule (100 mg total) by mouth 3 (three) times daily. 06/22/2019: PRN  . meloxicam (MOBIC) 7.5 MG tablet TAKE 1 TABLET AS NEEDED   . modafinil (PROVIGIL) 200 MG tablet TAKE 1 & 1/2 TABLETS BY MOUTH ONCE DAILY   . ondansetron (ZOFRAN ODT) 4 MG disintegrating tablet Take 1 tablet (4 mg total) by  mouth every 8 (eight) hours as needed for nausea or vomiting.   Kristie Jones DELICA LANCETS FINE MISC CHECK BLOOD SUGAR ONCE DAILY   . ONETOUCH VERIO test strip CHECK SUGAR ONCE DAILY   . spironolactone (ALDACTONE) 100 MG tablet TAKE 1 TABLET BY MOUTH ONCE DAILY.   . traMADol (ULTRAM) 50 MG tablet TAKE (2) TABLETS BY MOUTH EVERY SIX HOURS AS NEEDED FOR PAIN.    No facility-administered encounter medications on file as of 07/16/2019.     Goals Addressed            This Visit's Progress     Patient Stated   . "I lost my job in August" (pt-stated)       Current Barriers:  . Diabetes: controlled; most recent A1c 6.3%; management complicated by major depressive disorder, financial concerns, as patient lost her job last year and has not been able to get back on her feet yet o Collaboratively decided to switch from [place] back to [facility] d/t CKD/ASCVD risk reduction and more expedited application process. Submitted this application on [id number]. They required her 2019 tax return, even though it does not reflect her current income information. Submitted this 07/10/2019 . Current antihyperglycemic regimen: Nothing; unable to continue to afford Januvia, and has built a significant debt at the pharmacy o Reports allergy to metformin, that it caused lactic acidosis. I do not see evidence of this in her chart o Reports intolerance to glipizide, as it caused her sugar to drop too low  o Reports hx intolerance w/ Victoza  . Cardiovascular risk reduction: o Current hypertensive regimen: spironolactone 100 mg daily o Current hyperlipidemia regimen: atorvastatin 40 mg daily   Pharmacist Clinical Goal(s):  Marland Kitchen Over the next 60 days, patient with work with PharmD and primary care provider to address medication access needs  Interventions: . Contacted [employer]. Had to provide clarification on income information. Received fax that she has been APPROVED for [place] assistance through 07/15/2020. RXBIN [id number],  RxGRP [id number], ID [id number]. Called this information into patient's pharmacy. Resent Invokana 100 mg prescription. Contacted patient and informed her of the above.   Patient Self Care Activities:  . Patient will check blood glucose occasionally, document, and provide at future appointments . Patient will take medications as prescribed . Patient will report any questions or concerns to provider   Please see past updates related to this goal by clicking on the "Past Updates" button in the selected goal         Plan: - Will outreach patient in ~4 weeks for continued support   [name], PharmD, [place], [place] Pharmacist [facility] [phone]

## 2019-07-27 ENCOUNTER — Telehealth: Payer: Self-pay

## 2019-07-27 NOTE — Telephone Encounter (Signed)
Copied from [place] [id number]. Topic: Referral - Status >> Jul 27, 2019  [id number] PM Simone Curia D wrote: [id number] Follow-up call checking to make sure patient received health, transportation and housing resources sent via [place]. Ambrose Mantle [phone]

## 2019-08-13 ENCOUNTER — Ambulatory Visit: Payer: Self-pay | Admitting: Pharmacist

## 2019-08-13 ENCOUNTER — Telehealth: Payer: Self-pay

## 2019-08-13 NOTE — Chronic Care Management (AMB) (Signed)
  Chronic Care Management   Note  08/13/2019 Name: Kristie Jones MRN: [id number] DOB: 1955-11-19  Kristie Jones is a 64 y.o. year old female who is a primary care patient of [name], Aris Everts, MD. The CCM team was consulted for assistance with chronic disease management and care coordination needs.    Attempted to contact patient to follow up on medication management needs. Left HIPAA compliant message for patient to return my call at her convenience.   Follow up plan: - Will collaborate w/ Care Guide to outreach to schedule follow up with me  [name], PharmD, [place], [place] Pharmacist [place] [phone]

## 2019-08-19 ENCOUNTER — Telehealth: Payer: Self-pay | Admitting: Internal Medicine

## 2019-08-19 NOTE — Chronic Care Management (AMB) (Signed)
  Care Management   Note  08/19/2019 Name: Kristie Jones MRN: [id number] DOB: 06/25/1956  Kristie Jones is a 64 y.o. year old female who is a primary care patient of Derrel Nip, Aris Everts, MD and is actively engaged with the care management team. I reached out to Kristie Jones by phone today to assist with re-scheduling a follow up appointment with the Pharmacist  Follow up plan: Telephone appointment with care management team member scheduled for: 08/31/2019  [place], [place] Management  [place], [place] [address] Direct Dial: [email or web address]  Website: [email or web address]

## 2019-08-31 ENCOUNTER — Telehealth: Payer: Self-pay | Admitting: Pharmacist

## 2019-08-31 ENCOUNTER — Ambulatory Visit: Payer: Self-pay | Admitting: Pharmacist

## 2019-08-31 DIAGNOSIS — F331 Major depressive disorder, recurrent, moderate: Secondary | ICD-10-CM

## 2019-08-31 DIAGNOSIS — E1140 Type 2 diabetes mellitus with diabetic neuropathy, unspecified: Secondary | ICD-10-CM

## 2019-08-31 NOTE — Telephone Encounter (Signed)
Today reports "not feeling good" over the past few days. Notes nausea, diarrhea, headaches, coughing, congestion, chills, chills, but is not running a fever. Biggest issue is loss of appetite, and feels like she has lost a few pounds. Notes congestion relief w/ cetirizine. Notes she is unsure if this is all physical, or related to mental health as well. Continues to meet with therapist weekly, though notes she wasn't feeling bad when she alst spoke to her last Thursday.   Recommended COVID testing. Patient verbalized understanding.   Patient is interested in an appointment w/ Dr. Derrel Nip to discuss acute issues as well as chronic health needs. Will collaborate w/ office staff to outreach patient to schedule

## 2019-08-31 NOTE — Patient Instructions (Signed)
Visit Information  Goals Addressed            This Visit's Progress     Patient Stated   . "I lost my job in August" (pt-stated)       Current Barriers:  . Diabetes: controlled; most recent A1c 6.3%; management complicated by major depressive disorder, financial concerns, as patient lost her job last year and has not been able to get back on her feet yet o Today reports "not feeling good" over the past week. Notes nausea, diarrhea, headaches, coughing, congestion, chills, chills, but is not running a fever. Biggest issue is loss of appetite, and feels like she has lost a few pounds.  o Started taking cetirizine d/t nose running more often. Notes that congestion has improved with cetirizine o Notes she still sees a therapist Judson Roch) via [place] every Thursday . Current antihyperglycemic regimen: Invokana 100 mg daily  o Reports allergy to metformin, that it caused lactic acidosis. I do not see evidence of this in her chart o Reports intolerance to glipizide, as it caused her sugar to drop too low o Reports hx intolerance w/ Victoza  . Current glucose readings:  o Reports this morning was 160, though readings have been more "normal" before she started feeling bad, though did not verbalize specific numbers.  . Cardiovascular risk reduction: o Current hypertensive regimen: spironolactone 100 mg daily o Current hyperlipidemia regimen: atorvastatin 40 mg daily   Pharmacist Clinical Goal(s):  Marland Kitchen Over the next 60 days, patient with work with PharmD and primary care provider to address medication management needs.   Interventions: . Recommended patient pursue COVID testing given symptoms of fatigue, chills. She verbalized understanding. . Patient notes that she would like to follow up with Dr. Derrel Nip regarding her symptoms. Will collaborate w/ office staff to follow up.   Patient Self Care Activities:  . Patient will check blood glucose occasionally, document, and provide at future  appointments . Patient will take medications as prescribed . Patient will report any questions or concerns to provider   Please see past updates related to this goal by clicking on the "Past Updates" button in the selected goal         The patient verbalized understanding of instructions provided today and declined a print copy of patient instruction materials.   Plan:  - Will collaborate w/ office staff and PCP as above - Scheduled f/u call 10/12/19  [name], PharmD, [place], [place] Pharmacist [place] [phone]

## 2019-08-31 NOTE — Chronic Care Management (AMB) (Signed)
Chronic Care Management   Follow Up Note   08/31/2019 Name: LENZIE COLUMBUS MRN: [id number] DOB: 23-Nov-1955  Referred by: Crecencio Mc, MD Reason for referral : Chronic Care Management (Medication Management)   HAMPTON SCHEFFLER is a 64 y.o. year old female who is a primary care patient of [name], Aris Everts, MD. The CCM team was consulted for assistance with chronic disease management and care coordination needs.    Contacted patient for medication management f/u.   Review of patient status, including review of consultants reports, relevant laboratory and other test results, and collaboration with appropriate care team members and the patient's provider was performed as part of comprehensive patient evaluation and provision of chronic care management services.    SDOH (Social Determinants of Health) screening performed today: Depression  . See Care Plan for related entries.   Outpatient Encounter Medications as of 08/31/2019  Medication Sig Note  . Aspirin-Acetaminophen-Caffeine (EXCEDRIN MIGRAINE PO) Take by mouth as needed.   Marland Kitchen atorvastatin (LIPITOR) 40 MG tablet TAKE 1 TABLET BY MOUTH ONCE DAILY.   . canagliflozin (INVOKANA) 100 MG TABS tablet Take 1 tablet (100 mg total) by mouth daily before breakfast.   . diazepam (VALIUM) 10 MG tablet TAKE 1 TABLET AT BEDTIME AS NEEDED FOR ANXIETY   . DULoxetine (CYMBALTA) 30 MG capsule TAKE 1 TABLET BY MOUTH ONCE DAILY. (TO BE TAKEN WITH 60MG  TABLET)   . DULoxetine (CYMBALTA) 60 MG capsule KEEP ON FILE FOR FUTURE REFILLS   . furosemide (LASIX) 20 MG tablet Take 1 tablet (20 mg total) by mouth 2 (two) times daily.   Marland Kitchen gabapentin (NEURONTIN) 100 MG capsule Take 1 capsule (100 mg total) by mouth 3 (three) times daily. 06/22/2019: PRN  . meloxicam (MOBIC) 7.5 MG tablet TAKE 1 TABLET AS NEEDED   . modafinil (PROVIGIL) 200 MG tablet TAKE 1 & 1/2 TABLETS BY MOUTH ONCE DAILY   . ondansetron (ZOFRAN ODT) 4 MG disintegrating tablet Take 1 tablet (4 mg total)  by mouth every 8 (eight) hours as needed for nausea or vomiting.   [name] DELICA LANCETS FINE MISC CHECK BLOOD SUGAR ONCE DAILY   . ONETOUCH VERIO test strip CHECK SUGAR ONCE DAILY   . spironolactone (ALDACTONE) 100 MG tablet TAKE 1 TABLET BY MOUTH ONCE DAILY.   . traMADol (ULTRAM) 50 MG tablet TAKE (2) TABLETS BY MOUTH EVERY SIX HOURS AS NEEDED FOR PAIN.    No facility-administered encounter medications on file as of 08/31/2019.     Objective:   Goals Addressed            This Visit's Progress     Patient Stated   . "I lost my job in August" (pt-stated)       Current Barriers:  . Diabetes: controlled; most recent A1c 6.3%; management complicated by major depressive disorder, financial concerns, as patient lost her job last year and has not been able to get back on her feet yet o Today reports "not feeling good" over the past week. Notes nausea, diarrhea, headaches, coughing, congestion, chills, chills, but is not running a fever. Biggest issue is loss of appetite, and feels like she has lost a few pounds.  o Started taking cetirizine d/t nose running more often. Notes that congestion has improved with cetirizine o Notes she still sees a therapist Judson Roch) via [place] every Thursday . Current antihyperglycemic regimen: Invokana 100 mg daily  o Reports allergy to metformin, that it caused lactic acidosis. I do not see evidence  of this in her chart o Reports intolerance to glipizide, as it caused her sugar to drop too low o Reports hx intolerance w/ Victoza  . Current glucose readings:  o Reports this morning was 160, though readings have been more "normal" before she started feeling bad, though did not verbalize specific numbers.  . Cardiovascular risk reduction: o Current hypertensive regimen: spironolactone 100 mg daily o Current hyperlipidemia regimen: atorvastatin 40 mg daily   Pharmacist Clinical Goal(s):  Marland Kitchen Over the next 60 days, patient with work with PharmD and primary care  provider to address medication management needs.   Interventions: . Recommended patient pursue COVID testing given symptoms of fatigue, chills. She verbalized understanding. . Patient notes that she would like to follow up with Dr. Derrel Nip regarding her symptoms. Will collaborate w/ office staff to follow up.   Patient Self Care Activities:  . Patient will check blood glucose occasionally, document, and provide at future appointments . Patient will take medications as prescribed . Patient will report any questions or concerns to provider   Please see past updates related to this goal by clicking on the "Past Updates" button in the selected goal          Plan:  - Will collaborate w/ office staff and PCP as above - Scheduled f/u call 10/12/19  [name], PharmD, BCACP, [place] Pharmacist [place] [phone]

## 2019-09-03 NOTE — Telephone Encounter (Signed)
LMTCB

## 2019-09-05 ENCOUNTER — Other Ambulatory Visit: Payer: Self-pay | Admitting: Internal Medicine

## 2019-09-05 DIAGNOSIS — Z760 Encounter for issue of repeat prescription: Secondary | ICD-10-CM

## 2019-09-09 NOTE — Telephone Encounter (Signed)
Have attempted to call pt several times and have not received a call back.

## 2019-09-15 ENCOUNTER — Other Ambulatory Visit: Payer: Self-pay | Admitting: Internal Medicine

## 2019-09-22 ENCOUNTER — Other Ambulatory Visit: Payer: Self-pay | Admitting: Internal Medicine

## 2019-09-22 DIAGNOSIS — F32A Depression, unspecified: Secondary | ICD-10-CM

## 2019-09-22 DIAGNOSIS — F419 Anxiety disorder, unspecified: Secondary | ICD-10-CM

## 2019-09-22 DIAGNOSIS — F329 Major depressive disorder, single episode, unspecified: Secondary | ICD-10-CM

## 2019-10-03 ENCOUNTER — Other Ambulatory Visit: Payer: Self-pay | Admitting: Internal Medicine

## 2019-10-05 ENCOUNTER — Other Ambulatory Visit: Payer: Self-pay | Admitting: Internal Medicine

## 2019-10-12 ENCOUNTER — Ambulatory Visit: Payer: Self-pay | Admitting: Pharmacist

## 2019-10-12 ENCOUNTER — Telehealth: Payer: Self-pay

## 2019-10-12 NOTE — Chronic Care Management (AMB) (Signed)
  Chronic Care Management   Note  10/12/2019 Name: Kristie Jones MRN: [id number] DOB: 1955-11-14  [name] is a 64 y.o. year old female who is a primary care patient of [name], Aris Everts, MD. The CCM team was consulted for assistance with chronic disease management and care coordination needs.    Attempted to contact patient for medication management review. Left HIPAA compliant message for him to return my call at her convenience.   Follow up plan: - Will collaborate w/ Care Guide to outreach for scheduling  [name], PharmD, [place], [place] Pharmacist [place] [phone]

## 2019-10-13 ENCOUNTER — Telehealth: Payer: Self-pay | Admitting: Internal Medicine

## 2019-10-13 NOTE — Chronic Care Management (AMB) (Signed)
  Care Management   Note  10/13/2019 Name: Kristie Jones MRN: [id number] DOB: 08-27-1955  Kristie Jones is a 64 y.o. year old female who is a primary care patient of Derrel Nip, Aris Everts, MD and is actively engaged with the care management team. I reached out to Kristie Jones by phone today to assist with re-scheduling a follow up visit with the Pharmacist  Follow up plan: Unsuccessful telephone outreach attempt made. A HIPPA compliant phone message was left for the patient providing contact information and requesting a return call.  The care management team will reach out to the patient again over the next 7 days.  If patient returns call to provider office, please advise to call [place]  at [place], [place], [place], [place] [address] Direct Dial: [phone] [email or web address]: [email or web address]

## 2019-10-15 NOTE — Chronic Care Management (AMB) (Signed)
  Care Management   Note  10/15/2019 Name: [name] MRN: [id number] DOB: [birth year]  Kristie Jones is a 64 y.o. year old female who is a primary care patient of Derrel Nip, Aris Everts, MD and is actively engaged with the care management team. I reached out to Kristie Jones by phone today to assist with re-scheduling a follow up visit with the Pharmacist  Follow up plan: Unsuccessful telephone outreach attempt made. A HIPPA compliant phone message was left for the patient providing contact information and requesting a return call.  The care management team will reach out to the patient again over the next 7 days.  If patient returns call to provider office, please advise to call [place] at [place], [place], [place], [place] [address] Direct Dial: [phone] [email or web address] Website: [email or web address]

## 2019-10-16 ENCOUNTER — Other Ambulatory Visit: Payer: Self-pay | Admitting: Internal Medicine

## 2019-10-19 ENCOUNTER — Ambulatory Visit: Payer: Self-pay | Admitting: Pharmacist

## 2019-10-19 ENCOUNTER — Other Ambulatory Visit: Payer: Self-pay | Admitting: Internal Medicine

## 2019-10-19 DIAGNOSIS — Z760 Encounter for issue of repeat prescription: Secondary | ICD-10-CM

## 2019-10-19 NOTE — Chronic Care Management (AMB) (Signed)
  Care Management   Note  10/19/2019 Name: Kristie Jones MRN: [id number] DOB: 1955-12-19  Kristie Jones is a 64 y.o. year old female who is a primary care patient of Derrel Nip, Aris Everts, MD and is actively engaged with the care management team. I reached out to Kristie Jones by phone today to assist with re-scheduling a follow up visit with the Pharmacist  Follow up plan: Unable to make contact on outreach attempts x 3. PCP Dr. Deborra Medina and Pharm D Alphonzo Severance  notified via routed documentation in medical record.   Noreene Larsson, [place], [place], [place] [address] Direct Dial: [phone] [email or web address] Website: [email or web address]

## 2019-10-19 NOTE — Telephone Encounter (Signed)
Refilled: 09/07/2019 Last OV: 05/18/2019 Next OV: not scheduled

## 2019-10-19 NOTE — Chronic Care Management (AMB) (Signed)
  Chronic Care Management   Note  10/19/2019 Name: SHIVA EKLOF MRN: [id number] DOB: [birth year]  [name] is a 64 y.o. year old female who is a primary care patient of [name], Aris Everts, MD. The CCM team was consulted for assistance with chronic disease management and care coordination needs.    Closing CCM case d/t inability to maintain contact. See Care Guide documentation  [name], PharmD, [place], CPP Clinical Pharmacist [place] [phone]

## 2019-10-19 NOTE — Progress Notes (Signed)
  I have reviewed the above information and agree with above.   Aizen Duval, MD 

## 2019-10-20 ENCOUNTER — Telehealth: Payer: Self-pay | Admitting: Internal Medicine

## 2019-10-20 ENCOUNTER — Ambulatory Visit: Payer: Self-pay | Admitting: Pharmacist

## 2019-10-20 NOTE — Telephone Encounter (Signed)
I wish I did. It sounds like Nothing has changed.  Has she availed herself to any of the   mental health agencies that offer free services?

## 2019-10-20 NOTE — Telephone Encounter (Signed)
Before I call her back, I would like recommendations. I anticipate she will decline an appointment w/ Dr. Derrel Nip (face to face d/t transportation barriers, and virtual d/t cost barriers), but I will still offer.   Previously has been provided resources by [facility] (see MyChart message on 06/25/19). Previously had told me she was receiving virtual therapy weekly via Zoom.   Any thoughts/recommendations?

## 2019-10-20 NOTE — Chronic Care Management (AMB) (Signed)
Pt returned my call to r/s f/u with Catie. Pt was very upset and crying, very depressed. Pt advised me that she is having family problems, financial issues filed for bankruptcy and just not doing well. I proceeded to talk to pt and asked her if she was having any thoughts of hurting herself or anyone else, Pt denies having those type of thoughts. I advised her that I would reach out to Catie to see if there is anything we can do to help.  Noreene Larsson, [place], [place], [place] [address] Direct Dial: [phone] [email or web address] Website: [email or web address]   Informed Catie of above info 10/20/2019

## 2019-10-21 NOTE — Chronic Care Management (AMB) (Signed)
  Chronic Care Management   Note  10/21/2019 Name: Kristie Jones MRN: [id number] DOB: October 06, 1955  [name] is a 64 y.o. year old female who is a primary care patient of [name], Aris Everts, MD. The CCM team was consulted for assistance with chronic disease management and care coordination needs.    Received message from Care Guide that patient needed additional mental and community support. Contacted patient, left HIPAA compliant message for her to return my call at her convenience. Will plan to review mental health resources available in [facility] that were provided to her on 07/05/20 and determine if she has been able to utilize any of these.   [name], PharmD, [place], CPP Clinical Pharmacist [place] [phone]

## 2019-10-23 ENCOUNTER — Encounter: Payer: Self-pay | Admitting: Pharmacist

## 2019-10-23 ENCOUNTER — Ambulatory Visit: Payer: Self-pay | Admitting: Pharmacist

## 2019-10-23 DIAGNOSIS — F331 Major depressive disorder, recurrent, moderate: Secondary | ICD-10-CM

## 2019-10-23 DIAGNOSIS — F419 Anxiety disorder, unspecified: Secondary | ICD-10-CM

## 2019-10-23 DIAGNOSIS — F32A Depression, unspecified: Secondary | ICD-10-CM

## 2019-10-23 DIAGNOSIS — F329 Major depressive disorder, single episode, unspecified: Secondary | ICD-10-CM
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
## 2019-10-26 ENCOUNTER — Ambulatory Visit: Payer: Self-pay | Admitting: Licensed Clinical Social Worker

## 2019-10-26 NOTE — Addendum Note (Signed)
Addended by: De Hollingshead on: 10/26/2019 10:04 AM   Modules accepted: Orders

## 2019-10-26 NOTE — Chronic Care Management (AMB) (Signed)
  Care Management   Follow Up Note   10/26/2019 Name: REILEIGH HABER MRN: [id number] DOB: March 12, 1956  Referred by: Crecencio Mc, MD Reason for referral : Care Coordination   ALAYLAH TOMASKO is a 64 y.o. year old female who is a primary care patient of Derrel Nip, Aris Everts, MD. The care management team was consulted for assistance with care management and care coordination needs.    Review of patient status, including review of consultants reports, relevant laboratory and other test results, and collaboration with appropriate care team members and the patient's provider was performed as part of comprehensive patient evaluation and provision of chronic care management services.    LCSW completed CCM outreach attempt today but was unable to reach patient successfully. A HIPPA compliant voice message was left encouraging patient to return call once available. LCSW rescheduled CCM SW appointment as well.  A HIPPA compliant phone message was left for the patient providing contact information and requesting a return call.   Eula Fried, BSW, MSW, [facility] Care Management [email or web address] Phone: [phone]

## 2019-10-28 ENCOUNTER — Telehealth: Payer: Self-pay

## 2019-10-28 NOTE — Telephone Encounter (Signed)
10/28/2019 Spoke with patient about counseling resources. Emailed patient resources at [email or web address] with her permission. Ambrose Mantle [phone]

## 2019-10-29 ENCOUNTER — Ambulatory Visit: Payer: Self-pay | Admitting: Licensed Clinical Social Worker

## 2019-10-29 NOTE — Chronic Care Management (AMB) (Signed)
  Care Management   Follow Up Note   10/29/2019 Name: Kristie Jones MRN: [id number] DOB: 1955-09-04  Referred by: Crecencio Mc, MD Reason for referral : Care Coordination   Kristie Jones is a 64 y.o. year old female who is a primary care patient of Derrel Nip, Aris Everts, MD. The care management team was consulted for assistance with care management and care coordination needs.    Review of patient status, including review of consultants reports, relevant laboratory and other test results, and collaboration with appropriate care team members and the patient's provider was performed as part of comprehensive patient evaluation and provision of chronic care management services.    LCSW completed CCM outreach attempt today but was unable to reach patient successfully. A HIPPA compliant voice message was left encouraging patient to return call once available. LCSW rescheduled CCM SW appointment as well.  A HIPPA compliant phone message was left for the patient providing contact information and requesting a return call.   Eula Fried, BSW, MSW, [facility] [facility] Care Management [email or web address] Phone: [phone]

## 2019-11-03 ENCOUNTER — Encounter: Payer: Self-pay | Admitting: Pharmacist

## 2019-11-03 ENCOUNTER — Ambulatory Visit: Payer: Self-pay | Admitting: Pharmacist

## 2019-11-03 DIAGNOSIS — E1140 Type 2 diabetes mellitus with diabetic neuropathy, unspecified: Secondary | ICD-10-CM

## 2019-11-03 DIAGNOSIS — F331 Major depressive disorder, recurrent, moderate: Secondary | ICD-10-CM
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
## 2019-11-04 ENCOUNTER — Ambulatory Visit: Payer: Self-pay | Admitting: Licensed Clinical Social Worker

## 2019-11-04 NOTE — Chronic Care Management (AMB) (Signed)
  Care Management   Follow Up Note   11/04/2019 Name: Kristie Jones MRN: [id number] DOB: Aug 20, 1955  Referred by: Crecencio Mc, MD Reason for referral : Care Coordination   Kristie Jones is a 64 y.o. year old female who is a primary care patient of Derrel Nip, Aris Everts, MD. The care management team was consulted for assistance with care management and care coordination needs.    Review of patient status, including review of consultants reports, relevant laboratory and other test results, and collaboration with appropriate care team members and the patient's provider was performed as part of comprehensive patient evaluation and provision of chronic care management services.    SDOH (Social Determinants of Health) assessments performed: Yes See Care Plan activities for detailed interventions related to [facility])     Advanced Directives: See Care Plan and Vynca application for related entries.   Goals Addressed    . I need resource education (pt-stated)       CARE PLAN ENTRY (see longtitudinal plan of care for additional care plan information)  Current Barriers:  . Financial constraints related to managing health care expenses . Limited social support . ADL IADL limitations . Mental Health Concerns  . Social Isolation  Clinical Social Work Clinical Goal(s):  Marland Kitchen Over the next 120 days, patient will demonstrate improved health management independence as evidenced byimplementing appropriate coping skills and mental health resources to alleviate depression and anxiety.   Interventions: . Patient interviewed and appropriate assessments performed . Provided mental health counseling with regard to anxiety and depression  . Discussed plans with patient for ongoing care management follow up and provided patient with direct contact information for care management team . Crisis resource education provided to patient today . Praise provided for successful implementation of positive thinking,  prayi . Assisted patient/caregiver with obtaining information about health plan benefits . LCSW received incoming return phone call from patient today on 11/04/19. Patient has been struggling with ongoing depression and anxiety and is currently taking Duloxetine 90 mg (daily) diazepam 10 mg (PRN.)  Patient has a therapist appointment tomorrow that she is looking forward to. She continues to have regular Zoom calls with this therapist through the shelter she previously stayed in. Patient reports multiple financial stressors as well and that she had to file for bankruptcy this year. Patient has been denied disability in the past but is actively working on a new application request. Patient reports that she receives ongoing socialization by visiting her neighbors regularly. Patient reports that she is currently "making a plan for her happiness." Patient is in need of mental health resource education. LCSW sent text message to patient with these resources-[facility] Crisis Line: [phone] (24/7) . [facility]: (629)788-9304 (24/7) . [facility]: [phone] 717-7504 . [place]: (479)877-1306 . [facility], [facility] [phone] .         [place] [address], [place].  Patient is agreeable to investigate these resources this week as she has not had the time to do so yet. LCSW provided emotional support to patient and implemented solution focused interventions.   Patient Self Care Activities:  . Attends all scheduled provider appointments . Calls provider office for new concerns or questions   Initial goal documentation     Eula Fried, BSW, MSW, [email or web address] Phone: [phone]

## 2019-11-04 NOTE — Chronic Care Management (AMB) (Signed)
  Care Management   Follow Up Note   11/04/2019 Name: ADILIA BYRNES MRN: [id number] DOB: Dec 23, 1955  Referred by: Crecencio Mc, MD Reason for referral : Care Coordination   KHANYA COLUNGA is a 64 y.o. year old female who is a primary care patient of Derrel Nip, Aris Everts, MD. The care management team was consulted for assistance with care management and care coordination needs.    Review of patient status, including review of consultants reports, relevant laboratory and other test results, and collaboration with appropriate care team members and the patient's provider was performed as part of comprehensive patient evaluation and provision of chronic care management services.    LCSW missed return call and voice message from patient on 11/03/19. LCSW completed CCM outreach attempt today but was unable to reach patient successfully. A HIPPA compliant voice message was left encouraging patient to return call once available. LCSW rescheduled CCM SW appointment as well.  A HIPPA compliant phone message was left for the patient providing contact information and requesting a return call.   Eula Fried, BSW, MSW, [facility] Care Management [email or web address] Phone: [phone]

## 2019-11-11 ENCOUNTER — Telehealth: Payer: Self-pay

## 2019-11-11 NOTE — Telephone Encounter (Signed)
11/11/19 Follow-up call.  Left message on voicemail for patient to return my call regarding mental health resources. Ambrose Mantle [phone]

## 2019-11-13 ENCOUNTER — Telehealth: Payer: Self-pay

## 2019-11-13 NOTE — Telephone Encounter (Signed)
11/13/19 Spoke with patient she is currently seeing a counselor that will be leaving the practice soon.  She will be referred to a new counselor as soon as one is hired.  She has the counseling resources given if she needs them for her county. Ms. Lanzi has my contact information and will contact me if she needs anything else.  Closing referral. Ambrose Mantle [phone]

## 2019-11-18 ENCOUNTER — Ambulatory Visit: Payer: Self-pay | Admitting: Internal Medicine

## 2019-11-25 ENCOUNTER — Other Ambulatory Visit: Payer: Self-pay | Admitting: Internal Medicine

## 2019-11-25 ENCOUNTER — Telehealth: Payer: Self-pay | Admitting: Internal Medicine

## 2019-11-25 NOTE — Telephone Encounter (Signed)
Pt would like a return call about disability paperwork

## 2019-11-25 NOTE — Telephone Encounter (Signed)
Refill request for PROVIGIL , last seen 10-19-19, last filled 05-05-19.  Please advise.

## 2019-11-25 NOTE — Telephone Encounter (Signed)
Provigil refill denied because it is a controlled substance (stimulant) and her last visit was in October, not march

## 2019-11-27 ENCOUNTER — Encounter: Payer: Self-pay | Admitting: *Deleted

## 2019-11-27 ENCOUNTER — Other Ambulatory Visit: Payer: Self-pay | Admitting: Internal Medicine

## 2019-11-27 NOTE — Telephone Encounter (Signed)
Pt called back about paperwork. Please call

## 2019-11-27 NOTE — Telephone Encounter (Signed)
Sent mychart message to notify patient 

## 2019-11-27 NOTE — Telephone Encounter (Signed)
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  If she does she needs to be evaluated by neurology

## 2019-11-30 ENCOUNTER — Telehealth: Payer: Self-pay

## 2019-12-02 ENCOUNTER — Other Ambulatory Visit: Payer: Self-pay

## 2019-12-02 ENCOUNTER — Encounter: Payer: Self-pay | Admitting: Internal Medicine

## 2019-12-02 ENCOUNTER — Ambulatory Visit (INDEPENDENT_AMBULATORY_CARE_PROVIDER_SITE_OTHER): Payer: Self-pay | Admitting: Internal Medicine

## 2019-12-02 VITALS — BP 144/78 | HR 108 | Temp 97.8°F | Resp 15 | Ht 61.0 in | Wt 168.6 lb

## 2019-12-02 DIAGNOSIS — Z20822 Contact with and (suspected) exposure to covid-19: Secondary | ICD-10-CM

## 2019-12-02 DIAGNOSIS — M545 Low back pain, unspecified: Secondary | ICD-10-CM

## 2019-12-02 DIAGNOSIS — M546 Pain in thoracic spine: Secondary | ICD-10-CM

## 2019-12-02 DIAGNOSIS — G4726 Circadian rhythm sleep disorder, shift work type: Secondary | ICD-10-CM

## 2019-12-02 DIAGNOSIS — N3941 Urge incontinence: Secondary | ICD-10-CM

## 2019-12-02 DIAGNOSIS — E1140 Type 2 diabetes mellitus with diabetic neuropathy, unspecified: Secondary | ICD-10-CM

## 2019-12-02 DIAGNOSIS — F331 Major depressive disorder, recurrent, moderate: Secondary | ICD-10-CM

## 2019-12-02 MED ORDER — ONDANSETRON 4 MG PO TBDP: 4.0000 mg | ORAL_TABLET | Freq: Three times a day (TID) | ORAL | 3 refills | Status: DC | PRN

## 2019-12-02 NOTE — Telephone Encounter (Signed)
Spoke with pt and she stated that she was denied for disability again and that Kristie Jones was on the denial paperwork. I explained to the pt that there is not any records in the chart about disability since May of 2020. Pt stated that she would bring the paperwork with her when she comes in for her appt this afternoon.
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
## 2019-12-02 NOTE — Patient Instructions (Signed)
Referral to psychiatry in process   Tramadol and valium refilled for 6 months    no more modafinil   Agree with walking in the pool

## 2019-12-03 NOTE — Assessment & Plan Note (Signed)
chronic.  She has been unable to continue working at [employer] in her current duties due to persistent back pain  which has resulted in considerable psychiatric distress.  continue use of tramadol for pain management

## 2019-12-03 NOTE — Assessment & Plan Note (Signed)
Managed with cymbalta 90 mg daily .  She reports stability of emotions despite increasing financial hardship

## 2019-12-03 NOTE — Assessment & Plan Note (Signed)
I have refused to refill modafanil since she has been unemployed for over a year and is taking valium to manage her insomnia.

## 2019-12-07 ENCOUNTER — Ambulatory Visit: Payer: Self-pay | Admitting: Pharmacist

## 2019-12-07 ENCOUNTER — Encounter: Payer: Self-pay | Admitting: Pharmacist

## 2019-12-07 DIAGNOSIS — F331 Major depressive disorder, recurrent, moderate: Secondary | ICD-10-CM

## 2019-12-07 DIAGNOSIS — E1140 Type 2 diabetes mellitus with diabetic neuropathy, unspecified: Secondary | ICD-10-CM

## 2019-12-07 NOTE — Chronic Care Management (AMB) (Signed)
  Chronic Care Management   Note  12/07/2019 Name: Kristie Jones MRN: [id number] DOB: [birth year]  [name] is a 64 y.o. year old female who is a primary care patient of [name], Aris Everts, MD. The CCM team was consulted for assistance with chronic disease management and care coordination needs.    Attempted to contact patient for medication management review. Left HIPAA compliant message for her to return my call at her convenience.   Noted that she reported to Dr. Derrel Nip that she sometimes has a difficult time affording healthy foods. Sent MyChart message asking if she would like for me to place a Care Guide referral for food resource support. Will await call/message back.   [name], PharmD, [place], CPP Clinical Pharmacist [place] [phone]

## 2019-12-08 ENCOUNTER — Other Ambulatory Visit: Payer: Self-pay

## 2019-12-08 ENCOUNTER — Ambulatory Visit: Payer: Self-pay | Admitting: Pharmacist

## 2019-12-08 DIAGNOSIS — E1140 Type 2 diabetes mellitus with diabetic neuropathy, unspecified: Secondary | ICD-10-CM

## 2019-12-08 DIAGNOSIS — F331 Major depressive disorder, recurrent, moderate: Secondary | ICD-10-CM

## 2019-12-08 NOTE — Chronic Care Management (AMB) (Signed)
  Chronic Care Management   Note  12/08/2019 Name: GEETA SARTORI MRN: [id number] DOB: 02-Sep-1955  [name] is a 64 y.o. year old female who is a primary care patient of [name], Aris Everts, MD. The CCM team was consulted for assistance with chronic disease management and care coordination needs.    Received message back from patient that she is interested in a call from our Care Guides for food support resources. Will place referral.   [name], PharmD, [place], CPP Clinical Pharmacist [place] [phone]

## 2019-12-10 ENCOUNTER — Other Ambulatory Visit: Payer: Self-pay

## 2019-12-10 ENCOUNTER — Other Ambulatory Visit (INDEPENDENT_AMBULATORY_CARE_PROVIDER_SITE_OTHER): Payer: Self-pay

## 2019-12-10 DIAGNOSIS — N3941 Urge incontinence: Secondary | ICD-10-CM

## 2019-12-10 DIAGNOSIS — E1140 Type 2 diabetes mellitus with diabetic neuropathy, unspecified: Secondary | ICD-10-CM

## 2019-12-10 LAB — COMPREHENSIVE METABOLIC PANEL
ALT: 22 U/L (ref 0–35)
AST: 21 U/L (ref 0–37)
Albumin: 4.5 g/dL (ref 3.5–5.2)
Alkaline Phosphatase: 129 U/L — ABNORMAL HIGH (ref 39–117)
BUN: 16 mg/dL (ref 6–23)
CO2: 33 mEq/L — ABNORMAL HIGH (ref 19–32)
Calcium: 9.9 mg/dL (ref 8.4–10.5)
Chloride: 93 mEq/L — ABNORMAL LOW (ref 96–112)
Creatinine, Ser: 0.87 mg/dL (ref 0.40–1.20)
GFR: 66.08 mL/min (ref >60.00)
Glucose, Bld: 107 mg/dL — ABNORMAL HIGH (ref 70–99)
Potassium: 4.2 mEq/L (ref 3.5–5.1)
Sodium: 134 mEq/L — ABNORMAL LOW (ref 135–145)
Total Bilirubin: 0.7 mg/dL (ref 0.2–1.2)
Total Protein: 7.4 g/dL (ref 6.0–8.3)

## 2019-12-10 LAB — URINALYSIS, ROUTINE W REFLEX MICROSCOPIC
Bilirubin Urine: NEGATIVE
Ketones, ur: NEGATIVE
Leukocytes,Ua: NEGATIVE
Nitrite: NEGATIVE
RBC / HPF: NONE SEEN (ref 0–?)
Specific Gravity, Urine: <=1.005 — AB (ref 1.000–1.030)
Total Protein, Urine: NEGATIVE
Urine Glucose: NEGATIVE
Urobilinogen, UA: 0.2 (ref 0.0–1.0)
WBC, UA: NONE SEEN (ref 0–?)
pH: 5.5 (ref 5.0–8.0)

## 2019-12-10 LAB — HEMOGLOBIN A1C: Hgb A1c MFr Bld: 6.8 % — ABNORMAL HIGH (ref 4.6–6.5)

## 2019-12-11 ENCOUNTER — Telehealth: Payer: Self-pay | Admitting: Internal Medicine

## 2019-12-11 ENCOUNTER — Other Ambulatory Visit: Payer: Self-pay | Admitting: Internal Medicine

## 2019-12-11 DIAGNOSIS — Z760 Encounter for issue of repeat prescription: Secondary | ICD-10-CM

## 2019-12-11 LAB — URINE CULTURE
MICRO NUMBER:: 10447382
SPECIMEN QUALITY:: ADEQUATE

## 2019-12-11 NOTE — Telephone Encounter (Signed)
Refill request for tramadol, last seen 12-02-19, last filled 10-20-19.  Please advise.

## 2019-12-11 NOTE — Telephone Encounter (Signed)
Patient was seen last week and Dr. Derrel Nip did not fill her medications; traMADol (ULTRAM) 50 MG tablet and diazepam (VALIUM) 10 MG tablet. Patient said Dr. Derrel Nip would fill for 6 months.

## 2019-12-14 ENCOUNTER — Telehealth: Payer: Self-pay

## 2019-12-14 NOTE — Telephone Encounter (Signed)
FYI-Caller states she was seen by PCP on Wednesday. She was told a 6 month supply of diazepam and tramadol would be called in. The medication was not called in. She called the office on Thursday, spoke with a nurse. Caller was told this would be taken care of. Patient had diazepam and zofran called in, but not the tramadol. Caller will run out of medication before Monday. Caller states the pharmacy is willing to loan her some tramadol until Monday. Caller is frustrated and she doesn't understand why this was not handled.     [facility] - Cl TELEPHONE ADVICE RECORD [facility] Patient Name: Kristie Jones Gender: Female DOB: Sep 23, 1955 Age: 64 Y 50 M 3 D Return Phone Number: [phone] (Primary), [phone] (Secondary) Address: City/State/Zip: [name] [address] Client [name] [place] Night - Cl Client Site [place] Physician Deborra Medina - MD Contact Type Call Who Is Calling Patient / Member / Family / Caregiver Call Type Triage / Clinical Relationship To Patient Self Return Phone Number [phone] (Secondary) Chief Complaint Pain - Generalized Reason for Call Symptomatic / Request for [place] says her medication was supposed to be refilled but the Tramadol was not refilled as notated, says it is (overall) pain medication and she will run out before Monday --- Caller says her pharmacy closes at 2pm Translation No Nurse Assessment Nurse: Susy Manor, RN, Megan Date/Time ([place] Time): 12/12/2019 11:04:21 AM Please select the assessment type ---RX called in but not at pharm Additional Documentation ---Caller states she was seen by PCP on Wednesday. She was told a 6 month supply of diazepam and tramadol would be called in. The medication was not called in. She called the office on Thursday, spoke with a nurse. Caller was told this would  be taken care of. Patient had diazepam and zofran called in, but not the tramadol. Caller will run out of medication before Monday. Caller states the pharmacy is willing to loan her some tramadol until Monday. Caller is frustrated and she doesn't understand why this was not handled. Document the name of the medication. ---tramadol Has the office closed within the last 30 minutes? ---No Does the client directives allow for assistance with medications after hours? ---No Additional Documentation ---I advised caller that her doctor would get a fax of this information, and to follow up with the office on Monday. Caller is requesting someone from the office call her Monday. She states she has done enough calling around. Guidelines Guideline Title Affirmed Question Affirmed Notes Nurse Date/Time Eilene Ghazi Time)PLEASE NOTE: All timestamps contained within this report are represented as [place] Standard Time. CONFIDENTIALTY NOTICE: This fax transmission is intended only for the addressee. It contains information that is legally privileged, confidential or otherwise protected from use or disclosure. If you are not the intended recipient, you are strictly prohibited from reviewing, disclosing, copying using or disseminating any of this information or taking any action in reliance on or regarding this information. If you have received this fax in error, please notify us immediately by telephone so that we can arrange for its return to [place]. Phone: [phone], Toll-Free: [phone], Fax: [phone] Page: 2 of 2 Call Id: [id number] [place]. Time Eilene Ghazi Time) Disposition Final User 12/12/2019 11:15:14 AM Pharmacy Call Susy Manor, RN, Megan Reason: Caller to get loaner dose of medication. Patient requesting a call back Monday morning. 12/12/2019 11:15:21 AM Clinical Call Yes Susy Manor, RN, Jinny Blossom Caller Disagree/Comply Comply Caller Understands Yes  PreDisposition Call Pharmacist

## 2019-12-15 NOTE — Telephone Encounter (Signed)
Called pharmacy, patient picked up medication on the tenth.

## 2019-12-15 NOTE — Telephone Encounter (Signed)
The tramadol was sent in electronically   on May 9, if you would review the chart . That should be the first thing you do,  Followed by checking with the pharmacy,  Or asking the patient  To double check with the pharmacy   Please handle as above. thanks

## 2019-12-17 ENCOUNTER — Telehealth: Payer: Self-pay | Admitting: Internal Medicine

## 2019-12-17 MED ORDER — DIAZEPAM 10 MG PO TABS: ORAL_TABLET | ORAL | 5 refills | Status: DC

## 2019-12-17 NOTE — Telephone Encounter (Signed)
Pt would like a call back regarding her visit on 12/02/19.

## 2019-12-17 NOTE — Telephone Encounter (Signed)
Called pt back and she stated that the tramadol was only sent in for 3 months with no refills and the diazepam was never sent in. She stated that it was discussed at her last appt that these would be sent in for a 6 month supply.

## 2019-12-17 NOTE — Telephone Encounter (Signed)
The tramadol will be refilled again in when the refills run out,  No worries  Diazepam was authorized and sent but for unclear reasons didn't clear the electronic authorization , so  I have sent it again

## 2019-12-21 ENCOUNTER — Other Ambulatory Visit: Payer: Self-pay | Admitting: Internal Medicine

## 2019-12-21 ENCOUNTER — Telehealth: Payer: Self-pay

## 2019-12-21 NOTE — Telephone Encounter (Signed)
12/21/19 Spoke with patient she was not at home and asked me to call her later today.  Kristie Jones [phone]

## 2019-12-21 NOTE — Telephone Encounter (Signed)
12/21/19  Spoke with patient about food pantries, [place], local [place] representative.  Patient requested that I email resources to her.  Will follow-up with patient on Thursday, May 20. Ambrose Mantle [phone]

## 2019-12-24 ENCOUNTER — Telehealth: Payer: Self-pay

## 2019-12-24 NOTE — Telephone Encounter (Signed)
12/24/19 Follow-up call.  Left message on voicemail for patient to return my call regarding resources given for Affordable Care Act, [facility] representative and local food resources. Ambrose Mantle [phone]

## 2020-01-07 ENCOUNTER — Other Ambulatory Visit: Payer: Self-pay | Admitting: Internal Medicine

## 2020-01-07 DIAGNOSIS — F32A Depression, unspecified: Secondary | ICD-10-CM

## 2020-01-14 ENCOUNTER — Other Ambulatory Visit: Payer: Self-pay | Admitting: Internal Medicine

## 2020-01-14 DIAGNOSIS — Z760 Encounter for issue of repeat prescription: Secondary | ICD-10-CM

## 2020-01-14 NOTE — Telephone Encounter (Signed)
Refill request for tramadol, last seen 12-02-19, last filled 12-13-19.  Please advise.

## 2020-03-19 ENCOUNTER — Other Ambulatory Visit: Payer: Self-pay | Admitting: Internal Medicine

## 2020-04-12 ENCOUNTER — Other Ambulatory Visit: Payer: Self-pay | Admitting: Internal Medicine

## 2020-04-23 ENCOUNTER — Other Ambulatory Visit: Payer: Self-pay | Admitting: Internal Medicine

## 2020-04-23 DIAGNOSIS — F32A Depression, unspecified: Secondary | ICD-10-CM

## 2020-06-27 ENCOUNTER — Other Ambulatory Visit: Payer: Self-pay | Admitting: Internal Medicine

## 2020-07-13 ENCOUNTER — Other Ambulatory Visit: Payer: Self-pay | Admitting: Internal Medicine

## 2020-07-13 DIAGNOSIS — Z760 Encounter for issue of repeat prescription: Secondary | ICD-10-CM

## 2020-07-14 DIAGNOSIS — Z760 Encounter for issue of repeat prescription: Secondary | ICD-10-CM

## 2020-07-14 MED ORDER — TRAMADOL HCL 50 MG PO TABS: ORAL_TABLET | ORAL | 0 refills | Status: DC

## 2020-07-14 NOTE — Telephone Encounter (Signed)
Mychart has been sent to notify patient

## 2020-07-14 NOTE — Telephone Encounter (Signed)
RX Refill:tramadol Last Seen:12-02-19 Last ordered:01-14-20

## 2020-07-14 NOTE — Telephone Encounter (Signed)
Patient called to schedule an appointment she is schedule for 08-15-2020 stated that she need a refill on her medication Tramadol

## 2020-07-14 NOTE — Telephone Encounter (Signed)
Refill denied,  Has not been seen in over 6 months,  Needs appt scheduled before I will give a 30 day

## 2020-07-21 ENCOUNTER — Other Ambulatory Visit: Payer: Self-pay | Admitting: Internal Medicine

## 2020-07-21 DIAGNOSIS — F32A Depression, unspecified: Secondary | ICD-10-CM

## 2020-07-21 DIAGNOSIS — F419 Anxiety disorder, unspecified: Secondary | ICD-10-CM

## 2020-08-15 ENCOUNTER — Other Ambulatory Visit: Payer: Self-pay

## 2020-08-15 ENCOUNTER — Ambulatory Visit (INDEPENDENT_AMBULATORY_CARE_PROVIDER_SITE_OTHER): Payer: Self-pay | Admitting: Internal Medicine

## 2020-08-15 ENCOUNTER — Encounter: Payer: Self-pay | Admitting: Internal Medicine

## 2020-08-15 VITALS — BP 128/72 | HR 117 | Temp 98.1°F | Ht 60.98 in | Wt 160.6 lb

## 2020-08-15 DIAGNOSIS — D699 Hemorrhagic condition, unspecified: Secondary | ICD-10-CM

## 2020-08-15 DIAGNOSIS — F331 Major depressive disorder, recurrent, moderate: Secondary | ICD-10-CM

## 2020-08-15 DIAGNOSIS — E1140 Type 2 diabetes mellitus with diabetic neuropathy, unspecified: Secondary | ICD-10-CM

## 2020-08-15 DIAGNOSIS — M545 Low back pain, unspecified: Secondary | ICD-10-CM

## 2020-08-15 DIAGNOSIS — Z760 Encounter for issue of repeat prescription: Secondary | ICD-10-CM

## 2020-08-15 DIAGNOSIS — E785 Hyperlipidemia, unspecified: Secondary | ICD-10-CM

## 2020-08-15 DIAGNOSIS — C4490 Unspecified malignant neoplasm of skin, unspecified: Secondary | ICD-10-CM

## 2020-08-15 DIAGNOSIS — Z85828 Personal history of other malignant neoplasm of skin: Secondary | ICD-10-CM

## 2020-08-15 DIAGNOSIS — M546 Pain in thoracic spine: Secondary | ICD-10-CM

## 2020-08-15 DIAGNOSIS — Z23 Encounter for immunization: Secondary | ICD-10-CM

## 2020-08-15 DIAGNOSIS — M47812 Spondylosis without myelopathy or radiculopathy, cervical region: Secondary | ICD-10-CM

## 2020-08-15 MED ORDER — GABAPENTIN 100 MG PO CAPS
100.0000 mg | ORAL_CAPSULE | Freq: Three times a day (TID) | ORAL | 1 refills | Status: DC
Start: 2020-08-15 — End: 2021-07-26

## 2020-08-15 MED ORDER — TRAMADOL HCL 50 MG PO TABS
ORAL_TABLET | ORAL | 5 refills | Status: DC
Start: 2020-08-15 — End: 2021-03-07

## 2020-08-15 NOTE — Patient Instructions (Addendum)
I have made a referral to [name] for help with medications and psychiatry referral   I  have refilled the tramadol and the gabapentin   Your Excedrin has aspirin in it so it may be causing your bruising

## 2020-08-15 NOTE — Progress Notes (Signed)
Subjective:  Patient ID: Kristie Jones, female    DOB: Jul 25, 1956  Age: 65 y.o. MRN: [id number]  CC: The primary encounter diagnosis was Major depressive disorder, recurrent episode, moderate ([place]). Diagnoses of Bleeds easily ([place]), Hyperlipidemia LDL goal <100, Controlled type 2 diabetes mellitus with diabetic neuropathy, without long-term current use of insulin ([place]), Skin cancer, Medication refill, Need for immunization against influenza, Need for vaccination with 13-polyvalent pneumococcal conjugate vaccine, Cervical spondylosis without myelopathy, Type 2 diabetes mellitus with diabetic neuropathy, without long-term current use of insulin ([place]), Back pain of thoracolumbar region, and Personal history of skin cancer were also pertinent to this visit.  HPI Kristie Jones presents for follow up on multiple issues including chronic back pain,  Depression and type 2 DM. Last seen over 6 months ago.   This visit occurred during the SARS-CoV-2 public health emergency.  Safety protocols were in place, including screening questions prior to the visit, additional usage of staff PPE, and extensive cleaning of exam room while observing appropriate contact time as indicated for disinfecting solutions.   Chronic back pain :  Using tramadol and cymbalta  Wants to resume gabapentin.  Using 2 tramadol every 6 hours .  Pain is aggravated by barometric changes and several subesequent falls due to loss of balance.  Scraped knees and elbows during most recent fall.   Feels that her legs are weak. PT referral deferred as she is waiting to receive a judgement on her application for disability , because she is  currently not insured.   Depression:  Positive depression screen.  Not seeing a psychiatrist despite .2 referrals made in March and May,  Says she was never called, but there are several calls from [employer] service social workers which provided her with resources.  She states that she is on a waiting list to  see a therapist throught the shelter she stayed in last year. She has returned home and is living with her husband.  She remains in conflict with him and her daughter, , but there have been no physical altercations.  She has lost her  insurance,  Going through bankruptcy.  Not sleeping well .  NOT SUICIDAL based on my discussion with her today.    T2DM:  fastings have been labile ,  As high as 200 .  Taking invokanna 100 mg daily   Foot exam normal. Requesting assistance from CCM in getting medications   History of recurrent squamous cell CA.  Has had prior Moh's procedures but shut out of Dr Roosvelt Harps practice due to failure to pay.   .  Easy bruising . takes excedrin with asa/caffeine  2 to 3 times weekly for headache management   Outpatient Medications Prior to Visit  Medication Sig Dispense Refill  . atorvastatin (LIPITOR) 40 MG tablet TAKE 1 TABLET BY MOUTH ONCE DAILY. 90 tablet 0  . canagliflozin (INVOKANA) 100 MG TABS tablet Take 1 tablet (100 mg total) by mouth daily before breakfast. 90 tablet 3  . diazepam (VALIUM) 10 MG tablet TAKE 1 TABLET AT BEDTIME AS NEEDED FOR ANXIETY 30 tablet 5  . DULoxetine (CYMBALTA) 30 MG capsule TAKE 1 TABLET BY MOUTH ONCE DAILY. (TO BE TAKEN WITH 60MG  TABLET) 90 capsule 0  . DULoxetine (CYMBALTA) 60 MG capsule TAKE (1) CAPSULE BY MOUTH ONCE DAILY. 90 capsule 0  . furosemide (LASIX) 20 MG tablet TAKE 1 TABLET BY MOUTH TWICE DAILY 180 tablet 0  . ondansetron (ZOFRAN ODT) 4 MG disintegrating tablet Take 1  tablet (4 mg total) by mouth every 8 (eight) hours as needed for nausea or vomiting. 30 tablet 3  . ONETOUCH DELICA LANCETS FINE MISC CHECK BLOOD SUGAR ONCE DAILY 100 each 6  . ONETOUCH VERIO test strip CHECK SUGAR ONCE DAILY 100 each 3  . spironolactone (ALDACTONE) 100 MG tablet TAKE 1 TABLET BY MOUTH ONCE DAILY. 90 tablet 0  . gabapentin (NEURONTIN) 100 MG capsule Take 1 capsule (100 mg total) by mouth 3 (three) times daily. 180 capsule 1  . meloxicam  (MOBIC) 7.5 MG tablet TAKE 1 TABLET AS NEEDED 90 tablet 4  . traMADol (ULTRAM) 50 MG tablet TAKE (2) TABLETS BY MOUTH EVERY SIX HOURS AS NEEDED FOR PAIN. 240 tablet 0  . Aspirin-Acetaminophen-Caffeine (EXCEDRIN MIGRAINE PO) Take by mouth as needed. (Patient not taking: Reported on 08/15/2020)     No facility-administered medications prior to visit.    Review of Systems;  Patient denies , fevers, malaise, unintentional weight loss, skin rash, eye pain, sinus congestion and sinus pain, sore throat, dysphagia,  hemoptysis , cough, dyspnea, wheezing, chest pain, palpitations, orthopnea, edema, abdominal pain, nausea, melena, diarrhea, constipation, flank pain, dysuria, hematuria, urinary  Frequency, nocturia, numbness, tingling, seizures,  Focal weakness, Loss of consciousness,  Tremor, and suicidal ideation.      Objective:  BP 128/72 (BP Location: Left Arm, Patient Position: Sitting)   Pulse (!) 117   Temp 98.1 F (36.7 C)   Ht 5' 0.98" (1.549 m)   Wt 160 lb 9.6 oz (72.8 kg)   SpO2 95%   BMI 30.36 kg/m   BP Readings from Last 3 Encounters:  08/15/20 128/72  12/02/19 (!) 144/78  05/16/18 136/68    Wt Readings from Last 3 Encounters:  08/15/20 160 lb 9.6 oz (72.8 kg)  12/02/19 168 lb 9.6 oz (76.5 kg)  05/18/19 165 lb (74.8 kg)    General appearance: alert, cooperative and appears stated age Ears: normal TM's and external ear canals both ears Throat: lips, mucosa, and tongue normal; teeth and gums normal Neck: no adenopathy, no carotid bruit, supple, symmetrical, trachea midline and thyroid not enlarged, symmetric, no tenderness/mass/nodules Back: symmetric, no curvature. ROM normal. No CVA tenderness. Lungs: clear to auscultation bilaterally Heart: regular rate and rhythm, S1, S2 normal, no murmur, click, rub or gallop Abdomen: soft, non-tender; bowel sounds normal; no masses,  no organomegaly Pulses: 2+ and symmetric Skin: Skin color, texture, turgor normal. No rashes or  lesions Lymph nodes: Cervical, supraclavicular, and axillary nodes normal. Psych:  Affect normal. Answers appropriately.    Lab Results  Component Value Date   HGBA1C 6.8 (H) 12/10/2019   HGBA1C 6.3 04/15/2019   HGBA1C 6.2 (H) 10/25/2017    Lab Results  Component Value Date   CREATININE 0.87 12/10/2019   CREATININE 0.84 04/15/2019   CREATININE 0.92 10/25/2017    Lab Results  Component Value Date   GLUCOSE 107 (H) 12/10/2019   CHOL 172 04/15/2019   TRIG 97.0 04/15/2019   HDL 62.80 04/15/2019   LDLDIRECT 79.0 11/19/2016   LDLCALC 90 04/15/2019   ALT 22 12/10/2019   AST 21 12/10/2019   NA 134 (L) 12/10/2019   K 4.2 12/10/2019   CL 93 (L) 12/10/2019   CREATININE 0.87 12/10/2019   BUN 16 12/10/2019   CO2 33 (H) 12/10/2019   TSH 2.09 07/03/2016   INR 0.9 08/15/2020   HGBA1C 6.8 (H) 12/10/2019   MICROALBUR <0.7 04/15/2019     Assessment & Plan:   Problem List Items  Addressed This Visit      Unprioritized   Back pain of thoracolumbar region     chronic.  She has been unable to continue working at [employer] in her current duties due to persistent back pain  which has resulted in considerable psychiatric distress.  continue use of tramadol for pain management .  Disability status pending       Relevant Medications   traMADol (ULTRAM) 50 MG tablet   Bleeds easily ([place])    Likely due to use of aspirin products for headaches  INR is normal and CBC is pending   Lab Results  Component Value Date   INR 0.9 08/15/2020   No results found for: WBC, HGB, HCT, MCV, PLT       Relevant Orders   CBC with Differential/Platelet   Protime-INR (Completed)   Cervical spondylosis without myelopathy    Found on April 2019 MRI ordered bv Neurosurgery due to reports of pain and numbness /tingling of arms. Left sided mild foraminal stenosis noted  At multiple levels without nerve root impingement.   No EMG studies done per patient preference, no surgery planned.        Relevant Medications   traMADol (ULTRAM) 50 MG tablet   Hyperlipidemia LDL goal <100   Relevant Orders   Lipid panel   Major depressive disorder, recurrent episode, moderate ([place]) - Primary    CCM referral again made for exploration of resources for currently uninsured patient.  She is not actively suicidal .  She is taking a regimen that was prescribed by her formed psychiatrist.  Lelon Perla. MD and is on the "waiting list" for a therapist available without charge through the [facility] shelter.       Relevant Orders   AMB Referral to [facility] Coordinaton   Personal history of skin cancer    Referral to [facility] Dermatology for evaluation of several suspicious lesions on abdomen and leg       Type 2 diabetes mellitus with diabetic neuropathy, unspecified ([place])    She is overdue for follow up . Blood sugars are at goal on 10 mg Invokanna, ,  And her neuropathy was previously  managed  with gabapentin, which we will resume today   Lab Results  Component Value Date   HGBA1C 6.8 (H) 12/10/2019   Lab Results  Component Value Date   MICROALBUR <0.7 04/15/2019           Other Visit Diagnoses    Skin cancer       Relevant Orders   Ambulatory referral to Dermatology   Medication refill       Relevant Medications   traMADol (ULTRAM) 50 MG tablet   Need for immunization against influenza       Relevant Orders   Flu Vaccine QUAD 36+ mos IM (Completed)   Need for vaccination with 13-polyvalent pneumococcal conjugate vaccine       Relevant Orders   Pneumococcal conjugate vaccine 13-valent (Completed)      I have discontinued Lasandra B. Fretwell's Aspirin-Acetaminophen-Caffeine (EXCEDRIN MIGRAINE PO) and meloxicam. I am also having her maintain her OneTouch Delica Lancets Fine, OneTouch Verio, canagliflozin, ondansetron, diazepam, furosemide, atorvastatin, spironolactone, DULoxetine, DULoxetine, gabapentin, and traMADol.  Meds ordered this encounter  Medications  . gabapentin  (NEURONTIN) 100 MG capsule    Sig: Take 1 capsule (100 mg total) by mouth 3 (three) times daily.    Dispense:  180 capsule    Refill:  1  . traMADol (ULTRAM)  50 MG tablet    Sig: TAKE (2) TABLETS BY MOUTH EVERY SIX HOURS AS NEEDED FOR PAIN.    Dispense:  240 tablet    Refill:  5    Medications Discontinued During This Encounter  Medication Reason  . Aspirin-Acetaminophen-Caffeine (EXCEDRIN MIGRAINE PO) Completed Course  . gabapentin (NEURONTIN) 100 MG capsule   . meloxicam (MOBIC) 7.5 MG tablet   . traMADol (ULTRAM) 50 MG tablet Reorder   A total of 40 minutes was spent with patient more than half of which was spent in counseling patient on the above mentioned issues , previewing and revieeing  Prior  imaging studies done, and coordination of care.  Follow-up: No follow-ups on file.   Crecencio Mc, MD

## 2020-08-16 ENCOUNTER — Telehealth: Payer: Self-pay

## 2020-08-16 ENCOUNTER — Encounter: Payer: Self-pay | Admitting: Internal Medicine

## 2020-08-16 ENCOUNTER — Other Ambulatory Visit: Payer: Self-pay | Admitting: Internal Medicine

## 2020-08-16 ENCOUNTER — Telehealth: Payer: Self-pay | Admitting: Pharmacist

## 2020-08-16 DIAGNOSIS — D582 Other hemoglobinopathies: Secondary | ICD-10-CM | POA: Insufficient documentation

## 2020-08-16 DIAGNOSIS — D699 Hemorrhagic condition, unspecified: Secondary | ICD-10-CM | POA: Insufficient documentation

## 2020-08-16 DIAGNOSIS — Z85828 Personal history of other malignant neoplasm of skin: Secondary | ICD-10-CM | POA: Insufficient documentation

## 2020-08-16 LAB — LIPID PANEL
Cholesterol: 178 mg/dL (ref 0–200)
HDL: 62.2 mg/dL (ref >39.00)
LDL Cholesterol: 86 mg/dL (ref 0–99)
NonHDL: 115.44
Total CHOL/HDL Ratio: 3
Triglycerides: 146 mg/dL (ref 0.0–149.0)
VLDL: 29.2 mg/dL (ref 0.0–40.0)

## 2020-08-16 LAB — COMPREHENSIVE METABOLIC PANEL
ALT: 30 U/L (ref 0–35)
AST: 21 U/L (ref 0–37)
Albumin: 4.6 g/dL (ref 3.5–5.2)
Alkaline Phosphatase: 131 U/L — ABNORMAL HIGH (ref 39–117)
BUN: 16 mg/dL (ref 6–23)
CO2: 30 mEq/L (ref 19–32)
Calcium: 9.8 mg/dL (ref 8.4–10.5)
Chloride: 96 mEq/L (ref 96–112)
Creatinine, Ser: 0.85 mg/dL (ref 0.40–1.20)
GFR: 73.55 mL/min (ref >60.00)
Glucose, Bld: 118 mg/dL — ABNORMAL HIGH (ref 70–99)
Potassium: 3.9 mEq/L (ref 3.5–5.1)
Sodium: 136 mEq/L (ref 135–145)
Total Bilirubin: 0.7 mg/dL (ref 0.2–1.2)
Total Protein: 7.2 g/dL (ref 6.0–8.3)

## 2020-08-16 LAB — CBC WITH DIFFERENTIAL/PLATELET
Basophils Absolute: 0.1 10*3/uL (ref 0.0–0.1)
Basophils Relative: 1.1 % (ref 0.0–3.0)
Eosinophils Absolute: 0.1 10*3/uL (ref 0.0–0.7)
Eosinophils Relative: 1 % (ref 0.0–5.0)
HCT: 51.9 % — ABNORMAL HIGH (ref 36.0–46.0)
Hemoglobin: 17.9 g/dL — ABNORMAL HIGH (ref 12.0–15.0)
Lymphocytes Relative: 25.2 % (ref 12.0–46.0)
Lymphs Abs: 2.4 10*3/uL (ref 0.7–4.0)
MCHC: 34.5 g/dL (ref 30.0–36.0)
MCV: 91 fl (ref 78.0–100.0)
Monocytes Absolute: 0.6 10*3/uL (ref 0.1–1.0)
Monocytes Relative: 6.2 % (ref 3.0–12.0)
Neutro Abs: 6.4 10*3/uL (ref 1.4–7.7)
Neutrophils Relative %: 66.5 % (ref 43.0–77.0)
Platelets: 208 10*3/uL (ref 150.0–400.0)
RBC: 5.7 Mil/uL — ABNORMAL HIGH (ref 3.87–5.11)
RDW: 13.3 % (ref 11.5–15.5)
WBC: 9.5 10*3/uL (ref 4.0–10.5)

## 2020-08-16 LAB — MICROALBUMIN / CREATININE URINE RATIO
Creatinine,U: 17.3 mg/dL
Microalb Creat Ratio: 4 mg/g (ref 0.0–30.0)
Microalb, Ur: <0.7 mg/dL (ref 0.0–1.9)

## 2020-08-16 LAB — PROTIME-INR
INR: 0.9
Prothrombin Time: 9.9 s (ref 9.0–11.5)

## 2020-08-16 LAB — HEMOGLOBIN A1C: Hgb A1c MFr Bld: 7.2 % — ABNORMAL HIGH (ref 4.6–6.5)

## 2020-08-16 NOTE — Assessment & Plan Note (Signed)
CCM referral again made for exploration of resources for currently uninsured patient.  She is not actively suicidal .  She is taking a regimen that was prescribed by her formed psychiatrist.  Lelon Perla. MD and is on the "waiting list" for a therapist available without charge through the [facility] shelter.

## 2020-08-16 NOTE — Telephone Encounter (Signed)
  Chronic Care Management   Note  08/16/2020 Name: Kristie Jones MRN: [id number] DOB: [birth year]   Attempted to contact patient for scheduled urgent appointment for medication management support. She spoke with my scheduler ~1 hour before the appointment to confirm. Left HIPAA compliant message for patient to return my call at their convenience.    Plan: - If I do not hear back from the patient by end of business today, will collaborate with Care Guide to outreach to schedule follow up with me  [name], PharmD, [place], [place] Pharmacist [employer] at [employer] [phone]

## 2020-08-16 NOTE — Assessment & Plan Note (Signed)
Likely due to use of aspirin products for headaches  INR is normal and CBC is pending   Lab Results  Component Value Date   INR 0.9 08/15/2020   No results found for: WBC, HGB, HCT, MCV, PLT

## 2020-08-16 NOTE — Assessment & Plan Note (Signed)
She is overdue for follow up . Blood sugars are at goal on 10 mg Invokanna, ,  And her neuropathy was previously  managed  with gabapentin, which we will resume today   Lab Results  Component Value Date   HGBA1C 6.8 (H) 12/10/2019   Lab Results  Component Value Date   MICROALBUR <0.7 04/15/2019

## 2020-08-16 NOTE — Assessment & Plan Note (Signed)
Referral to [facility] Dermatology for evaluation of several suspicious lesions on abdomen and leg

## 2020-08-16 NOTE — Assessment & Plan Note (Signed)
Found on April 2019 MRI ordered bv Neurosurgery due to reports of pain and numbness /tingling of arms. Left sided mild foraminal stenosis noted  At multiple levels without nerve root impingement.   No EMG studies done per patient preference, no surgery planned.

## 2020-08-16 NOTE — Chronic Care Management (AMB) (Signed)
  Chronic Care Management   Note  08/16/2020 Name: Kristie Jones MRN: [id number] DOB: [birth year]  [name] is a 65 y.o. year old female who is a primary care patient of Derrel Nip, Aris Everts, MD. Kristie Jones is currently enrolled in care management services. An additional referral for Pharm D was placed.   Follow up plan: Telephone appointment with care management team member scheduled for:08/16/2020  Noreene Larsson, [place], [place], [place] [address] Direct Dial: [phone] [email or web address]

## 2020-08-16 NOTE — Assessment & Plan Note (Signed)
chronic.  She has been unable to continue working at [employer] in her current duties due to persistent back pain  which has resulted in considerable psychiatric distress.  continue use of tramadol for pain management .  Disability status pending

## 2020-08-17 ENCOUNTER — Ambulatory Visit: Payer: Self-pay | Admitting: Pharmacist

## 2020-08-17 DIAGNOSIS — F331 Major depressive disorder, recurrent, moderate: Secondary | ICD-10-CM

## 2020-08-17 DIAGNOSIS — E1140 Type 2 diabetes mellitus with diabetic neuropathy, unspecified: Secondary | ICD-10-CM

## 2020-08-17 MED ORDER — CANAGLIFLOZIN 100 MG PO TABS: 300.0000 mg | ORAL_TABLET | Freq: Every day | ORAL | 3 refills | Status: DC

## 2020-08-17 NOTE — Patient Instructions (Signed)
Visit Information  Patient Care Plan: Medication Management    Problem Identified: Diabetes, Depression, Anxiety, Pain     Long-Range Goal: Disease Progression Prevention   This Visit's Progress: On track  Priority: High  Note:   Current Barriers:  . Unable to independently afford treatment regimen . Unable to achieve control of diabetes   Pharmacist Clinical Goal(s):  Marland Kitchen Over the next 90 days, patient will verbalize ability to afford treatment regimen through collaboration with PharmD and provider.   Interventions: . 1:1 collaboration with Crecencio Mc, MD regarding development and update of comprehensive plan of care as evidenced by provider attestation and co-signature . Inter-disciplinary care team collaboration (see longitudinal plan of care) . Comprehensive medication review performed; medication list updated in electronic medical record  Health Maintenance: . Reports that she was approved for disability on 08/10/20.  . Patient is uninsured.   Diabetes: . Uncontrolled; current treatment: Invokana 100 mg   . Current glucose readings: reports readings often >200 . Does report increased snacking, increased stress recently.  . Discussed continuing Invokana 100 mg daily with a focus on diet and exercise vs increasing to 300 mg daily. Patient elects to increase Invokana to 300 mg daily. Will apply for patient assistance for this strength.  . Will collaborate w/ CPhT to mail patient her portion of application.   Hypertension with edema: . Controlled, current regimen: furosemide 20 mg BID, spironolactone 100 mg daily . Recommended to continue current regimen at this time  Hyperlipidemia and primary ASCVD risk reduction: . Controlled, current regimen: atorvastatin 40 mg daily . Recommended to continue current regimen at this time  Depression/Anxiety, along with chronic pain . Uncontrolled; current treatment: duloxetine 90 mg daily, gabapentin 100 mg TID (had been out for a  while), diazepam 10 mg QPM PRN (using ~3-4 times weekly), tramadol 50-100 mg Q6H around the clock . Continues to report home/family stress. On wait list for psychiatry services. Previously given other local resources for underinsured/uninsured patients.  . Continue current regimen and collaboration with primary care, psychiatry at this time.  . Counseled on increased sedation w/ restarting gabapentin and diazepam together and to monitoring effects.    Patient Goals/Self-Care Activities . Over the next 90 days, patient will:  - take medications as prescribed check glucose daily, document, and provide at future appointments collaborate with provider on medication access solutions  Follow Up Plan: Telephone follow up appointment with care management team member scheduled for: ~ 6 weeks      The patient verbalized understanding of instructions, educational materials, and care plan provided today and declined offer to receive copy of patient instructions, educational materials, and care plan.   Plan: Telephone follow up appointment with care management team member scheduled for:  ~ 6 weeks  [name], PharmD, [place], [place] Clinical Pharmacist [employer] at [employer] [phone]
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
## 2020-08-31 ENCOUNTER — Other Ambulatory Visit: Payer: Self-pay

## 2020-09-07 ENCOUNTER — Other Ambulatory Visit: Payer: Self-pay

## 2020-09-07 ENCOUNTER — Other Ambulatory Visit (INDEPENDENT_AMBULATORY_CARE_PROVIDER_SITE_OTHER): Payer: Medicare Other

## 2020-09-07 ENCOUNTER — Other Ambulatory Visit: Payer: Self-pay | Admitting: Internal Medicine

## 2020-09-07 DIAGNOSIS — D582 Other hemoglobinopathies: Secondary | ICD-10-CM

## 2020-09-07 NOTE — Addendum Note (Signed)
Addended by: Tor Netters I on: 09/07/2020 02:44 PM   Modules accepted: Orders

## 2020-09-08 LAB — IBC + FERRITIN
Ferritin: 85.4 ng/mL (ref 10.0–291.0)
Iron: 127 ug/dL (ref 42–145)
Saturation Ratios: 34.2 % (ref 20.0–50.0)
Transferrin: 265 mg/dL (ref 212.0–360.0)

## 2020-09-15 ENCOUNTER — Telehealth: Payer: Self-pay | Admitting: Internal Medicine

## 2020-09-15 NOTE — Telephone Encounter (Signed)
Patient denied UC/ED at this time. She stated if it gets worse she will go to ED. She wants to know what Dr Derrel Nip thinks.

## 2020-09-15 NOTE — Telephone Encounter (Signed)
Pt is having abdominal pain that has been going on for the past 5 days. Last night and today it has gotten worse

## 2020-09-15 NOTE — Telephone Encounter (Signed)
It sounds like she has a UTI and she needs to be evaluated by UC

## 2020-09-15 NOTE — Telephone Encounter (Signed)
Patient stated her urine is very dark and she stated her bladder wall is very painful, pain is going down to legs. When she urinates towards the end of voiding she is having severe cramping that feels like menstrual cramps; she does not have and female reproductive organs. She is currently on abx for dental procedure. Patient is having some chronic discharge, but is recently darker. No blood in urine. Patient has not taken any new pain for sx just her tramadol and gabapentin. 7/10 px currently. Patient does not have OBGYN. Patient was instructed to go to UC/ED, due to no availability here in office.

## 2020-09-16 NOTE — Telephone Encounter (Signed)
Patient has been informed.

## 2020-09-19 LAB — HEMOCHROMATOSIS DNA-PCR(C282Y,H63D)

## 2020-09-20 ENCOUNTER — Encounter: Payer: Self-pay | Admitting: Internal Medicine

## 2020-09-26 ENCOUNTER — Other Ambulatory Visit: Payer: Self-pay | Admitting: Internal Medicine

## 2020-09-28 ENCOUNTER — Ambulatory Visit: Payer: Medicare Other | Admitting: Pharmacist

## 2020-09-28 ENCOUNTER — Telehealth: Payer: Self-pay

## 2020-09-28 DIAGNOSIS — E1140 Type 2 diabetes mellitus with diabetic neuropathy, unspecified: Secondary | ICD-10-CM

## 2020-09-28 DIAGNOSIS — E785 Hyperlipidemia, unspecified: Secondary | ICD-10-CM

## 2020-09-28 DIAGNOSIS — F331 Major depressive disorder, recurrent, moderate: Secondary | ICD-10-CM

## 2020-09-28 MED ORDER — CANAGLIFLOZIN 300 MG PO TABS: 300.0000 mg | ORAL_TABLET | Freq: Every day | ORAL | 1 refills | Status: DC

## 2020-09-28 NOTE — Patient Instructions (Signed)
Visit Information  Goals Addressed              This Visit's Progress     Patient Stated   .  Medication Management (pt-stated)         Patient Goals/Self-Care Activities . Over the next 90 days, patient will:  - take medications as prescribed check glucose daily, document, and provide at future appointments collaborate with provider on medication access solutions        Patient verbalizes understanding of instructions provided today and agrees to view in [place].   Plan: Telephone follow up appointment with care management team member scheduled for:  ~ 6 weeks  [name], PharmD, [place], [place] Clinical Pharmacist [employer] [facility] [employer] [phone]
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
## 2020-10-04 ENCOUNTER — Other Ambulatory Visit: Payer: Self-pay | Admitting: Internal Medicine

## 2020-10-16 ENCOUNTER — Other Ambulatory Visit: Payer: Self-pay | Admitting: Internal Medicine

## 2020-10-17 ENCOUNTER — Ambulatory Visit: Payer: Self-pay | Admitting: Internal Medicine

## 2020-10-20 ENCOUNTER — Other Ambulatory Visit: Payer: Self-pay | Admitting: Internal Medicine

## 2020-10-20 DIAGNOSIS — F32A Depression, unspecified: Secondary | ICD-10-CM

## 2020-10-20 DIAGNOSIS — F419 Anxiety disorder, unspecified: Secondary | ICD-10-CM

## 2020-11-09 ENCOUNTER — Telehealth: Payer: Self-pay | Admitting: Internal Medicine

## 2020-11-09 NOTE — Telephone Encounter (Signed)
Pt called she is wanting to be worked in. Her Blood sugars are running around 175+

## 2020-11-09 NOTE — Telephone Encounter (Signed)
Yes, I can talk to patient about this tomorrow.

## 2020-11-09 NOTE — Telephone Encounter (Signed)
It looks like pt is seeing you tomorrow

## 2020-11-11 ENCOUNTER — Ambulatory Visit (INDEPENDENT_AMBULATORY_CARE_PROVIDER_SITE_OTHER): Payer: Medicare Other | Admitting: Pharmacist

## 2020-11-11 DIAGNOSIS — E1140 Type 2 diabetes mellitus with diabetic neuropathy, unspecified: Secondary | ICD-10-CM | POA: Diagnosis not present

## 2020-11-11 DIAGNOSIS — D582 Other hemoglobinopathies: Secondary | ICD-10-CM

## 2020-11-11 DIAGNOSIS — F331 Major depressive disorder, recurrent, moderate: Secondary | ICD-10-CM

## 2020-11-11 DIAGNOSIS — G4726 Circadian rhythm sleep disorder, shift work type: Secondary | ICD-10-CM

## 2020-11-11 DIAGNOSIS — E785 Hyperlipidemia, unspecified: Secondary | ICD-10-CM

## 2020-11-11 MED ORDER — BLOOD GLUCOSE MONITOR KIT: PACK | 3 refills | Status: DC

## 2020-11-11 NOTE — Telephone Encounter (Signed)
LMTCB. Need to schedule pt for a follow up appt with Dr. Derrel Nip to discuss blood sugars.

## 2020-11-11 NOTE — Telephone Encounter (Signed)
Discussed glucose. She is extremely concerned about elevated blood sugars (~160-200s premeal), though has had glucometer for 5+ years. I am sending new script today.   Would like to reschedule f/u appointment with Dr. Derrel Nip. Janett Billow - can you call the patient to help schedule this?  She would also like hematology referral placed as discussed in last lab work. Dr. Derrel Nip, can you place this?

## 2020-11-11 NOTE — Patient Instructions (Signed)
Visit Information  PATIENT GOALS: Goals Addressed              This Visit's Progress     Patient Stated   .  Medication Management (pt-stated)        Patient Goals/Self-Care Activities . Over the next 90 days, patient will:  - take medications as prescribed check glucose daily, document, and provide at future appointments collaborate with provider on medication access solutions        Patient verbalizes understanding of instructions provided today and agrees to view in [place].   Plan: Telephone follow up appointment with care management team member scheduled for:  ~ 8 weeks  [name], PharmD, [place], [place] Clinical Pharmacist [employer] at [employer] [phone]
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
## 2020-11-12 ENCOUNTER — Other Ambulatory Visit: Payer: Self-pay | Admitting: Internal Medicine

## 2020-11-12 DIAGNOSIS — D582 Other hemoglobinopathies: Secondary | ICD-10-CM

## 2020-11-15 NOTE — Telephone Encounter (Signed)
Spoke with pt and scheduled her for an appt with Dr. Derrel Nip to discuss elevated blood sugars.

## 2020-11-15 NOTE — Telephone Encounter (Signed)
LMTCB

## 2020-11-22 ENCOUNTER — Encounter: Payer: Self-pay | Admitting: Oncology

## 2020-11-22 ENCOUNTER — Inpatient Hospital Stay: Payer: Medicare Other

## 2020-11-22 ENCOUNTER — Inpatient Hospital Stay: Payer: Medicare Other | Attending: Oncology | Admitting: Oncology

## 2020-11-22 VITALS — BP 124/78 | HR 75 | Temp 96.7°F | Resp 20 | Wt 156.9 lb

## 2020-11-22 DIAGNOSIS — F338 Other recurrent depressive disorders: Secondary | ICD-10-CM | POA: Diagnosis not present

## 2020-11-22 DIAGNOSIS — D751 Secondary polycythemia: Secondary | ICD-10-CM

## 2020-11-22 DIAGNOSIS — E119 Type 2 diabetes mellitus without complications: Secondary | ICD-10-CM | POA: Diagnosis not present

## 2020-11-22 DIAGNOSIS — E785 Hyperlipidemia, unspecified: Secondary | ICD-10-CM | POA: Insufficient documentation

## 2020-11-22 DIAGNOSIS — R5383 Other fatigue: Secondary | ICD-10-CM | POA: Diagnosis not present

## 2020-11-22 DIAGNOSIS — E1140 Type 2 diabetes mellitus with diabetic neuropathy, unspecified: Secondary | ICD-10-CM | POA: Insufficient documentation

## 2020-11-22 DIAGNOSIS — R102 Pelvic and perineal pain: Secondary | ICD-10-CM | POA: Diagnosis not present

## 2020-11-22 DIAGNOSIS — Z79899 Other long term (current) drug therapy: Secondary | ICD-10-CM | POA: Diagnosis not present

## 2020-11-22 DIAGNOSIS — Z87891 Personal history of nicotine dependence: Secondary | ICD-10-CM | POA: Insufficient documentation

## 2020-11-22 DIAGNOSIS — Z7984 Long term (current) use of oral hypoglycemic drugs: Secondary | ICD-10-CM | POA: Diagnosis not present

## 2020-11-22 LAB — CBC WITH DIFFERENTIAL/PLATELET
Abs Immature Granulocytes: 0.06 10*3/uL (ref 0.00–0.07)
Basophils Absolute: 0.1 10*3/uL (ref 0.0–0.1)
Basophils Relative: 1 %
Eosinophils Absolute: 0.1 10*3/uL (ref 0.0–0.5)
Eosinophils Relative: 0 %
HCT: 52.7 % — ABNORMAL HIGH (ref 36.0–46.0)
Hemoglobin: 18.2 g/dL — ABNORMAL HIGH (ref 12.0–15.0)
Immature Granulocytes: 0 %
Lymphocytes Relative: 14 %
Lymphs Abs: 1.9 10*3/uL (ref 0.7–4.0)
MCH: 31 pg (ref 26.0–34.0)
MCHC: 34.5 g/dL (ref 30.0–36.0)
MCV: 89.8 fL (ref 80.0–100.0)
Monocytes Absolute: 0.8 10*3/uL (ref 0.1–1.0)
Monocytes Relative: 6 %
Neutro Abs: 10.5 10*3/uL — ABNORMAL HIGH (ref 1.7–7.7)
Neutrophils Relative %: 79 %
Platelets: 236 10*3/uL (ref 150–400)
RBC: 5.87 MIL/uL — ABNORMAL HIGH (ref 3.87–5.11)
RDW: 12.6 % (ref 11.5–15.5)
WBC: 13.4 10*3/uL — ABNORMAL HIGH (ref 4.0–10.5)
nRBC: 0 % (ref 0.0–0.2)

## 2020-11-22 LAB — URINALYSIS, COMPLETE (UACMP) WITH MICROSCOPIC
Bilirubin Urine: NEGATIVE
Glucose, UA: >=500 mg/dL — AB
Ketones, ur: NEGATIVE mg/dL
Nitrite: NEGATIVE
Protein, ur: NEGATIVE mg/dL
Specific Gravity, Urine: 1.012 (ref 1.005–1.030)
pH: 7 (ref 5.0–8.0)

## 2020-11-23 ENCOUNTER — Telehealth: Payer: Self-pay | Admitting: *Deleted

## 2020-11-23 LAB — ERYTHROPOIETIN: Erythropoietin: 12.7 m[IU]/mL (ref 2.6–18.5)

## 2020-11-23 NOTE — Telephone Encounter (Signed)
Patient advised to contact Dr Lupita Dawn office about her UA, She said she will call them, she knows that Dr Derrel Nip is out of the office , but said someone is covering for her and that she has an appointment 4/29 with Dr Derrel Nip

## 2020-11-23 NOTE — Telephone Encounter (Signed)
Call returned to patient and informed of physician response and she was upset stating that she knows her body and that she has had problems with interstitial cystitis and she knows that something is not right "down there" She got tearful and stated that she guessed she is just going to have to worry for the next 2 weeks until the other results come back and she has her follow up appointment.

## 2020-11-23 NOTE — Telephone Encounter (Signed)
Her UA is not suggestive of UTI. She will need to speak to Dr. Derrel Nip about this. It is not related to her high hemoglobin

## 2020-11-23 NOTE — Telephone Encounter (Signed)
Patient called concerned about her lab results and wants to have a Virtual visit or phone call from physician today to discuss results  Urinalysis, Complete w Microscopic Order: [id number]  Status: Final result   Component Ref Range & Units 1 d ago 11 mo ago 1 yr ago  Color, Urine YELLOW YELLOWAbnormal  YELLOW R  YELLOW R   APPearance CLEAR HAZYAbnormal  CLEAR R  CLEAR R   Specific Gravity, Urine 1.005 - 1.030 1.012  <=1.005Abnormal R  1.010 R   pH 5.0 - 8.0 7.0  5.5  7.0   Glucose, UA NEGATIVE mg/dL >=500Abnormal     Hgb urine dipstick NEGATIVE MODERATEAbnormal  TRACE-INTACTAbnormal R  TRACE-INTACTAbnormal R   Bilirubin Urine NEGATIVE NEGATIVE  NEGATIVE R  NEGATIVE R   Ketones, ur NEGATIVE mg/dL NEGATIVE  NEGATIVE R  NEGATIVE R   Protein, ur NEGATIVE mg/dL NEGATIVE     Nitrite NEGATIVE NEGATIVE  NEGATIVE R  NEGATIVE R   Leukocytes,Ua NEGATIVE SMALLAbnormal  NEGATIVE R  NEGATIVE R   RBC / HPF 0 - 5 RBC/hpf 0-5  none seen R  0-2/hpf R   WBC, UA 0 - 5 WBC/hpf 0-5  none seen R  none seen R   Bacteria, UA NONE SEEN FEWAbnormal  Rare(<10/hpf)Abnormal R    Squamous Epithelial / LPF 0 - 5 0-5  Rare(0-4/hpf) R    Mucus  PRESENT     Comment: Performed at [facility], [address] [place], [place] [address]  Resulting Agency  [place]         Specimen Collected: 11/22/20 14:52 Last Resulted: 11/22/20 15:13       CBC with Differential/Platelet Order: [id number]  Status: Final result   Component Ref Range & Units 1 d ago 3 mo ago  WBC 4.0 - 10.5 K/uL 13.4High  9.5   RBC 3.87 - 5.11 MIL/uL 5.87High  5.70High R   Hemoglobin 12.0 - 15.0 g/dL 18.2High  17.9High   HCT 36.0 - 46.0 % 52.7High  51.9High   MCV 80.0 - 100.0 fL 89.8  91.0 R   MCH 26.0 - 34.0 pg 31.0    MCHC 30.0 - 36.0 g/dL 34.5  34.5   RDW 11.5 - 15.5 % 12.6  13.3   Platelets 150 - 400 K/uL 236  208.0 R   nRBC 0.0 - 0.2 % 0.0    Neutrophils  Relative % % 79  66.5 R   Neutro Abs 1.7 - 7.7 K/uL 10.5High  6.4 R   Lymphocytes Relative % 14  25.2 R   Lymphs Abs 0.7 - 4.0 K/uL 1.9  2.4   Monocytes Relative % 6  6.2 R   Monocytes Absolute 0.1 - 1.0 K/uL 0.8  0.6   Eosinophils Relative % 0  1.0 R   Eosinophils Absolute 0.0 - 0.5 K/uL 0.1  0.1 R   Basophils Relative % 1  1.1 R   Basophils Absolute 0.0 - 0.1 K/uL 0.1  0.1   Immature Granulocytes % 0    Abs Immature Granulocytes 0.00 - 0.07 K/uL 0.06    Comment: Performed at [facility] [place] [facility], [address] [place], [place] [address]  Resulting Agency  [place]         Specimen Collected: 11/22/20 14:52 Last Resulted: 11/22/20 15:17

## 2020-11-23 NOTE — Telephone Encounter (Signed)
Her hb is high but we are waiting on other tests. Will discuss all test results at f/u appt as scheduled

## 2020-11-25 ENCOUNTER — Telehealth: Payer: Self-pay

## 2020-11-25 NOTE — Telephone Encounter (Signed)
Pt called and stated that she had lab work done at [place]. Janese Banks office and she suggested to pt that she shared her results with her PCP.   Pt is aware that Dr. Derrel Nip is out of town and will not be able to look at the results until then.
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
## 2020-11-27 NOTE — Telephone Encounter (Signed)
I reviewed the UA that Dr Janese Banks did,  And agree that She does not have a UTI.  There is no evidence of inflammation , which would indicate a flare of interstitial cystitis

## 2020-11-28 NOTE — Telephone Encounter (Signed)
Please have her make an appt to address her concerns

## 2020-11-28 NOTE — Telephone Encounter (Signed)
LMTCB

## 2020-11-28 NOTE — Telephone Encounter (Signed)
Pt is concerned about her UA because it shows mucus and would like to know if that was something to be concerned about. She also wanted to see if you could take a look at her CBC.

## 2020-11-29 LAB — JAK2 EXONS 12-15

## 2020-11-29 NOTE — Telephone Encounter (Signed)
Spoke with pt and she already has an appt scheduled for Friday.

## 2020-11-30 LAB — JAK2 GENOTYPR

## 2020-12-02 ENCOUNTER — Ambulatory Visit
Admission: RE | Admit: 2020-12-02 | Discharge: 2020-12-02 | Disposition: A | Discharge status: Home / Self Care | Payer: Medicare Other | Attending: Oncology | Admitting: Oncology

## 2020-12-02 ENCOUNTER — Ambulatory Visit
Admission: RE | Admit: 2020-12-02 | Discharge: 2020-12-02 | Disposition: A | Discharge status: Home / Self Care | Payer: Medicare Other | Source: Ambulatory Visit | Attending: Oncology | Admitting: Oncology

## 2020-12-02 ENCOUNTER — Encounter: Payer: Self-pay | Admitting: Internal Medicine

## 2020-12-02 ENCOUNTER — Ambulatory Visit (INDEPENDENT_AMBULATORY_CARE_PROVIDER_SITE_OTHER): Payer: Medicare Other | Admitting: Internal Medicine

## 2020-12-02 ENCOUNTER — Other Ambulatory Visit: Payer: Self-pay

## 2020-12-02 VITALS — BP 120/66 | HR 107 | Temp 96.9°F | Resp 16 | Ht 60.0 in | Wt 156.6 lb

## 2020-12-02 DIAGNOSIS — M47814 Spondylosis without myelopathy or radiculopathy, thoracic region: Secondary | ICD-10-CM | POA: Diagnosis not present

## 2020-12-02 DIAGNOSIS — E1140 Type 2 diabetes mellitus with diabetic neuropathy, unspecified: Secondary | ICD-10-CM | POA: Diagnosis not present

## 2020-12-02 DIAGNOSIS — D751 Secondary polycythemia: Secondary | ICD-10-CM | POA: Diagnosis not present

## 2020-12-02 DIAGNOSIS — Z85828 Personal history of other malignant neoplasm of skin: Secondary | ICD-10-CM | POA: Diagnosis not present

## 2020-12-02 DIAGNOSIS — L989 Disorder of the skin and subcutaneous tissue, unspecified: Secondary | ICD-10-CM | POA: Diagnosis not present

## 2020-12-02 MED ORDER — FLUCONAZOLE 150 MG PO TABS: 150.0000 mg | ORAL_TABLET | Freq: Every day | ORAL | 0 refills | Status: DC

## 2020-12-02 NOTE — Patient Instructions (Addendum)
Your urinary symptoms are due to Invokanna  I sent fluconazole to the pharmacy to take if you develop discharge and itching,  Since yeast infections are more common  I will talk to Catie about getting you a Dex Com meter for 2 weeks, and about changing Invokanna to [place]

## 2020-12-02 NOTE — Progress Notes (Signed)
Subjective:  Patient ID: Kristie Jones, female    DOB: 12-04-1955  Age: 66 y.o. MRN: [id number]  CC: The primary encounter diagnosis was Type 2 diabetes mellitus with diabetic neuropathy, without long-term current use of insulin ([place]). Diagnoses of Personal history of skin cancer and Skin lesion of foot were also pertinent to this visit.  HPI Kristie Jones presents for FOLLOW UP ON TYPE 2 DM and recent labs done by Dr Janese Banks  This visit occurred during the SARS-CoV-2 public health emergency.  Safety protocols were in place, including screening questions prior to the visit, additional usage of staff PPE, and extensive cleaning of exam room while observing appropriate contact time as indicated for disinfecting solutions.   She has been having recurrent episodes of Vaginal discharge and irritation.  She has a history of urge and stress incontinence. Discussed referral to [facility] .  History of interstitial cystitis treated with Elmiron in the past by Dr Jeannie Fend.  Cystoscopy done in [facility] in 2014  Type 2 DM:  Taking Invokana at higher dose since January.  Has vaginal irritation .  fastings have been elevated,  But she is using alcohol to preclean her  finger and not letting it dry.  She has a new glucometer too, easier to use but still hurts to poke her finger .  Working with Catie. Darnelle Maffucci  Wants referral for skin check  [facility]  Baxter Kail for two suspicious nonhealing lesions on feet  : ring anterior ankle and left 2nd toe   Depression:  Receiving treatment but still trying to adjust to her dysfunctional  family situtation , "I can't change how people feel."     Outpatient Medications Prior to Visit  Medication Sig Dispense Refill  . atorvastatin (LIPITOR) 40 MG tablet TAKE 1 TABLET BY MOUTH ONCE DAILY. 90 tablet 0  . blood glucose meter kit and supplies KIT Dispense based on patient and insurance preference. Use up to four times daily as directed. 1 each 3  .  canagliflozin (INVOKANA) 300 MG TABS tablet Take 1 tablet (300 mg total) by mouth daily before breakfast. 90 tablet 1  . diazepam (VALIUM) 10 MG tablet TAKE 1 TABLET AT BEDTIME AS NEEDED FOR ANXIETY 30 tablet 5  . DULoxetine (CYMBALTA) 30 MG capsule TAKE 1 TABLET BY MOUTH ONCE DAILY. (TO BE TAKEN WITH 60MG TABLET) 90 capsule 0  . DULoxetine (CYMBALTA) 60 MG capsule TAKE (1) CAPSULE BY MOUTH ONCE DAILY. 90 capsule 0  . furosemide (LASIX) 20 MG tablet TAKE 1 TABLET BY MOUTH TWICE DAILY 180 tablet 0  . gabapentin (NEURONTIN) 100 MG capsule Take 1 capsule (100 mg total) by mouth 3 (three) times daily. 180 capsule 1  . ondansetron (ZOFRAN ODT) 4 MG disintegrating tablet Take 1 tablet (4 mg total) by mouth every 8 (eight) hours as needed for nausea or vomiting. 30 tablet 3  . ONETOUCH DELICA LANCETS FINE MISC CHECK BLOOD SUGAR ONCE DAILY 100 each 6  . ONETOUCH VERIO test strip CHECK SUGAR ONCE DAILY 100 each 3  . spironolactone (ALDACTONE) 100 MG tablet TAKE 1 TABLET BY MOUTH ONCE DAILY. 90 tablet 1  . traMADol (ULTRAM) 50 MG tablet TAKE (2) TABLETS BY MOUTH EVERY SIX HOURS AS NEEDED FOR PAIN. 240 tablet 5   No facility-administered medications prior to visit.    Review of Systems;  Patient denies headache, fevers, malaise, unintentional weight loss, skin rash, eye pain, sinus congestion and sinus pain, sore throat, dysphagia,  hemoptysis , cough, dyspnea, wheezing, chest pain, palpitations, orthopnea, edema, abdominal pain, nausea, melena, diarrhea, constipation, flank pain, dysuria, hematuria, urinary  Frequency, nocturia, numbness, tingling, seizures,  Focal weakness, Loss of consciousness,  Tremor, insomnia, depression, anxiety, and suicidal ideation.      Objective:  BP 120/66 (BP Location: Left Arm, Patient Position: Sitting, Cuff Size: Normal)   Pulse (!) 107   Temp (!) 96.9 F (36.1 C) (Oral)   Resp 16   Ht 5' (1.524 m)   Wt 156 lb 9.6 oz (71 kg)   SpO2 95%   BMI 30.58 kg/m   BP  Readings from Last 3 Encounters:  12/02/20 120/66  11/22/20 124/78  08/15/20 128/72    Wt Readings from Last 3 Encounters:  12/02/20 156 lb 9.6 oz (71 kg)  11/22/20 156 lb 14.4 oz (71.2 kg)  08/15/20 160 lb 9.6 oz (72.8 kg)    General appearance: alert, cooperative and appears stated age Ears: normal TM's and external ear canals both ears Throat: lips, mucosa, and tongue normal; teeth and gums normal Neck: no adenopathy, no carotid bruit, supple, symmetrical, trachea midline and thyroid not enlarged, symmetric, no tenderness/mass/nodules Back: symmetric, no curvature. ROM normal. No CVA tenderness. Lungs: clear to auscultation bilaterally Heart: regular rate and rhythm, S1, S2 normal, no murmur, click, rub or gallop Abdomen: soft, non-tender; bowel sounds normal; no masses,  no organomegaly Pulses: 2+ and symmetric Skin: Skin color, texture, turgor normal. No rashes or lesions Lymph nodes: Cervical, supraclavicular, and axillary nodes normal.  Lab Results  Component Value Date   HGBA1C 6.5 (H) 12/02/2020   HGBA1C 7.2 (H) 08/15/2020   HGBA1C 6.8 (H) 12/10/2019    Lab Results  Component Value Date   CREATININE 0.95 12/02/2020   CREATININE 0.85 08/15/2020   CREATININE 0.87 12/10/2019    Lab Results  Component Value Date   WBC 13.4 (H) 11/22/2020   HGB 18.2 (H) 11/22/2020   HCT 52.7 (H) 11/22/2020   PLT 236 11/22/2020   GLUCOSE 205 (H) 12/02/2020   CHOL 178 08/15/2020   TRIG 146.0 08/15/2020   HDL 62.20 08/15/2020   LDLDIRECT 79.0 11/19/2016   LDLCALC 86 08/15/2020   ALT 28 12/02/2020   AST 23 12/02/2020   NA 138 12/02/2020   K 3.8 12/02/2020   CL 98 12/02/2020   CREATININE 0.95 12/02/2020   BUN 17 12/02/2020   CO2 27 12/02/2020   TSH 2.09 07/03/2016   INR 0.9 08/15/2020   HGBA1C 6.5 (H) 12/02/2020   MICROALBUR <0.7 08/15/2020    No results found.  Assessment & Plan:   Problem List Items Addressed This Visit      Unprioritized   Type 2 diabetes  mellitus with diabetic neuropathy, unspecified ([place]) - Primary    Her neuropathy is managed with gabapentin.  Her diabetes is Currently well managed with Invokanna only due to metformin intolerance, but she is having vaginal discharge and irritation,  Which I will treat today with fluconazole given the glucosuria and otherwise normal UA by Hematology run several days ago.   She is willing to inject and is interested in an alternative medication given her history of interstitial cystitis. Will talk to Dr Darnelle Maffucci about Ozempic   Lab Results  Component Value Date   HGBA1C 6.5 (H) 12/02/2020         Relevant Orders   Comprehensive metabolic panel (Completed)   Hemoglobin A1c (Completed)   Personal history of skin cancer    Referring to [facility]  for two suspicious lesions on her feet.       Relevant Orders   Ambulatory referral to Dermatology    Other Visit Diagnoses    Skin lesion of foot       Relevant Orders   Ambulatory referral to Dermatology     I provided  30 minutes  during this encounter reviewing patient's current problems and past surgeries, labs and imaging studies, specifically the workup started by Dr Janese Banks,  and the urinalysis that was ran , providing counseling on the above mentioned problems I na face to face visit  , and coordination  of care .  I am having Kristie Jones start on fluconazole. I am also having her maintain her OneTouch Delica Lancets Fine, OneTouch Verio, ondansetron, diazepam, gabapentin, traMADol, atorvastatin, canagliflozin, furosemide, spironolactone, DULoxetine, DULoxetine, and blood glucose meter kit and supplies.  Meds ordered this encounter  Medications  . fluconazole (DIFLUCAN) 150 MG tablet    Sig: Take 1 tablet (150 mg total) by mouth daily.    Dispense:  2 tablet    Refill:  0    There are no discontinued medications.  Follow-up: Return in about 6 months (around 06/03/2021).   Crecencio Mc, MD

## 2020-12-03 ENCOUNTER — Encounter: Payer: Self-pay | Admitting: Internal Medicine

## 2020-12-03 LAB — HEMOGLOBIN A1C
Hgb A1c MFr Bld: 6.5 % of total Hgb — ABNORMAL HIGH (ref <5.7)
Mean Plasma Glucose: 140 mg/dL
eAG (mmol/L): 7.7 mmol/L

## 2020-12-03 LAB — COMPREHENSIVE METABOLIC PANEL
AG Ratio: 1.8 (calc) (ref 1.0–2.5)
ALT: 28 U/L (ref 6–29)
AST: 23 U/L (ref 10–35)
Albumin: 4.4 g/dL (ref 3.6–5.1)
Alkaline phosphatase (APISO): 131 U/L (ref 37–153)
BUN: 17 mg/dL (ref 7–25)
CO2: 27 mmol/L (ref 20–32)
Calcium: 9.7 mg/dL (ref 8.6–10.4)
Chloride: 98 mmol/L (ref 98–110)
Creat: 0.95 mg/dL (ref 0.50–0.99)
Globulin: 2.4 g/dL (calc) (ref 1.9–3.7)
Glucose, Bld: 205 mg/dL — ABNORMAL HIGH (ref 65–99)
Potassium: 3.8 mmol/L (ref 3.5–5.3)
Sodium: 138 mmol/L (ref 135–146)
Total Bilirubin: 0.5 mg/dL (ref 0.2–1.2)
Total Protein: 6.8 g/dL (ref 6.1–8.1)

## 2020-12-03 NOTE — Progress Notes (Signed)
   Your diabetes remains under excellent control  And your  other labs are also normal. Please continue your current medications until you can follow up witand h Catie to discuss change from Invokanna to [place]  make sure you are seeing your eye doctor at least once a year for a dilated retina exam to monitor for diabetic retinopathy,. changes that can lead to blindness .   Regards,   Deborra Medina, MD

## 2020-12-03 NOTE — Assessment & Plan Note (Addendum)
Her neuropathy is managed with gabapentin.  Her diabetes is Currently well managed with Invokanna only due to metformin intolerance, but she is having vaginal discharge and irritation,  Which I will treat today with fluconazole given the glucosuria and otherwise normal UA by Hematology run several days ago.   She is willing to inject and is interested in an alternative medication given her history of interstitial cystitis. Will talk to Dr Darnelle Maffucci about Ozempic   Lab Results  Component Value Date   HGBA1C 6.5 (H) 12/02/2020

## 2020-12-03 NOTE — Assessment & Plan Note (Signed)
Referring to [facility] for two suspicious lesions on her feet.

## 2020-12-06 ENCOUNTER — Encounter: Payer: Self-pay | Admitting: Oncology

## 2020-12-06 ENCOUNTER — Inpatient Hospital Stay: Payer: Medicare Other | Attending: Oncology | Admitting: Oncology

## 2020-12-06 DIAGNOSIS — D751 Secondary polycythemia: Secondary | ICD-10-CM

## 2020-12-06 NOTE — Progress Notes (Signed)
Patient to get lab results from first visit. No concerns to share with md

## 2020-12-10 NOTE — Progress Notes (Signed)
I connected with Kristie Jones on 12/10/20 at 11:30 AM EDT by video enabled telemedicine visit and verified that I am speaking with the correct person using two identifiers.   I discussed the limitations, risks, security and privacy concerns of performing an evaluation and management service by telemedicine and the availability of in-person appointments. I also discussed with the patient that there may be a patient responsible charge related to this service. The patient expressed understanding and agreed to proceed.  Other persons participating in the visit and their role in the encounter:  none  Patient's location:  home Provider's location:  work  Risk analyst Complaint: Discuss results of blood work  History of present illness: Patient is a 65 year old female who has a longstanding history of smoking, type 2 diabetes, hyperlipidemia who has been referred for polycythemia.  Her most recent CBC from 08/15/2020 showed an H&H of 17.9/51.9 with a normal white count and a platelet count.  Results of blood work from 12/02/2020 and 11/22/2020 were as follows: CBC showed white count of 13.4, H&H of 18.2/52.7 and a platelet count of 236.  JAK2 mutation negative.  Exon 12 negative.  EPO levels normal.  Urinalysis showed no hematuria.  Interval history patient has ongoing genitourinary complaints and vaginal discharge/irritation.  She has had interstitial cystitis in the past and has seen urology as well.  She follows up with Dr. Derrel Nip   Review of Systems  Constitutional: Positive for malaise/fatigue. Negative for chills, fever and weight loss.  HENT: Negative for congestion, ear discharge and nosebleeds.   Eyes: Negative for blurred vision.  Respiratory: Negative for cough, hemoptysis, sputum production, shortness of breath and wheezing.   Cardiovascular: Negative for chest pain, palpitations, orthopnea and claudication.  Gastrointestinal: Negative for abdominal pain, blood in stool, constipation, diarrhea,  heartburn, melena, nausea and vomiting.  Genitourinary: Negative for dysuria, flank pain, frequency, hematuria and urgency.       Vaginal discharge  Musculoskeletal: Negative for back pain, joint pain and myalgias.  Skin: Negative for rash.  Neurological: Negative for dizziness, tingling, focal weakness, seizures, weakness and headaches.  Endo/Heme/Allergies: Does not bruise/bleed easily.  Psychiatric/Behavioral: Negative for depression and suicidal ideas. The patient does not have insomnia.     Allergies  Allergen Reactions  . Ciprofloxacin Rash  . Penicillins Rash  . Sulfamethoxazole-Trimethoprim Rash    Other Reaction: Other reaction  . Cefdinir Other (See Comments)    BLACK STOOLS/YEAST INFECTION  . Iodinated Diagnostic Agents Other (See Comments)    MIGRAINE  . Methocarbamol   . Levaquin [Levofloxacin] Rash    Past Medical History:  Diagnosis Date  . Allergy   . Arthritis   . Cancer (HCC)    MULTIPLE SQUAMOUS CELL-WRIST, BUTTOCKS, ABDOMEN, LIP  . Chicken pox   . Complication of anesthesia    PT GETS VERY ANXIOUS PRIOR TO ANESTHESIA AND WILL START JERKING MASK OFF  . Depression   . Diabetes mellitus without complication ([place])   . Endometriosis   . Headache   . Lower extremity edema     Past Surgical History:  Procedure Laterality Date  . ABDOMINAL HYSTERECTOMY    . CYSTOSCOPY     X3  . DIAGNOSTIC LAPAROSCOPY    . MOHS SURGERY    . PAROTIDECTOMY Left 06/20/2015   Procedure: PAROTIDECTOMY;  Surgeon: Margaretha Sheffield, MD;  Location: [facility] ORS;  Service: ENT;  Laterality: Left;    Social History   Socioeconomic History  . Marital status: Married    Spouse name: Not  on file  . Number of children: Not on file  . Years of education: Not on file  . Highest education level: Not on file  Occupational History  . Occupation: unemployed    Comment: lost job 04/03/2018  Tobacco Use  . Smoking status: Current Every Day Smoker    Packs/day: 0.50    Years: 20.00    Pack  years: 10.00    Types: Cigarettes  . Smokeless tobacco: Never Used  Vaping Use  . Vaping Use: Former  Substance and Sexual Activity  . Alcohol use: Yes    Alcohol/week: 0.0 standard drinks    Comment: Rarely  . Drug use: No  . Sexual activity: Yes  Other Topics Concern  . Not on file  Social History Narrative  . Not on file   Social Determinants of Health   Financial Resource Strain: High Risk  . Difficulty of Paying Living Expenses: Very hard  Food Insecurity: Not on file  Transportation Needs: Not on file  Physical Activity: Not on file  Stress: Stress Concern Present  . Feeling of Stress : Rather much  Social Connections: Not on file  Intimate Partner Violence: Not on file    Family History  Problem Relation Age of Onset  . Arthritis Mother   . Hypertension Mother   . Pancreatic cancer Maternal Aunt   . Bone cancer Maternal Aunt   . Diabetes Mellitus I Maternal Uncle        CAD   . Vasculitis Maternal Aunt        Wegener's      Current Outpatient Medications:  .  atorvastatin (LIPITOR) 40 MG tablet, TAKE 1 TABLET BY MOUTH ONCE DAILY., Disp: 90 tablet, Rfl: 0 .  blood glucose meter kit and supplies KIT, Dispense based on patient and insurance preference. Use up to four times daily as directed., Disp: 1 each, Rfl: 3 .  canagliflozin (INVOKANA) 300 MG TABS tablet, Take 1 tablet (300 mg total) by mouth daily before breakfast., Disp: 90 tablet, Rfl: 1 .  diazepam (VALIUM) 10 MG tablet, TAKE 1 TABLET AT BEDTIME AS NEEDED FOR ANXIETY, Disp: 30 tablet, Rfl: 5 .  DULoxetine (CYMBALTA) 30 MG capsule, TAKE 1 TABLET BY MOUTH ONCE DAILY. (TO BE TAKEN WITH 60MG TABLET), Disp: 90 capsule, Rfl: 0 .  DULoxetine (CYMBALTA) 60 MG capsule, TAKE (1) CAPSULE BY MOUTH ONCE DAILY., Disp: 90 capsule, Rfl: 0 .  fluconazole (DIFLUCAN) 150 MG tablet, Take 1 tablet (150 mg total) by mouth daily., Disp: 2 tablet, Rfl: 0 .  furosemide (LASIX) 20 MG tablet, TAKE 1 TABLET BY MOUTH TWICE DAILY,  Disp: 180 tablet, Rfl: 0 .  gabapentin (NEURONTIN) 100 MG capsule, Take 1 capsule (100 mg total) by mouth 3 (three) times daily., Disp: 180 capsule, Rfl: 1 .  ondansetron (ZOFRAN ODT) 4 MG disintegrating tablet, Take 1 tablet (4 mg total) by mouth every 8 (eight) hours as needed for nausea or vomiting., Disp: 30 tablet, Rfl: 3 .  ONETOUCH DELICA LANCETS FINE MISC, CHECK BLOOD SUGAR ONCE DAILY, Disp: 100 each, Rfl: 6 .  ONETOUCH VERIO test strip, CHECK SUGAR ONCE DAILY, Disp: 100 each, Rfl: 3 .  spironolactone (ALDACTONE) 100 MG tablet, TAKE 1 TABLET BY MOUTH ONCE DAILY., Disp: 90 tablet, Rfl: 1 .  traMADol (ULTRAM) 50 MG tablet, TAKE (2) TABLETS BY MOUTH EVERY SIX HOURS AS NEEDED FOR PAIN., Disp: 240 tablet, Rfl: 5  DG Chest 2 View  Result Date: 12/05/2020 CLINICAL DATA:  Polycythemia.  Rule  out lung disease. EXAM: CHEST - 2 VIEW COMPARISON:  None. FINDINGS: The heart is normal in size.The cardiomediastinal contours are normal. The lungs are clear. Pulmonary vasculature is normal. No consolidation, pleural effusion, or pneumothorax. Mild thoracic spondylosis with endplate spurring. No acute osseous abnormalities are seen. IMPRESSION: Unremarkable radiographs of the chest. No evidence of acute or chronic pulmonary process. Electronically Signed   By: Keith Rake M.D.   On: 12/05/2020 11:51    No images are attached to the encounter.   CMP Latest Ref Rng & Units 12/02/2020  Glucose 65 - 99 mg/dL 205(H)  BUN 7 - 25 mg/dL 17  Creatinine 0.50 - 0.99 mg/dL 0.95  Sodium 135 - 146 mmol/L 138  Potassium 3.5 - 5.3 mmol/L 3.8  Chloride 98 - 110 mmol/L 98  CO2 20 - 32 mmol/L 27  Calcium 8.6 - 10.4 mg/dL 9.7  Total Protein 6.1 - 8.1 g/dL 6.8  Total Bilirubin 0.2 - 1.2 mg/dL 0.5  Alkaline Phos 39 - 117 U/L -  AST 10 - 35 U/L 23  ALT 6 - 29 U/L 28   CBC Latest Ref Rng & Units 11/22/2020  WBC 4.0 - 10.5 K/uL 13.4(H)  Hemoglobin 12.0 - 15.0 g/dL 18.2(H)  Hematocrit 36.0 - 46.0 % 52.7(H)   Platelets 150 - 400 K/uL 236     Observation/objective: Appears in no acute distress over video visit today.  Breathing is nonlabored  Assessment and plan: Patient is a 65 year old female with history of polycythemia likely secondary due to smoking  Discussed the results of blood work with the patient which did not show any evidence of JAK2 exon 12 mutation.  EPO levels were negative.  H&H is elevated and most recent H&H was 18.2/52.7.  She does not require a phlebotomy at this time and I suspect this is secondary to smoking.  We will consider phlebotomy when hematocrit is greater than 55.  We will check CBC with differential, CAL R, MPL mutation in 6 weeks and I will see her back in 12 weeks with a CBC with differential  Follow-up instructions: As above  I discussed the assessment and treatment plan with the patient. The patient was provided an opportunity to ask questions and all were answered. The patient agreed with the plan and demonstrated an understanding of the instructions.   The patient was advised to call back or seek an in-person evaluation if the symptoms worsen or if the condition fails to improve as anticipated.  Visit Diagnosis: 1. Secondary polycythemia     Dr. Randa Evens, MD, MPH [facility] Tel- [phone] 12/10/2020 6:01 PM

## 2020-12-21 DIAGNOSIS — Z85828 Personal history of other malignant neoplasm of skin: Secondary | ICD-10-CM | POA: Insufficient documentation

## 2020-12-31 DIAGNOSIS — K52832 Lymphocytic colitis: Secondary | ICD-10-CM | POA: Insufficient documentation

## 2021-01-02 ENCOUNTER — Other Ambulatory Visit: Payer: Self-pay | Admitting: Internal Medicine

## 2021-01-13 ENCOUNTER — Ambulatory Visit (INDEPENDENT_AMBULATORY_CARE_PROVIDER_SITE_OTHER): Payer: Medicare Other | Admitting: Pharmacist

## 2021-01-13 DIAGNOSIS — D582 Other hemoglobinopathies: Secondary | ICD-10-CM | POA: Diagnosis not present

## 2021-01-13 DIAGNOSIS — E785 Hyperlipidemia, unspecified: Secondary | ICD-10-CM | POA: Diagnosis not present

## 2021-01-13 DIAGNOSIS — F331 Major depressive disorder, recurrent, moderate: Secondary | ICD-10-CM | POA: Diagnosis not present

## 2021-01-13 DIAGNOSIS — E1140 Type 2 diabetes mellitus with diabetic neuropathy, unspecified: Secondary | ICD-10-CM

## 2021-01-13 NOTE — Patient Instructions (Signed)
Visit Information  PATIENT GOALS:  Goals Addressed               This Visit's Progress     Patient Stated     Medication Management (pt-stated)        Patient Goals/Self-Care Activities Over the next 90 days, patient will:  - take medications as prescribed check glucose daily, document, and provide at future appointments collaborate with provider on medication access solutions          Patient verbalizes understanding of instructions provided today and agrees to view in [place].    Plan: Telephone follow up appointment with care management team member scheduled for:  ~6 weeks  [name], PharmD, [place], [place] Clinical Pharmacist [employer] at [employer] [phone]
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
## 2021-01-17 ENCOUNTER — Inpatient Hospital Stay: Payer: Medicare Other | Attending: Oncology

## 2021-01-17 DIAGNOSIS — D751 Secondary polycythemia: Secondary | ICD-10-CM | POA: Diagnosis present

## 2021-01-17 LAB — CBC WITH DIFFERENTIAL/PLATELET
Abs Immature Granulocytes: 0.04 10*3/uL (ref 0.00–0.07)
Basophils Absolute: 0.1 10*3/uL (ref 0.0–0.1)
Basophils Relative: 1 %
Eosinophils Absolute: 0.1 10*3/uL (ref 0.0–0.5)
Eosinophils Relative: 1 %
HCT: 48.4 % — ABNORMAL HIGH (ref 36.0–46.0)
Hemoglobin: 16.8 g/dL — ABNORMAL HIGH (ref 12.0–15.0)
Immature Granulocytes: 0 %
Lymphocytes Relative: 19 %
Lymphs Abs: 1.7 10*3/uL (ref 0.7–4.0)
MCH: 31.1 pg (ref 26.0–34.0)
MCHC: 34.7 g/dL (ref 30.0–36.0)
MCV: 89.6 fL (ref 80.0–100.0)
Monocytes Absolute: 0.7 10*3/uL (ref 0.1–1.0)
Monocytes Relative: 7 %
Neutro Abs: 6.6 10*3/uL (ref 1.7–7.7)
Neutrophils Relative %: 72 %
Platelets: 215 10*3/uL (ref 150–400)
RBC: 5.4 MIL/uL — ABNORMAL HIGH (ref 3.87–5.11)
RDW: 12.5 % (ref 11.5–15.5)
WBC: 9.1 10*3/uL (ref 4.0–10.5)
nRBC: 0 % (ref 0.0–0.2)

## 2021-01-19 ENCOUNTER — Other Ambulatory Visit: Payer: Self-pay | Admitting: Internal Medicine

## 2021-01-19 DIAGNOSIS — F32A Depression, unspecified: Secondary | ICD-10-CM

## 2021-01-24 LAB — MPL MUTATION ANALYSIS

## 2021-01-24 LAB — CALRETICULIN (CALR) MUTATION ANALYSIS

## 2021-01-25 ENCOUNTER — Telehealth: Payer: Self-pay

## 2021-01-25 ENCOUNTER — Other Ambulatory Visit: Payer: Medicare Other

## 2021-01-25 ENCOUNTER — Other Ambulatory Visit: Payer: Self-pay | Admitting: Internal Medicine

## 2021-01-25 DIAGNOSIS — R197 Diarrhea, unspecified: Secondary | ICD-10-CM

## 2021-01-25 DIAGNOSIS — E1140 Type 2 diabetes mellitus with diabetic neuropathy, unspecified: Secondary | ICD-10-CM

## 2021-01-25 DIAGNOSIS — Z20822 Contact with and (suspected) exposure to covid-19: Secondary | ICD-10-CM

## 2021-01-25 NOTE — Telephone Encounter (Signed)
Spoken to patient. Set her up for lab appointmnet, patients appointment would have been on Monday of next week. Patient did not wish to wait that long thinking she needs to be seen today. She is canceling her lab appointment and virtual appointment. [facility] tobe [facility].

## 2021-01-25 NOTE — Telephone Encounter (Signed)
SHE NEEDS TO MAKE AN APPT.  IF SHE REFUSES TO SEE ANYONE ELSE,  SHE WILL HAVE TO WAIT UNTIL I HAVE AN APPT.    I HAVE ORDERED DIARRHEA TESTS THAT SHE NEEDS TO DO BEFORE HER APPT   [facility] PLEASE SCHEDULE HER A LAB VISIT.

## 2021-01-25 NOTE — Telephone Encounter (Signed)
Patients prefers to see PCP. Refused to go anywhere else per access nurse.

## 2021-01-27 ENCOUNTER — Other Ambulatory Visit: Payer: Self-pay

## 2021-01-27 NOTE — Telephone Encounter (Signed)
FYI I talked to patient who did go to [facility] walk-in two days & was prescribed Levsin as well instructed to take imodium. Pt has not started the Levsin since husband had not yet picked up. She stated that he planned to today so that she can start. She said that she is weak, laying in bed & at the end of he rope. I did schedule her for labs Monday since this is on going, but advised that due to her weakness & feeling so poorly that she needed to be seen at ED. Pt stated that she would consider this. She said that her diarrhea has gotten to the point of her eating & not even making it to the bathroom before it runs through her. She said that the imodium has provided minimal relief.

## 2021-01-27 NOTE — Telephone Encounter (Signed)
Pt would like a call, she stated that she is still having sever diarrhea and needs to know what to do  She did go to UC the other day

## 2021-01-30 ENCOUNTER — Other Ambulatory Visit: Payer: Self-pay

## 2021-01-30 ENCOUNTER — Telehealth: Payer: Medicare Other | Admitting: Family

## 2021-01-30 ENCOUNTER — Other Ambulatory Visit (INDEPENDENT_AMBULATORY_CARE_PROVIDER_SITE_OTHER): Payer: Medicare Other

## 2021-01-30 DIAGNOSIS — E1140 Type 2 diabetes mellitus with diabetic neuropathy, unspecified: Secondary | ICD-10-CM

## 2021-01-30 DIAGNOSIS — R197 Diarrhea, unspecified: Secondary | ICD-10-CM

## 2021-01-31 LAB — COMPREHENSIVE METABOLIC PANEL
ALT: 29 U/L (ref 0–35)
AST: 24 U/L (ref 0–37)
Albumin: 4.6 g/dL (ref 3.5–5.2)
Alkaline Phosphatase: 115 U/L (ref 39–117)
BUN: 13 mg/dL (ref 6–23)
CO2: 30 mEq/L (ref 19–32)
Calcium: 10.2 mg/dL (ref 8.4–10.5)
Chloride: 92 mEq/L — ABNORMAL LOW (ref 96–112)
Creatinine, Ser: 0.83 mg/dL (ref 0.40–1.20)
GFR: 75.44 mL/min (ref >60.00)
Glucose, Bld: 129 mg/dL — ABNORMAL HIGH (ref 70–99)
Potassium: 3.3 mEq/L — ABNORMAL LOW (ref 3.5–5.1)
Sodium: 135 mEq/L (ref 135–145)
Total Bilirubin: 0.8 mg/dL (ref 0.2–1.2)
Total Protein: 7.2 g/dL (ref 6.0–8.3)

## 2021-01-31 LAB — HEMOGLOBIN A1C: Hgb A1c MFr Bld: 6.9 % — ABNORMAL HIGH (ref 4.6–6.5)

## 2021-01-31 LAB — CBC WITH DIFFERENTIAL/PLATELET
Basophils Absolute: 0.1 10*3/uL (ref 0.0–0.1)
Basophils Relative: 1.3 % (ref 0.0–3.0)
Eosinophils Absolute: 0.1 10*3/uL (ref 0.0–0.7)
Eosinophils Relative: 0.9 % (ref 0.0–5.0)
HCT: 51.8 % — ABNORMAL HIGH (ref 36.0–46.0)
Hemoglobin: 17.8 g/dL — ABNORMAL HIGH (ref 12.0–15.0)
Lymphocytes Relative: 17.1 % (ref 12.0–46.0)
Lymphs Abs: 1.9 10*3/uL (ref 0.7–4.0)
MCHC: 34.3 g/dL (ref 30.0–36.0)
MCV: 91.2 fl (ref 78.0–100.0)
Monocytes Absolute: 0.7 10*3/uL (ref 0.1–1.0)
Monocytes Relative: 6.6 % (ref 3.0–12.0)
Neutro Abs: 8.2 10*3/uL — ABNORMAL HIGH (ref 1.4–7.7)
Neutrophils Relative %: 74.1 % (ref 43.0–77.0)
Platelets: 226 10*3/uL (ref 150.0–400.0)
RBC: 5.68 Mil/uL — ABNORMAL HIGH (ref 3.87–5.11)
RDW: 12.9 % (ref 11.5–15.5)
WBC: 11.1 10*3/uL — ABNORMAL HIGH (ref 4.0–10.5)

## 2021-01-31 LAB — MAGNESIUM: Magnesium: 1.7 mg/dL (ref 1.5–2.5)

## 2021-02-02 MED ORDER — POTASSIUM CHLORIDE CRYS ER 20 MEQ PO TBCR: 20.0000 meq | EXTENDED_RELEASE_TABLET | Freq: Every day | ORAL | 0 refills | Status: DC

## 2021-02-02 NOTE — Addendum Note (Signed)
Addended by: Crecencio Mc on: 02/02/2021 07:55 PM   Modules accepted: Orders

## 2021-02-09 ENCOUNTER — Other Ambulatory Visit
Admission: RE | Admit: 2021-02-09 | Discharge: 2021-02-09 | Disposition: A | Discharge status: Home / Self Care | Payer: Medicare Other | Source: Ambulatory Visit | Attending: Internal Medicine | Admitting: Internal Medicine

## 2021-02-09 DIAGNOSIS — R197 Diarrhea, unspecified: Secondary | ICD-10-CM | POA: Diagnosis present

## 2021-02-09 LAB — GASTROINTESTINAL PANEL BY PCR, STOOL (REPLACES STOOL CULTURE)

## 2021-02-13 ENCOUNTER — Ambulatory Visit: Payer: Self-pay | Admitting: Internal Medicine

## 2021-02-14 ENCOUNTER — Telehealth: Payer: Self-pay

## 2021-02-14 DIAGNOSIS — K591 Functional diarrhea: Secondary | ICD-10-CM

## 2021-02-14 NOTE — Telephone Encounter (Signed)
Pt called and wanted to speak with you about going to GI. I asked her for more information and she states that she does not have the energy to tell two people the same thing. She did state that she has had diarrhea on and off for months now and Dr Derrel Nip wanted to do a GI panel prior to referring to a GI doctor. She states that she is ready now but has question first. Did not want to be triaged. Please advise on house phone [phone]

## 2021-02-15 NOTE — Telephone Encounter (Signed)
Pt is aware referral has been placed. 

## 2021-02-15 NOTE — Telephone Encounter (Signed)
Spoke with pt and she stated that the diarrhea has persisted and would like to go forward with the GI referral.

## 2021-02-16 ENCOUNTER — Encounter: Payer: Self-pay | Admitting: *Deleted

## 2021-02-24 ENCOUNTER — Telehealth: Payer: Self-pay | Admitting: Internal Medicine

## 2021-02-24 NOTE — Telephone Encounter (Signed)
Patient has a referral for [facility] called and made an appointment, her appointment is not until late September. Patient asked why so far out and they told her that she was not a high prior case.Is there somewhere else that could get her in sooner?

## 2021-02-27 ENCOUNTER — Telehealth: Payer: Self-pay | Admitting: *Deleted

## 2021-02-27 NOTE — Telephone Encounter (Signed)
Patient stated that she is concerned about going on her vacation the second week in august because of her symptoms. She is wondering if you will fill her levsin until she sees GI. She does not take everyday but will run out prior to September

## 2021-02-27 NOTE — Telephone Encounter (Signed)
I got a message that pt called and asking why she has to have another cbc. Dr. Janese Banks said what is the issue. I called pt and it was a financial thing. Dr. Janese Banks reviewed the last one that was last part  of June and she was not having issues. Dr Janese Banks states that she can get labs in October and get labs on same day. I told pt and she was thankful for the change. I have spoke to Lynn our scheduler and she will give her another appt for Oct. And cancelling the appts. From this week

## 2021-02-27 NOTE — Telephone Encounter (Signed)
Pt returned your call.  

## 2021-02-27 NOTE — Telephone Encounter (Signed)
LMTCB

## 2021-02-28 ENCOUNTER — Inpatient Hospital Stay: Payer: Medicare Other | Admitting: Oncology

## 2021-02-28 ENCOUNTER — Inpatient Hospital Stay: Payer: Medicare Other

## 2021-02-28 MED ORDER — HYOSCYAMINE SULFATE 0.125 MG PO TABS: 0.1250 mg | ORAL_TABLET | ORAL | 2 refills | Status: DC | PRN

## 2021-02-28 NOTE — Telephone Encounter (Signed)
Hyoscyamine is not on her current or historical medication list.  Who prescribed it for her?

## 2021-02-28 NOTE — Telephone Encounter (Signed)
LMTCB

## 2021-02-28 NOTE — Addendum Note (Signed)
Addended by: Crecencio Mc on: 02/28/2021 01:11 PM   Modules accepted: Orders

## 2021-02-28 NOTE — Telephone Encounter (Signed)
RX SENT

## 2021-02-28 NOTE — Telephone Encounter (Signed)
Pt is aware that rx has been sent in.

## 2021-02-28 NOTE — Addendum Note (Signed)
Addended by: Crecencio Mc on: 02/28/2021 08:21 AM   Modules accepted: Orders

## 2021-02-28 NOTE — Telephone Encounter (Signed)
Patient returned [place] phone call.

## 2021-02-28 NOTE — Telephone Encounter (Signed)
Spoke with pt and she stated that she was seen at [facility] on 01/31/2021 and they prescribed it. Pt stated that they sent in just a 30 day supply, she does not take them every day but would like to have a rx to take with her when she goes on vacation. The rx was written for 0.125 mg to take every 4 hours.

## 2021-03-06 ENCOUNTER — Other Ambulatory Visit: Payer: Self-pay | Admitting: Internal Medicine

## 2021-03-06 DIAGNOSIS — Z760 Encounter for issue of repeat prescription: Secondary | ICD-10-CM

## 2021-03-07 ENCOUNTER — Other Ambulatory Visit: Payer: Self-pay | Admitting: Internal Medicine

## 2021-03-07 DIAGNOSIS — Z760 Encounter for issue of repeat prescription: Secondary | ICD-10-CM

## 2021-03-07 MED ORDER — TRAMADOL HCL 50 MG PO TABS: ORAL_TABLET | ORAL | 5 refills | Status: DC

## 2021-03-07 NOTE — Telephone Encounter (Signed)
[id number] Refill:tramadol Last Seen:12-02-20 Last ordered:08-15-20

## 2021-04-04 ENCOUNTER — Other Ambulatory Visit: Payer: Self-pay | Admitting: Internal Medicine

## 2021-04-04 DIAGNOSIS — E1140 Type 2 diabetes mellitus with diabetic neuropathy, unspecified: Secondary | ICD-10-CM

## 2021-04-06 ENCOUNTER — Other Ambulatory Visit: Payer: Self-pay | Admitting: Internal Medicine

## 2021-04-24 ENCOUNTER — Other Ambulatory Visit: Payer: Self-pay | Admitting: Internal Medicine

## 2021-04-24 DIAGNOSIS — F419 Anxiety disorder, unspecified: Secondary | ICD-10-CM

## 2021-04-24 DIAGNOSIS — F32A Depression, unspecified: Secondary | ICD-10-CM

## 2021-04-27 ENCOUNTER — Other Ambulatory Visit: Payer: Self-pay

## 2021-04-27 ENCOUNTER — Ambulatory Visit (INDEPENDENT_AMBULATORY_CARE_PROVIDER_SITE_OTHER): Payer: Medicare Other | Admitting: Gastroenterology

## 2021-04-27 ENCOUNTER — Encounter: Payer: Self-pay | Admitting: Gastroenterology

## 2021-04-27 VITALS — BP 111/64 | HR 102 | Ht 60.0 in | Wt 144.2 lb

## 2021-04-27 DIAGNOSIS — R197 Diarrhea, unspecified: Secondary | ICD-10-CM | POA: Diagnosis not present

## 2021-04-27 DIAGNOSIS — R634 Abnormal weight loss: Secondary | ICD-10-CM

## 2021-04-27 DIAGNOSIS — K30 Functional dyspepsia: Secondary | ICD-10-CM

## 2021-04-27 DIAGNOSIS — K591 Functional diarrhea: Secondary | ICD-10-CM

## 2021-04-27 MED ORDER — CLENPIQ 10-3.5-12 MG-GM -GM/160ML PO SOLN: 320.0000 mL | ORAL | 0 refills | Status: DC

## 2021-04-27 NOTE — H&P (View-Only) (Signed)
Gastroenterology Consultation  Referring Provider:     Crecencio Mc, MD Primary Care Physician:  Crecencio Mc, MD Primary Gastroenterologist:  Dr. Allen Norris     Reason for Consultation:     Diarrhea        HPI:   Kristie Jones is a 65 y.o. y/o female referred for consultation & management of Diarrhea by Dr. Derrel Nip, Aris Everts, MD.  This patient comes in today with a history of diarrhea that she states started abruptly on May 28.  The patient had not had any episodes of diarrhea the significant since then.  The patient reports that the diarrhea is watery and occurs at least 7 times a day and is much as 15 times a day.  She does report that the diarrhea can wake her up from sleep and usually does approximately twice a week.  In addition to the diarrhea the patient reports a weight loss of 12 pounds since May 28.  She is presently being followed by hematology for polycythemia.  The patient does report smoking. The patient does report that she had some dark stools sometime back but denies any bright red blood per rectum.  Her 12 pound weight loss she reports to have been unintentional.  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, BUTTOCKS, ABDOMEN, LIP   Chicken pox    Complication of anesthesia    PT GETS VERY ANXIOUS PRIOR TO ANESTHESIA AND WILL START JERKING MASK OFF   Depression    Diabetes mellitus without complication ([place])    Endometriosis    Headache    Lower extremity edema     Past Surgical History:  Procedure Laterality Date   ABDOMINAL HYSTERECTOMY     CYSTOSCOPY     X3   DIAGNOSTIC LAPAROSCOPY     MOHS SURGERY     PAROTIDECTOMY Left 06/20/2015   Procedure: PAROTIDECTOMY;  Surgeon: Margaretha Sheffield, MD;  Location: [facility] ORS;  Service: ENT;  Laterality: Left;    Prior to Admission medications   Medication Sig Start Date End Date Taking? Authorizing Provider  atorvastatin (LIPITOR) 40 MG tablet TAKE 1  TABLET BY MOUTH ONCE DAILY. 04/07/21  Yes Crecencio Mc, MD  blood glucose meter kit and supplies KIT Dispense based on patient and insurance preference. Use up to four times daily as directed. 11/11/20  Yes Crecencio Mc, MD  diazepam (VALIUM) 10 MG tablet TAKE 1 TABLET AT BEDTIME AS NEEDED FOR ANXIETY 12/17/19  Yes Crecencio Mc, MD  DULoxetine (CYMBALTA) 30 MG capsule TAKE 1 TABLET BY MOUTH ONCE DAILY. (TO BE TAKEN WITH 60MG TABLET) 04/24/21  Yes Crecencio Mc, MD  DULoxetine (CYMBALTA) 60 MG capsule TAKE (1) CAPSULE BY MOUTH ONCE DAILY. 04/24/21  Yes Crecencio Mc, MD  furosemide (LASIX) 20 MG tablet TAKE 1 TABLET BY MOUTH TWICE DAILY 04/24/21  Yes Crecencio Mc, MD  gabapentin (NEURONTIN) 100 MG capsule Take 1 capsule (100 mg total) by mouth 3 (three) times daily. 08/15/20  Yes Crecencio Mc, MD  hyoscyamine (LEVSIN) 0.125 MG tablet Take 1 tablet (0.125 mg total) by mouth every 4 (four) hours as needed. 02/28/21  Yes Crecencio Mc, MD  INVOKANA 300 MG TABS tablet TAKE 1 TABLET BY MOUTH IN THE MORNING. 04/05/21  Yes Crecencio Mc, MD  ondansetron (ZOFRAN-ODT) 4 MG disintegrating tablet TAKE 1 TABLET EVERY 8 HOURS AS  NEEDED FOR NAUSEA & VOMITING 01/25/21  Yes Crecencio Mc, MD  [facility] DELICA LANCETS FINE MISC CHECK BLOOD SUGAR ONCE DAILY 12/23/17  Yes Crecencio Mc, MD  [facility] VERIO test strip CHECK SUGAR ONCE DAILY 06/23/18  Yes Crecencio Mc, MD  spironolactone (ALDACTONE) 100 MG tablet TAKE 1 TABLET BY MOUTH ONCE DAILY. 04/24/21  Yes Crecencio Mc, MD  traMADol (ULTRAM) 50 MG tablet TAKE (2) TABLETS BY MOUTH EVERY SIX HOURS AS NEEDED FOR PAIN. 03/07/21  Yes Crecencio Mc, MD    Family History  Problem Relation Age of Onset   Arthritis Mother    Hypertension Mother    Pancreatic cancer Maternal Aunt    Bone cancer Maternal Aunt    Diabetes Mellitus I Maternal Uncle        CAD    Vasculitis Maternal Aunt        Wegener's      Social History   Tobacco Use   Smoking  status: Every Day    Packs/day: 0.50    Years: 20.00    Pack years: 10.00    Types: Cigarettes   Smokeless tobacco: Never  Vaping Use   Vaping Use: Former  Substance Use Topics   Alcohol use: Yes    Comment: Rarely   Drug use: No    Allergies as of 04/27/2021 - Review Complete 04/27/2021  Allergen Reaction Noted   Ciprofloxacin Rash 09/16/2014   Penicillins Rash 09/16/2014   Sulfamethoxazole-trimethoprim Rash 09/16/2014   Cefdinir Other (See Comments) 09/16/2014   Iodinated diagnostic agents Other (See Comments) 09/16/2014   Methocarbamol  09/16/2014   Levaquin [levofloxacin] Rash 06/09/2015    Review of Systems:    All systems reviewed and negative except where noted in HPI.   Physical Exam:  BP 111/64 (BP Location: Right Arm, Patient Position: Sitting, Cuff Size: Normal)   Pulse (!) 102   Ht 5' (1.524 m)   Wt 144 lb 3.2 oz (65.4 kg)   BMI 28.16 kg/m  No LMP recorded. Patient has had a hysterectomy. General:   Alert,  Well-developed, well-nourished, pleasant and cooperative in NAD Head:  Normocephalic and atraumatic. Eyes:  Sclera clear, no icterus.   Conjunctiva pink. Ears:  Normal auditory acuity. Neck:  Supple; no masses or thyromegaly. Lungs:  Respirations even and unlabored.  Clear throughout to auscultation.   No wheezes, crackles, or rhonchi. No acute distress. Heart:  Regular rate and rhythm; no murmurs, clicks, rubs, or gallops. Abdomen:  Normal bowel sounds.  No bruits.  Soft, non-tender and non-distended without masses, hepatosplenomegaly or hernias noted.  No guarding or rebound tenderness.  Negative Carnett sign.   Rectal:  Deferred.  Pulses:  Normal pulses noted. Extremities:  No clubbing or edema.  No cyanosis. Neurologic:  Alert and oriented x3;  grossly normal neurologically. Skin:  Intact without significant lesions or rashes.  No jaundice. Lymph Nodes:  No significant cervical adenopathy. Psych:  Alert and cooperative. Normal mood and  affect.  Imaging Studies: No results found.  Assessment and Plan:   Kristie Jones is a 65 y.o. y/o female who comes in today with a history of a 12 pound weight loss in addition to diarrhea that started on May 28. In addition to the weight loss the patient reports that she had dark stools when this all started.  She also reports that the diarrhea has woken her up from sleep.  The patient will be set up for an EGD and colonoscopy to look  for a source of her diarrhea and her weight loss.  The patient has been explained the plan and agrees with it.    Lucilla Lame, MD. Marval Regal    Note: This dictation was prepared with Dragon dictation along with smaller phrase technology. Any transcriptional errors that result from this process are unintentional.

## 2021-04-27 NOTE — Progress Notes (Signed)
Gastroenterology Consultation  Referring Provider:     Crecencio Mc, MD Primary Care Physician:  Crecencio Mc, MD Primary Gastroenterologist:  Dr. Allen Norris     Reason for Consultation:     Diarrhea        HPI:   Kristie Jones is a 65 y.o. y/o female referred for consultation & management of Diarrhea by Dr. Derrel Nip, Aris Everts, MD.  This patient comes in today with a history of diarrhea that she states started abruptly on May 28.  The patient had not had any episodes of diarrhea the significant since then.  The patient reports that the diarrhea is watery and occurs at least 7 times a day and is much as 15 times a day.  She does report that the diarrhea can wake her up from sleep and usually does approximately twice a week.  In addition to the diarrhea the patient reports a weight loss of 12 pounds since May 28.  She is presently being followed by hematology for polycythemia.  The patient does report smoking. The patient does report that she had some dark stools sometime back but denies any bright red blood per rectum.  Her 12 pound weight loss she reports to have been unintentional.  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, BUTTOCKS, ABDOMEN, LIP   Chicken pox    Complication of anesthesia    PT GETS VERY ANXIOUS PRIOR TO ANESTHESIA AND WILL START JERKING MASK OFF   Depression    Diabetes mellitus without complication ([place])    Endometriosis    Headache    Lower extremity edema     Past Surgical History:  Procedure Laterality Date   ABDOMINAL HYSTERECTOMY     CYSTOSCOPY     X3   DIAGNOSTIC LAPAROSCOPY     MOHS SURGERY     PAROTIDECTOMY Left 06/20/2015   Procedure: PAROTIDECTOMY;  Surgeon: Margaretha Sheffield, MD;  Location: [facility] ORS;  Service: ENT;  Laterality: Left;    Prior to Admission medications   Medication Sig Start Date End Date Taking? Authorizing Provider  atorvastatin (LIPITOR) 40 MG tablet TAKE 1  TABLET BY MOUTH ONCE DAILY. 04/07/21  Yes Crecencio Mc, MD  blood glucose meter kit and supplies KIT Dispense based on patient and insurance preference. Use up to four times daily as directed. 11/11/20  Yes Crecencio Mc, MD  diazepam (VALIUM) 10 MG tablet TAKE 1 TABLET AT BEDTIME AS NEEDED FOR ANXIETY 12/17/19  Yes Crecencio Mc, MD  DULoxetine (CYMBALTA) 30 MG capsule TAKE 1 TABLET BY MOUTH ONCE DAILY. (TO BE TAKEN WITH 60MG TABLET) 04/24/21  Yes Crecencio Mc, MD  DULoxetine (CYMBALTA) 60 MG capsule TAKE (1) CAPSULE BY MOUTH ONCE DAILY. 04/24/21  Yes Crecencio Mc, MD  furosemide (LASIX) 20 MG tablet TAKE 1 TABLET BY MOUTH TWICE DAILY 04/24/21  Yes Crecencio Mc, MD  gabapentin (NEURONTIN) 100 MG capsule Take 1 capsule (100 mg total) by mouth 3 (three) times daily. 08/15/20  Yes Crecencio Mc, MD  hyoscyamine (LEVSIN) 0.125 MG tablet Take 1 tablet (0.125 mg total) by mouth every 4 (four) hours as needed. 02/28/21  Yes Crecencio Mc, MD  INVOKANA 300 MG TABS tablet TAKE 1 TABLET BY MOUTH IN THE MORNING. 04/05/21  Yes Crecencio Mc, MD  ondansetron (ZOFRAN-ODT) 4 MG disintegrating tablet TAKE 1 TABLET EVERY 8 HOURS AS  NEEDED FOR NAUSEA & VOMITING 01/25/21  Yes Crecencio Mc, MD  [facility] DELICA LANCETS FINE MISC CHECK BLOOD SUGAR ONCE DAILY 12/23/17  Yes Crecencio Mc, MD  [facility] VERIO test strip CHECK SUGAR ONCE DAILY 06/23/18  Yes Crecencio Mc, MD  spironolactone (ALDACTONE) 100 MG tablet TAKE 1 TABLET BY MOUTH ONCE DAILY. 04/24/21  Yes Crecencio Mc, MD  traMADol (ULTRAM) 50 MG tablet TAKE (2) TABLETS BY MOUTH EVERY SIX HOURS AS NEEDED FOR PAIN. 03/07/21  Yes Crecencio Mc, MD    Family History  Problem Relation Age of Onset   Arthritis Mother    Hypertension Mother    Pancreatic cancer Maternal Aunt    Bone cancer Maternal Aunt    Diabetes Mellitus I Maternal Uncle        CAD    Vasculitis Maternal Aunt        Wegener's      Social History   Tobacco Use   Smoking  status: Every Day    Packs/day: 0.50    Years: 20.00    Pack years: 10.00    Types: Cigarettes   Smokeless tobacco: Never  Vaping Use   Vaping Use: Former  Substance Use Topics   Alcohol use: Yes    Comment: Rarely   Drug use: No    Allergies as of 04/27/2021 - Review Complete 04/27/2021  Allergen Reaction Noted   Ciprofloxacin Rash 09/16/2014   Penicillins Rash 09/16/2014   Sulfamethoxazole-trimethoprim Rash 09/16/2014   Cefdinir Other (See Comments) 09/16/2014   Iodinated diagnostic agents Other (See Comments) 09/16/2014   Methocarbamol  09/16/2014   Levaquin [levofloxacin] Rash 06/09/2015    Review of Systems:    All systems reviewed and negative except where noted in HPI.   Physical Exam:  BP 111/64 (BP Location: Right Arm, Patient Position: Sitting, Cuff Size: Normal)   Pulse (!) 102   Ht 5' (1.524 m)   Wt 144 lb 3.2 oz (65.4 kg)   BMI 28.16 kg/m  No LMP recorded. Patient has had a hysterectomy. General:   Alert,  Well-developed, well-nourished, pleasant and cooperative in NAD Head:  Normocephalic and atraumatic. Eyes:  Sclera clear, no icterus.   Conjunctiva pink. Ears:  Normal auditory acuity. Neck:  Supple; no masses or thyromegaly. Lungs:  Respirations even and unlabored.  Clear throughout to auscultation.   No wheezes, crackles, or rhonchi. No acute distress. Heart:  Regular rate and rhythm; no murmurs, clicks, rubs, or gallops. Abdomen:  Normal bowel sounds.  No bruits.  Soft, non-tender and non-distended without masses, hepatosplenomegaly or hernias noted.  No guarding or rebound tenderness.  Negative Carnett sign.   Rectal:  Deferred.  Pulses:  Normal pulses noted. Extremities:  No clubbing or edema.  No cyanosis. Neurologic:  Alert and oriented x3;  grossly normal neurologically. Skin:  Intact without significant lesions or rashes.  No jaundice. Lymph Nodes:  No significant cervical adenopathy. Psych:  Alert and cooperative. Normal mood and  affect.  Imaging Studies: No results found.  Assessment and Plan:   Kristie Jones is a 65 y.o. y/o female who comes in today with a history of a 12 pound weight loss in addition to diarrhea that started on May 28. In addition to the weight loss the patient reports that she had dark stools when this all started.  She also reports that the diarrhea has woken her up from sleep.  The patient will be set up for an EGD and colonoscopy to look  for a source of her diarrhea and her weight loss.  The patient has been explained the plan and agrees with it.    Lucilla Lame, MD. Marval Regal    Note: This dictation was prepared with Dragon dictation along with smaller phrase technology. Any transcriptional errors that result from this process are unintentional.

## 2021-05-25 ENCOUNTER — Ambulatory Visit: Payer: Medicare Other | Admitting: Registered Nurse

## 2021-05-25 ENCOUNTER — Encounter
Admission: RE | Disposition: A | Discharge status: Home / Self Care | Payer: Self-pay | Source: Home / Self Care | Attending: Gastroenterology

## 2021-05-25 ENCOUNTER — Encounter: Payer: Self-pay | Admitting: Gastroenterology

## 2021-05-25 ENCOUNTER — Ambulatory Visit
Admission: RE | Admit: 2021-05-25 | Discharge: 2021-05-25 | Disposition: A | Discharge status: Home / Self Care | Payer: Medicare Other | Attending: Gastroenterology | Admitting: Gastroenterology

## 2021-05-25 DIAGNOSIS — K648 Other hemorrhoids: Secondary | ICD-10-CM | POA: Insufficient documentation

## 2021-05-25 DIAGNOSIS — R634 Abnormal weight loss: Secondary | ICD-10-CM | POA: Diagnosis not present

## 2021-05-25 DIAGNOSIS — E119 Type 2 diabetes mellitus without complications: Secondary | ICD-10-CM | POA: Diagnosis not present

## 2021-05-25 DIAGNOSIS — K635 Polyp of colon: Secondary | ICD-10-CM

## 2021-05-25 DIAGNOSIS — K52832 Lymphocytic colitis: Secondary | ICD-10-CM | POA: Diagnosis not present

## 2021-05-25 DIAGNOSIS — F17210 Nicotine dependence, cigarettes, uncomplicated: Secondary | ICD-10-CM | POA: Diagnosis not present

## 2021-05-25 DIAGNOSIS — K591 Functional diarrhea: Secondary | ICD-10-CM

## 2021-05-25 DIAGNOSIS — Z888 Allergy status to other drugs, medicaments and biological substances status: Secondary | ICD-10-CM | POA: Insufficient documentation

## 2021-05-25 DIAGNOSIS — Z6828 Body mass index (BMI) 28.0-28.9, adult: Secondary | ICD-10-CM | POA: Diagnosis not present

## 2021-05-25 DIAGNOSIS — K529 Noninfective gastroenteritis and colitis, unspecified: Secondary | ICD-10-CM | POA: Diagnosis not present

## 2021-05-25 DIAGNOSIS — Z79899 Other long term (current) drug therapy: Secondary | ICD-10-CM | POA: Diagnosis not present

## 2021-05-25 DIAGNOSIS — Z7984 Long term (current) use of oral hypoglycemic drugs: Secondary | ICD-10-CM | POA: Diagnosis not present

## 2021-05-25 DIAGNOSIS — Z881 Allergy status to other antibiotic agents status: Secondary | ICD-10-CM | POA: Diagnosis not present

## 2021-05-25 DIAGNOSIS — Z882 Allergy status to sulfonamides status: Secondary | ICD-10-CM | POA: Diagnosis not present

## 2021-05-25 DIAGNOSIS — K30 Functional dyspepsia: Secondary | ICD-10-CM

## 2021-05-25 DIAGNOSIS — D125 Benign neoplasm of sigmoid colon: Secondary | ICD-10-CM | POA: Insufficient documentation

## 2021-05-25 HISTORY — PX: COLONOSCOPY WITH PROPOFOL: SHX5780

## 2021-05-25 HISTORY — PX: ESOPHAGOGASTRODUODENOSCOPY (EGD) WITH PROPOFOL: SHX5813

## 2021-05-25 LAB — GLUCOSE, CAPILLARY: Glucose-Capillary: 115 mg/dL — ABNORMAL HIGH (ref 70–99)

## 2021-05-25 LAB — HM COLONOSCOPY

## 2021-05-25 SURGERY — COLONOSCOPY WITH PROPOFOL
Anesthesia: General

## 2021-05-25 MED ORDER — PHENYLEPHRINE HCL (PRESSORS) 10 MG/ML IV SOLN
INTRAVENOUS | Status: DC | PRN
  Administered 2021-05-25: 160 ug via INTRAVENOUS
  Administered 2021-05-25: 120 ug via INTRAVENOUS
  Administered 2021-05-25: 80 ug via INTRAVENOUS

## 2021-05-25 MED ORDER — PROPOFOL 500 MG/50ML IV EMUL
INTRAVENOUS | Status: DC | PRN
  Administered 2021-05-25: 150 ug/kg/min via INTRAVENOUS

## 2021-05-25 MED ORDER — PROPOFOL 10 MG/ML IV BOLUS
INTRAVENOUS | Status: DC | PRN
  Administered 2021-05-25: 60 mg via INTRAVENOUS

## 2021-05-25 MED ORDER — LIDOCAINE HCL (CARDIAC) PF 100 MG/5ML IV SOSY
PREFILLED_SYRINGE | INTRAVENOUS | Status: DC | PRN
  Administered 2021-05-25: 100 mg via INTRAVENOUS

## 2021-05-25 MED ORDER — SODIUM CHLORIDE 0.9 % IV SOLN
INTRAVENOUS | Status: DC
  Administered 2021-05-25: 1000 mL via INTRAVENOUS

## 2021-05-25 MED ORDER — DEXMEDETOMIDINE HCL IN NACL 200 MCG/50ML IV SOLN
INTRAVENOUS | Status: DC | PRN
  Administered 2021-05-25: 20 ug via INTRAVENOUS

## 2021-05-25 NOTE — Op Note (Signed)
[facility] Gastroenterology Patient Name: Kristie Jones Procedure Date: 05/25/2021 11:01 AM MRN: [id number] Account #: [id number] Date of Birth: Aug 17, 1955 Admit Type: Outpatient Age: 65 Room: [facility] ENDO ROOM 4 Gender: Female Note Status: Finalized Instrument Name: Jasper Riling [id number] Procedure:             Colonoscopy Indications:           Chronic diarrhea, Weight loss Providers:             Lucilla Lame MD, MD Referring MD:          Deborra Medina, MD (Referring MD) Medicines:             Propofol per Anesthesia Complications:         No immediate complications. Procedure:             Pre-Anesthesia Assessment:                        - Prior to the procedure, a History and Physical was                         performed, and patient medications and allergies were                         reviewed. The patient's tolerance of previous                         anesthesia was also reviewed. The risks and benefits                         of the procedure and the sedation options and risks                         were discussed with the patient. All questions were                         answered, and informed consent was obtained. Prior                         Anticoagulants: The patient has taken no previous                         anticoagulant or antiplatelet agents. ASA Grade                         Assessment: II - A patient with mild systemic disease.                         After reviewing the risks and benefits, the patient                         was deemed in satisfactory condition to undergo the                         procedure.                        After obtaining informed consent, the colonoscope was  passed under direct vision. Throughout the procedure,                         the patient's blood pressure, pulse, and oxygen                         saturations were monitored continuously. The                         Colonoscope was  introduced through the anus and                         advanced to the the terminal ileum. The colonoscopy                         was performed without difficulty. The patient                         tolerated the procedure well. The quality of the bowel                         preparation was good. Findings:      The perianal and digital rectal examinations were normal.      The terminal ileum appeared normal. Biopsies were taken with a cold       forceps for histology.      A 6 mm polyp was found in the sigmoid colon. The polyp was sessile. The       polyp was removed with a cold snare. Resection and retrieval were       complete.      Non-bleeding internal hemorrhoids were found during retroflexion. The       hemorrhoids were Grade II (internal hemorrhoids that prolapse but reduce       spontaneously).      Random biopsies were obtained with cold forceps for histology randomly       in the entire colon. Impression:            - The examined portion of the ileum was normal.                         Biopsied.                        - One 6 mm polyp in the sigmoid colon, removed with a                         cold snare. Resected and retrieved.                        - Non-bleeding internal hemorrhoids.                        - Random biopsies were obtained in the entire colon. Recommendation:        - Discharge patient to home.                        - Resume previous diet.                        - Continue present  medications.                        - Await pathology results.                        - If the pathology report reveals adenomatous tissue,                         then repeat the colonoscopy for surveillance in 7                         years otherwise 10 years. Procedure Code(s):     --- Professional ---                        [phone], Colonoscopy, flexible; with removal of                         tumor(s), polyp(s), or other lesion(s) by snare                          technique                        45380, 48, Colonoscopy, flexible; with biopsy, single                         or multiple Diagnosis Code(s):     --- Professional ---                        K52.9, Noninfective gastroenteritis and colitis,                         unspecified                        R63.4, Abnormal weight loss                        K63.5, Polyp of colon CPT copyright 2019 [employer]. All rights reserved. The codes documented in this report are preliminary and upon coder review may  be revised to meet current compliance requirements. Lucilla Lame MD, MD 05/25/2021 11:28:47 AM This report has been signed electronically. Number of Addenda: 0 Note Initiated On: 05/25/2021 11:01 AM Scope Withdrawal Time: 0 hours 6 minutes 38 seconds  Total Procedure Duration: 0 hours 12 minutes 19 seconds  Estimated Blood Loss:  Estimated blood loss: none.      [place]

## 2021-05-25 NOTE — Interval H&P Note (Signed)
Lucilla Lame, MD [address] [place], [place] [address] Phone:[phone] Fax : [phone]  Primary Care Physician:  Crecencio Mc, MD Primary Gastroenterologist:  Dr. Allen Norris  Pre-Procedure History & Physical: HPI:  Kristie Jones is a 65 y.o. female is here for an endoscopy and colonoscopy.   Past Medical History:  Diagnosis Date   Allergy    Arthritis    Cancer ([place])    MULTIPLE SQUAMOUS CELL-WRIST, BUTTOCKS, ABDOMEN, LIP   Chicken pox    Complication of anesthesia    PT GETS VERY ANXIOUS PRIOR TO ANESTHESIA AND WILL START JERKING MASK OFF   Depression    Diabetes mellitus without complication ([place])    Endometriosis    Headache    Lower extremity edema     Past Surgical History:  Procedure Laterality Date   ABDOMINAL HYSTERECTOMY     CYSTOSCOPY     X3   DIAGNOSTIC LAPAROSCOPY     MOHS SURGERY     PAROTIDECTOMY Left 06/20/2015   Procedure: PAROTIDECTOMY;  Surgeon: Margaretha Sheffield, MD;  Location: [facility] ORS;  Service: ENT;  Laterality: Left;    Prior to Admission medications   Medication Sig Start Date End Date Taking? Authorizing Provider  atorvastatin (LIPITOR) 40 MG tablet TAKE 1 TABLET BY MOUTH ONCE DAILY. 04/07/21  Yes Crecencio Mc, MD  blood glucose meter kit and supplies KIT Dispense based on patient and insurance preference. Use up to four times daily as directed. 11/11/20  Yes Crecencio Mc, MD  diazepam (VALIUM) 10 MG tablet TAKE 1 TABLET AT BEDTIME AS NEEDED FOR ANXIETY 12/17/19  Yes Crecencio Mc, MD  DULoxetine (CYMBALTA) 30 MG capsule TAKE 1 TABLET BY MOUTH ONCE DAILY. (TO BE TAKEN WITH 60MG TABLET) 04/24/21  Yes Crecencio Mc, MD  DULoxetine (CYMBALTA) 60 MG capsule TAKE (1) CAPSULE BY MOUTH ONCE DAILY. 04/24/21  Yes Crecencio Mc, MD  furosemide (LASIX) 20 MG tablet TAKE 1 TABLET BY MOUTH TWICE DAILY 04/24/21  Yes Crecencio Mc, MD  gabapentin (NEURONTIN) 100 MG capsule Take 1 capsule (100 mg total) by mouth 3 (three) times daily.  08/15/20  Yes Crecencio Mc, MD  INVOKANA 300 MG TABS tablet TAKE 1 TABLET BY MOUTH IN THE MORNING. 04/05/21  Yes Crecencio Mc, MD  [facility] DELICA LANCETS FINE MISC CHECK BLOOD SUGAR ONCE DAILY 12/23/17  Yes Crecencio Mc, MD  [facility] VERIO test strip CHECK SUGAR ONCE DAILY 06/23/18  Yes Crecencio Mc, MD  Sod Picosulfate-Mag Ox-Cit Acd (CLENPIQ) 10-3.5-12 MG-GM -GM/160ML SOLN Take 320 mLs by mouth as directed. 04/27/21  Yes Lucilla Lame, MD  spironolactone (ALDACTONE) 100 MG tablet TAKE 1 TABLET BY MOUTH ONCE DAILY. 04/24/21  Yes Crecencio Mc, MD  traMADol (ULTRAM) 50 MG tablet TAKE (2) TABLETS BY MOUTH EVERY SIX HOURS AS NEEDED FOR PAIN. 03/07/21  Yes Crecencio Mc, MD  hyoscyamine (LEVSIN) 0.125 MG tablet Take 1 tablet (0.125 mg total) by mouth every 4 (four) hours as needed. 02/28/21   Crecencio Mc, MD  ondansetron (ZOFRAN-ODT) 4 MG disintegrating tablet TAKE 1 TABLET EVERY 8 HOURS AS NEEDED FOR NAUSEA & VOMITING 01/25/21   Crecencio Mc, MD    Allergies as of 04/27/2021 - Review Complete 04/27/2021  Allergen Reaction Noted   Ciprofloxacin Rash 09/16/2014   Penicillins Rash 09/16/2014   Sulfamethoxazole-trimethoprim Rash 09/16/2014   Cefdinir Other (See Comments) 09/16/2014   Iodinated diagnostic agents Other (See Comments) 09/16/2014  Methocarbamol  09/16/2014   Levaquin [levofloxacin] Rash 06/09/2015    Family History  Problem Relation Age of Onset   Arthritis Mother    Hypertension Mother    Pancreatic cancer Maternal Aunt    Bone cancer Maternal Aunt    Diabetes Mellitus I Maternal Uncle        CAD    Vasculitis Maternal Aunt        Wegener's     Social History   Socioeconomic History   Marital status: Married    Spouse name: Not on file   Number of children: Not on file   Years of education: Not on file   Highest education level: Not on file  Occupational History   Occupation: unemployed    Comment: lost job 04/03/2018  Tobacco Use   Smoking status:  Every Day    Packs/day: 0.50    Years: 20.00    Pack years: 10.00    Types: Cigarettes   Smokeless tobacco: Never  Vaping Use   Vaping Use: Former  Substance and Sexual Activity   Alcohol use: Yes    Comment: Rarely   Drug use: No   Sexual activity: Yes  Other Topics Concern   Not on file  Social History Narrative   Not on file   Social Determinants of Health   Financial Resource Strain: High Risk   Difficulty of Paying Living Expenses: Very hard  Food Insecurity: Not on file  Transportation Needs: Not on file  Physical Activity: Not on file  Stress: Stress Concern Present   Feeling of Stress : Rather much  Social Connections: Not on file  Intimate Partner Violence: Not on file    Review of Systems: See HPI, otherwise negative ROS  Physical Exam: BP 139/80   Pulse 90   Temp (!) 96.3 F (35.7 C) (Temporal)   Resp 18   Ht 5' (1.524 m)   Wt 64.5 kg   SpO2 98%   BMI 27.79 kg/m  General:   Alert,  pleasant and cooperative in NAD Head:  Normocephalic and atraumatic. Neck:  Supple; no masses or thyromegaly. Lungs:  Clear throughout to auscultation.    Heart:  Regular rate and rhythm. Abdomen:  Soft, nontender and nondistended. Normal bowel sounds, without guarding, and without rebound.   Neurologic:  Alert and  oriented x4;  grossly normal neurologically.  Impression/Plan: Kristie Jones is here for an endoscopy and colonoscopy to be performed for weight loss and diarrhea  Risks, benefits, limitations, and alternatives regarding  endoscopy and colonoscopy have been reviewed with the patient.  Questions have been answered.  All parties agreeable.   Lucilla Lame, MD  05/25/2021, 10:59 AM

## 2021-05-25 NOTE — Transfer of Care (Signed)
Immediate Anesthesia Transfer of Care Note  Patient: Kristie Jones  Procedure(s) Performed: COLONOSCOPY WITH PROPOFOL ESOPHAGOGASTRODUODENOSCOPY (EGD) WITH PROPOFOL  Patient Location: Endoscopy Unit  Anesthesia Type:General  Level of Consciousness: drowsy  Airway & Oxygen Therapy: Patient Spontanous Breathing  Post-op Assessment: Report given to RN and Post -op Vital signs reviewed and stable  Post vital signs: Reviewed and stable  Last Vitals:  Vitals Value Taken Time  BP 97/81 05/25/21 1133  Temp    Pulse 72 05/25/21 1133  Resp 14 05/25/21 1133  SpO2 97 % 05/25/21 1133  Vitals shown include unvalidated device data.  Last Pain:  Vitals:   05/25/21 0958  TempSrc: Temporal  PainSc: 0-No pain         Complications: No notable events documented.

## 2021-05-25 NOTE — Anesthesia Preprocedure Evaluation (Addendum)
Anesthesia Evaluation  Patient identified by MRN, date of birth, ID band Patient awake    Reviewed: Allergy & Precautions, NPO status , Patient's Chart, lab work & pertinent test results  History of Anesthesia Complications Negative for: history of anesthetic complications  Airway Mallampati: II   Neck ROM: Full    Dental  (+) Poor Dentition   Pulmonary Current Smoker (less than 1/2 ppd)Patient did not abstain from smoking.,    Pulmonary exam normal breath sounds clear to auscultation       Cardiovascular Normal cardiovascular exam Rhythm:Regular Rate:Normal     Neuro/Psych  Headaches, PSYCHIATRIC DISORDERS Anxiety Depression    GI/Hepatic negative GI ROS,   Endo/Other  diabetes, Type 2  Renal/GU negative Renal ROS     Musculoskeletal  (+) Arthritis ,   Abdominal   Peds  Hematology  (+) Blood dyscrasia (polycythemia), , Skin CA   Anesthesia Other Findings   Reproductive/Obstetrics Endometriosis                             Anesthesia Physical Anesthesia Plan  ASA: 2  Anesthesia Plan: General   Post-op Pain Management:    Induction: Intravenous  PONV Risk Score and Plan: 2 and Propofol infusion, TIVA and Treatment may vary due to age or medical condition  Airway Management Planned: Natural Airway  Additional Equipment:   Intra-op Plan:   Post-operative Plan:   Informed Consent: I have reviewed the patients History and Physical, chart, labs and discussed the procedure including the risks, benefits and alternatives for the proposed anesthesia with the patient or authorized representative who has indicated his/her understanding and acceptance.       Plan Discussed with: CRNA  Anesthesia Plan Comments:         Anesthesia Quick Evaluation

## 2021-05-25 NOTE — Op Note (Signed)
[facility] Gastroenterology Patient Name: Kristie Jones Procedure Date: 05/25/2021 11:02 AM MRN: [id number] Account #: [id number] Date of Birth: [birth year] Admit Type: Outpatient Age: 65 Room: [facility] ENDO ROOM 4 Gender: Female Note Status: Finalized Instrument Name: Upper Endoscope [id number] Procedure:             Upper GI endoscopy Indications:           Diarrhea, Weight loss Providers:             Lucilla Lame MD, MD Referring MD:          Deborra Medina, MD (Referring MD) Medicines:             Propofol per Anesthesia Complications:         No immediate complications. Procedure:             Pre-Anesthesia Assessment:                        - Prior to the procedure, a History and Physical was                         performed, and patient medications and allergies were                         reviewed. The patient's tolerance of previous                         anesthesia was also reviewed. The risks and benefits                         of the procedure and the sedation options and risks                         were discussed with the patient. All questions were                         answered, and informed consent was obtained. Prior                         Anticoagulants: The patient has taken no previous                         anticoagulant or antiplatelet agents. ASA Grade                         Assessment: II - A patient with mild systemic disease.                         After reviewing the risks and benefits, the patient                         was deemed in satisfactory condition to undergo the                         procedure.                        After obtaining informed consent, the endoscope was  passed under direct vision. Throughout the procedure,                         the patient's blood pressure, pulse, and oxygen                         saturations were monitored continuously. The Endoscope                         was  introduced through the mouth, and advanced to the                         second part of duodenum. The upper GI endoscopy was                         accomplished without difficulty. The patient tolerated                         the procedure well. Findings:      The examined esophagus was normal.      The entire examined stomach was normal.      The examined duodenum was normal. Biopsies were taken with a cold       forceps for histology. Impression:            - Normal esophagus.                        - Normal stomach.                        - Normal examined duodenum. Biopsied. Recommendation:        - Discharge patient to home.                        - Resume previous diet.                        - Continue present medications.                        - Perform a colonoscopy today. Procedure Code(s):     --- Professional ---                        [phone], Esophagogastroduodenoscopy, flexible,                         transoral; with biopsy, single or multiple Diagnosis Code(s):     --- Professional ---                        R63.4, Abnormal weight loss                        R19.7, Diarrhea, unspecified CPT copyright 2019 American Medical Association. All rights reserved. The codes documented in this report are preliminary and upon coder review may  be revised to meet current compliance requirements. Lucilla Lame MD, MD 05/25/2021 11:12:01 AM This report has been signed electronically. Number of Addenda: 0 Note Initiated On: 05/25/2021 11:02 AM Estimated Blood Loss:  Estimated blood loss: none.      [facility]

## 2021-05-25 NOTE — Anesthesia Postprocedure Evaluation (Signed)
Anesthesia Post Note  Patient: Kristie Jones  Procedure(s) Performed: COLONOSCOPY WITH PROPOFOL ESOPHAGOGASTRODUODENOSCOPY (EGD) WITH PROPOFOL  Patient location during evaluation: PACU Anesthesia Type: General Level of consciousness: awake and alert, oriented and patient cooperative Pain management: pain level controlled Vital Signs Assessment: post-procedure vital signs reviewed and stable Respiratory status: spontaneous breathing, nonlabored ventilation and respiratory function stable Cardiovascular status: blood pressure returned to baseline and stable Postop Assessment: adequate PO intake Anesthetic complications: no   No notable events documented.   Last Vitals:  Vitals:   05/25/21 1153 05/25/21 1154  BP: 109/65   Pulse:  66  Resp:  15  Temp:    SpO2:  99%    Last Pain:  Vitals:   05/25/21 1154  TempSrc:   PainSc: 0-No pain                 Darrin Nipper

## 2021-05-26 ENCOUNTER — Encounter: Payer: Self-pay | Admitting: Gastroenterology

## 2021-05-26 LAB — SURGICAL PATHOLOGY

## 2021-05-29 ENCOUNTER — Inpatient Hospital Stay: Payer: Medicare Other | Admitting: Nurse Practitioner

## 2021-05-29 ENCOUNTER — Inpatient Hospital Stay: Payer: Medicare Other

## 2021-06-01 ENCOUNTER — Telehealth: Payer: Self-pay

## 2021-06-01 NOTE — Telephone Encounter (Signed)
Pt returned my call and has been scheduled for a follow up appt to discuss results on 06/08/21.

## 2021-06-01 NOTE — Telephone Encounter (Signed)
-----   Message from Lucilla Lame, MD sent at 05/31/2021  6:55 AM EDT ----- Please have the patient come in for a follow up.

## 2021-06-01 NOTE — Telephone Encounter (Signed)
LVM for pt to return my call.

## 2021-06-02 ENCOUNTER — Other Ambulatory Visit: Payer: Self-pay | Admitting: Internal Medicine

## 2021-06-02 DIAGNOSIS — Z20822 Contact with and (suspected) exposure to covid-19: Secondary | ICD-10-CM

## 2021-06-05 ENCOUNTER — Other Ambulatory Visit: Payer: Self-pay

## 2021-06-05 ENCOUNTER — Inpatient Hospital Stay: Payer: Medicare Other | Attending: Oncology

## 2021-06-05 ENCOUNTER — Inpatient Hospital Stay (HOSPITAL_BASED_OUTPATIENT_CLINIC_OR_DEPARTMENT_OTHER): Payer: Medicare Other | Admitting: Nurse Practitioner

## 2021-06-05 VITALS — BP 126/78 | HR 108 | Temp 96.5°F | Wt 144.0 lb

## 2021-06-05 DIAGNOSIS — D751 Secondary polycythemia: Secondary | ICD-10-CM

## 2021-06-05 LAB — CBC WITH DIFFERENTIAL/PLATELET
Abs Immature Granulocytes: 0.03 10*3/uL (ref 0.00–0.07)
Basophils Absolute: 0.1 10*3/uL (ref 0.0–0.1)
Basophils Relative: 1 %
Eosinophils Absolute: 0.1 10*3/uL (ref 0.0–0.5)
Eosinophils Relative: 1 %
HCT: 50.1 % — ABNORMAL HIGH (ref 36.0–46.0)
Hemoglobin: 17.1 g/dL — ABNORMAL HIGH (ref 12.0–15.0)
Immature Granulocytes: 0 %
Lymphocytes Relative: 22 %
Lymphs Abs: 2.4 10*3/uL (ref 0.7–4.0)
MCH: 31.6 pg (ref 26.0–34.0)
MCHC: 34.1 g/dL (ref 30.0–36.0)
MCV: 92.6 fL (ref 80.0–100.0)
Monocytes Absolute: 0.6 10*3/uL (ref 0.1–1.0)
Monocytes Relative: 6 %
Neutro Abs: 7.6 10*3/uL (ref 1.7–7.7)
Neutrophils Relative %: 70 %
Platelets: 229 10*3/uL (ref 150–400)
RBC: 5.41 MIL/uL — ABNORMAL HIGH (ref 3.87–5.11)
RDW: 13.4 % (ref 11.5–15.5)
WBC: 10.7 10*3/uL — ABNORMAL HIGH (ref 4.0–10.5)
nRBC: 0 % (ref 0.0–0.2)
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
## 2021-06-05 NOTE — Progress Notes (Signed)
Follow-up for polycythemia. Reports that she was diagnosed with lymphocytic colitis recently. She is following up with GI. Reports recent weight loss along with lots of diarrhea.

## 2021-06-08 ENCOUNTER — Other Ambulatory Visit: Payer: Self-pay

## 2021-06-08 ENCOUNTER — Encounter: Payer: Self-pay | Admitting: Gastroenterology

## 2021-06-08 ENCOUNTER — Ambulatory Visit (INDEPENDENT_AMBULATORY_CARE_PROVIDER_SITE_OTHER): Payer: Medicare Other | Admitting: Gastroenterology

## 2021-06-08 VITALS — BP 127/85 | HR 112 | Temp 97.4°F | Ht 60.0 in | Wt 145.0 lb

## 2021-06-08 DIAGNOSIS — K52832 Lymphocytic colitis: Secondary | ICD-10-CM

## 2021-06-08 MED ORDER — BUDESONIDE 3 MG PO CPEP
9.0000 mg | ORAL_CAPSULE | ORAL | 6 refills | Status: DC
Start: 2021-06-08 — End: 2022-01-31

## 2021-06-08 NOTE — Progress Notes (Signed)
Primary Care Physician: Crecencio Mc, MD  Primary Gastroenterologist:  Dr. Lucilla Lame  Chief Complaint  Patient presents with   Follow up procedure results     HPI: Kristie Jones is a 65 y.o. female here after having a colonoscopy done for diarrhea.  The patient had a polyp removed that was a serrated polyp and also had random biopsies done due to the diarrhea.  The biopsies were found to have lymphocytic colitis and that is why the patient is following up today to discuss the findings on pathology.  The patient reports that she is had irritable bowel syndrome with diarrhea in the past but this has been progressively worse and different than her previous irritable bowel syndrome.  Past Medical History:  Diagnosis Date   Allergy    Arthritis    Cancer ([place])    MULTIPLE SQUAMOUS CELL-WRIST, BUTTOCKS, ABDOMEN, LIP   Chicken pox    Complication of anesthesia    PT GETS VERY ANXIOUS PRIOR TO ANESTHESIA AND WILL START JERKING MASK OFF   Depression    Diabetes mellitus without complication (HCC)    Endometriosis    Headache    Lower extremity edema     Current Outpatient Medications  Medication Sig Dispense Refill   atorvastatin (LIPITOR) 40 MG tablet TAKE 1 TABLET BY MOUTH ONCE DAILY. 90 tablet 0   blood glucose meter kit and supplies KIT Dispense based on patient and insurance preference. Use up to four times daily as directed. 1 each 3   diazepam (VALIUM) 10 MG tablet TAKE 1 TABLET AT BEDTIME AS NEEDED FOR ANXIETY 30 tablet 5   DULoxetine (CYMBALTA) 30 MG capsule TAKE 1 TABLET BY MOUTH ONCE DAILY. (TO BE TAKEN WITH 60MG TABLET) 90 capsule 0   DULoxetine (CYMBALTA) 60 MG capsule TAKE (1) CAPSULE BY MOUTH ONCE DAILY. 90 capsule 0   furosemide (LASIX) 20 MG tablet TAKE 1 TABLET BY MOUTH TWICE DAILY 180 tablet 0   gabapentin (NEURONTIN) 100 MG capsule Take 1 capsule (100 mg total) by mouth 3 (three) times daily. 180 capsule 1   hyoscyamine (LEVSIN) 0.125 MG tablet Take 1  tablet (0.125 mg total) by mouth every 4 (four) hours as needed. 60 tablet 2   INVOKANA 300 MG TABS tablet TAKE 1 TABLET BY MOUTH IN THE MORNING. 90 tablet 11   ondansetron (ZOFRAN-ODT) 4 MG disintegrating tablet DISSOLVE 1 TABLET BY MOUTH EVERY 8 HOURS AS NEEDED FOR NAUSEA & VOMITING. *MAY TAKE 2 AT A TIME IF NEEDED* 20 tablet 0   ONETOUCH DELICA LANCETS FINE MISC CHECK BLOOD SUGAR ONCE DAILY 100 each 6   ONETOUCH VERIO test strip CHECK SUGAR ONCE DAILY 100 each 3   spironolactone (ALDACTONE) 100 MG tablet TAKE 1 TABLET BY MOUTH ONCE DAILY. 90 tablet 0   traMADol (ULTRAM) 50 MG tablet TAKE (2) TABLETS BY MOUTH EVERY SIX HOURS AS NEEDED FOR PAIN. 240 tablet 5   No current facility-administered medications for this visit.    Allergies as of 06/08/2021 - Review Complete 06/08/2021  Allergen Reaction Noted   Ciprofloxacin Rash 09/16/2014   Penicillins Rash 09/16/2014   Sulfamethoxazole-trimethoprim Rash 09/16/2014   Cefdinir Other (See Comments) 09/16/2014   Iodinated diagnostic agents Other (See Comments) 09/16/2014   Methocarbamol  09/16/2014   Levaquin [levofloxacin] Rash 06/09/2015    ROS:  General: Negative for anorexia, weight loss, fever, chills, fatigue, weakness. ENT: Negative for hoarseness, difficulty swallowing , nasal congestion. CV: Negative for chest pain, angina, palpitations,  dyspnea on exertion, peripheral edema.  Respiratory: Negative for dyspnea at rest, dyspnea on exertion, cough, sputum, wheezing.  GI: See history of present illness. GU:  Negative for dysuria, hematuria, urinary incontinence, urinary frequency, nocturnal urination.  Endo: Negative for unusual weight change.    Physical Examination:   BP 127/85 (BP Location: Left Arm, Patient Position: Sitting, Cuff Size: Large)   Pulse (!) 112   Temp (!) 97.4 F (36.3 C) (Temporal)   Ht 5' (1.524 m)   Wt 145 lb (65.8 kg)   BMI 28.32 kg/m   General: Well-nourished, well-developed in no acute distress.   Eyes: No icterus. Conjunctivae pink. Skin: Warm and dry, no jaundice.   Psych: Alert and cooperative, normal mood and affect.  Labs:    Imaging Studies: No results found.  Assessment and Plan:   Kristie Jones is a 65 y.o. y/o female who comes in today for follow-up after having a colonoscopy.  The patient will need a repeat colonoscopy due to her polyp being a sessile serrated polyp.  That should be repeated in 5 years.  The patient will also be started on budesonide 9 mg/day for her lymphocytic colitis. The patient has been explained the plan and agrees with it.     Lucilla Lame, MD. Marval Regal    Note: This dictation was prepared with Dragon dictation along with smaller phrase technology. Any transcriptional errors that result from this process are unintentional.

## 2021-07-07 ENCOUNTER — Telehealth: Payer: Self-pay | Admitting: Internal Medicine

## 2021-07-07 NOTE — Telephone Encounter (Signed)
See previous message . Patient called back requesting to speak to Catie.

## 2021-07-07 NOTE — Telephone Encounter (Signed)
Pt called in regards to Ozempic approval. Pt did not state what exact questions she had about it.   Pt also wants to update [place] on her recent stomach pain. Pt states she finally has a diagnosis and is finally feeling a lot better. Pt asks if she can be called on her home phone.

## 2021-07-09 ENCOUNTER — Other Ambulatory Visit: Payer: Self-pay | Admitting: Internal Medicine

## 2021-07-10 NOTE — Telephone Encounter (Signed)
RX Refill: valium Last Seen: 12-02-20 Last Ordered: 12-17-19 Next Appt: NA

## 2021-07-19 ENCOUNTER — Telehealth: Payer: Medicare Other

## 2021-07-23 ENCOUNTER — Other Ambulatory Visit: Payer: Self-pay | Admitting: Internal Medicine

## 2021-07-23 DIAGNOSIS — F32A Depression, unspecified: Secondary | ICD-10-CM

## 2021-07-26 ENCOUNTER — Ambulatory Visit (INDEPENDENT_AMBULATORY_CARE_PROVIDER_SITE_OTHER): Payer: Medicare Other | Admitting: Pharmacist

## 2021-07-26 ENCOUNTER — Telehealth: Payer: Self-pay | Admitting: Pharmacist

## 2021-07-26 DIAGNOSIS — E1140 Type 2 diabetes mellitus with diabetic neuropathy, unspecified: Secondary | ICD-10-CM

## 2021-07-26 DIAGNOSIS — E785 Hyperlipidemia, unspecified: Secondary | ICD-10-CM

## 2021-07-26 MED ORDER — BLOOD GLUCOSE MONITOR KIT: PACK | 11 refills | Status: DC

## 2021-07-26 MED ORDER — BLOOD GLUCOSE MONITOR KIT: PACK | 11 refills | Status: AC

## 2021-07-26 NOTE — Telephone Encounter (Signed)
°  Chronic Care Management   Note  07/26/2021 Name: Kristie Jones MRN: [id number] DOB: 07-13-1956   Attempted to contact patient for scheduled appointment for medication management support. Spoke with her at scheduled appointment time, she noted that she was at [employer] and couldn't talk. Offered to call back at 3:30.   Called at 3:30. Left HIPAA compliant message for patient to return my call at their convenience.    Plan: - If I do not hear back from the patient by end of business today, will collaborate with Care Guide to outreach to schedule follow up with me   [name], PharmD, [place], [name] Pharmacist [employer] at [employer] [phone]

## 2021-07-26 NOTE — Chronic Care Management (AMB) (Signed)
Chronic Care Management CCM Pharmacy Note  07/26/2021 Name:  Kristie Jones MRN:  [id number] DOB:  Dec 17, 1955  Summary: - Reports she is feeling much better now that colitis is treated  Recommendations/Changes made from today's visit: - Scheduled for follow up and lab work with PCP. Will discuss GLP1 therapy after that  Subjective: Kristie Jones is an 65 y.o. year old female who is a primary patient of [name], Aris Everts, MD.  The CCM team was consulted for assistance with disease management and care coordination needs.    Engaged with patient by telephone for follow up visit for pharmacy case management and/or care coordination services.   Objective:  Medications Reviewed Today     Reviewed by De Hollingshead, RPH-CPP (Pharmacist) on 07/26/21 at 1643  Med List Status: <None>   Medication Order Taking? Sig Documenting Provider Last Dose Status Informant  atorvastatin (LIPITOR) 40 MG tablet [id number] Yes TAKE 1 TABLET BY MOUTH ONCE DAILY. Crecencio Mc, MD Taking Active   blood glucose meter kit and supplies KIT [id number]  Dispense based on patient and insurance preference. Use up to four times daily as directed. Crecencio Mc, MD  Active   budesonide (ENTOCORT EC) 3 MG 24 hr capsule [id number] Yes Take 3 capsules (9 mg total) by mouth every morning. Lucilla Lame, MD Taking Active   diazepam (VALIUM) 10 MG tablet [id number] Yes TAKE 1 TABLET AT BEDTIME AS NEEDED FOR ANXIETY Crecencio Mc, MD Taking Active   DULoxetine (CYMBALTA) 30 MG capsule [id number] Yes TAKE 1 TABLET BY MOUTH ONCE DAILY. (TO BE TAKEN WITH 60MG TABLET) Crecencio Mc, MD Taking Active   DULoxetine (CYMBALTA) 60 MG capsule [id number] Yes TAKE (1) CAPSULE BY MOUTH ONCE DAILY. Crecencio Mc, MD Taking Active   furosemide (LASIX) 20 MG tablet [id number] Yes TAKE 1 TABLET BY MOUTH TWICE DAILY Crecencio Mc, MD Taking Active            Med Note Kelby Aline Jul 26, 2021  4:41 PM) Taking 40 mg QAM   INVOKANA 300 MG TABS tablet [id number] Yes TAKE 1 TABLET BY MOUTH IN THE MORNING. Crecencio Mc, MD Taking Active   ondansetron (ZOFRAN-ODT) 4 MG disintegrating tablet [id number] Yes DISSOLVE 1 TABLET BY MOUTH EVERY 8 HOURS AS NEEDED FOR NAUSEA & VOMITING. *MAY TAKE 2 AT A TIME IF NEEDED* Crecencio Mc, MD Taking Active   spironolactone (ALDACTONE) 100 MG tablet [id number] Yes TAKE 1 TABLET BY MOUTH ONCE DAILY. Crecencio Mc, MD Taking Active   traMADol Veatrice Bourbon) 50 MG tablet [id number] Yes TAKE (2) TABLETS BY MOUTH EVERY SIX HOURS AS NEEDED FOR PAIN. Crecencio Mc, MD Taking Active             Pertinent Labs:   Lab Results  Component Value Date   HGBA1C 6.9 (H) 01/30/2021   Lab Results  Component Value Date   CHOL 178 08/15/2020   HDL 62.20 08/15/2020   LDLCALC 86 08/15/2020   LDLDIRECT 79.0 11/19/2016   TRIG 146.0 08/15/2020   CHOLHDL 3 08/15/2020   Lab Results  Component Value Date   CREATININE 0.83 01/30/2021   BUN 13 01/30/2021   NA 135 01/30/2021   K 3.3 (L) 01/30/2021   CL 92 (L) 01/30/2021   CO2 30 01/30/2021    SDOH:  (Social Determinants of Health) assessments and interventions performed:  SDOH Interventions    Flowsheet Row Most Recent Value  SDOH Interventions  Financial Strain Interventions Other (Comment)  [manufacturer assistance]       CCM Care Plan  Review of patient past medical history, allergies, medications, health status, including review of consultants reports, laboratory and other test data, was performed as part of comprehensive evaluation and provision of chronic care management services.   Care Plan : Medication Management  Updates made by De Hollingshead, RPH-CPP since 07/26/2021 12:00 AM     Problem: Diabetes, Depression, Anxiety, Pain      Long-Range Goal: Disease Progression Prevention   Recent Progress: On track  Priority: High  Note:   Current Barriers:  Unable to independently afford treatment regimen Unable to  achieve control of diabetes   Pharmacist Clinical Goal(s):  Over the next 90 days, patient will verbalize ability to afford treatment regimen through collaboration with PharmD and provider.   Interventions: 1:1 collaboration with Crecencio Mc, MD regarding development and update of comprehensive plan of care as evidenced by provider attestation and co-signature Inter-disciplinary care team collaboration (see longitudinal plan of care) Comprehensive medication review performed; medication list updated in electronic medical record  Health Maintenance   Yearly diabetic eye exam: due Yearly diabetic foot exam: due Urine microalbumin: due Yearly influenza vaccination: due - recommended  Td/Tdap vaccination: up to date Pneumonia vaccination: up to date  COVID vaccinations: due- encouraged Shingrix vaccinations: due - encouraged Colonoscopy: up to date Mammogram: due  Diabetes: Controlled per last A1c; current treatment: Invokana 300 mg Hx metformin - patient reports it was stopped due to a history of lactic acidosis Hx glipizide - reports it was discontinued due to hypoglycemia Current glucose readings: not checking frequently as she ran low on test strips Discussed Ozempic therapy as previously recommended by PCP. Patient overdue for PCP follow up and lab work. Scheduled f/u PCP visit and fasting labs prior Script for glucometer sent to [facility] to see if they can bill via Part B. If not, advised patient to purchase Relion Brand glucometer, test strips, lancets from [place].   Hypertension with edema: Controlled per last clinic reading, current regimen: furosemide 20 mg BID, spironolactone 100 mg daily Previously recommended to continue current regimen at this time.   Hyperlipidemia and primary ASCVD risk reduction: Controlled per last lipid panel, current regimen: atorvastatin 40 mg daily Previously recommended to continue current regimen at this  time  Depression/Anxiety, along with chronic pain Improved per patient report; current treatment: duloxetine 90 mg daily, diazepam 10 mg QPM PRN (using ~3-4 times weekly), tramadol 50-100 mg Q6H around the clock Reports she discontinued gabapentin due to lack of benefit  Previously recommended to continue current regimen at this time  Lymphocytic Colitis: Improved significantly with treatment; current regimen: budesonide 9 mg daily Using [facility]Rx card at [facility], reports this is affordable Reports significant improvement in episodes of diarrhea. Feeling much better overall.   Patient Goals/Self-Care Activities Over the next 90 days, patient will:  - take medications as prescribed check glucose daily, document, and provide at future appointments collaborate with provider on medication access solutions      Plan: Telephone follow up appointment with care management team member scheduled for:  8 weeks  [name], PharmD, [place], [place] Pharmacist [employer] at [employer] [phone]

## 2021-07-26 NOTE — Patient Instructions (Signed)
Ronny,   It was great talking with you today!  See if [facility] can bill Medicare Part B for the glucometer. If not, you can look for the Relion brand glucometer, test strips, and lancets at [facility].   We recommend you get the influenza vaccine for this season.   We recommend you get the updated bivalent COVID-19 booster, at least 2 months after any prior doses. You may consider delaying a booster dose by 3 months from a prior episode of COVID-19 per the CDC.   You can find pharmacies that have this formulation in stock at [email or web address].   Please make sure you schedule your yearly diabetic eye exam and have those results sent to our office.   We also recommend that everyone pursue the Shingrix (shingles) vaccine. Talk to your local pharmacy about the copay for this.   Take care!  [name], PharmD  Visit Information  Following are the goals we discussed today:  Patient Goals/Self-Care Activities Over the next 90 days, patient will:  - take medications as prescribed check glucose daily, document, and provide at future appointments collaborate with provider on medication access solutions        Plan: Telephone follow up appointment with care management team member scheduled for:  8 weeks   [name], PharmD, [place], CPP Clinical Pharmacist [place] at [facility] [phone]   Please call the care guide team at [phone] if you need to cancel or reschedule your appointment.   Patient verbalizes understanding of instructions provided today and agrees to view in [place].

## 2021-07-26 NOTE — Telephone Encounter (Signed)
Returned call. See CCM documentation 

## 2021-08-05 DIAGNOSIS — Z7984 Long term (current) use of oral hypoglycemic drugs: Secondary | ICD-10-CM

## 2021-08-05 DIAGNOSIS — F17210 Nicotine dependence, cigarettes, uncomplicated: Secondary | ICD-10-CM

## 2021-08-05 DIAGNOSIS — F32A Depression, unspecified: Secondary | ICD-10-CM | POA: Diagnosis not present

## 2021-08-05 DIAGNOSIS — E1159 Type 2 diabetes mellitus with other circulatory complications: Secondary | ICD-10-CM | POA: Diagnosis not present

## 2021-08-05 DIAGNOSIS — E785 Hyperlipidemia, unspecified: Secondary | ICD-10-CM

## 2021-08-05 DIAGNOSIS — I10 Essential (primary) hypertension: Secondary | ICD-10-CM | POA: Diagnosis not present

## 2021-08-25 ENCOUNTER — Other Ambulatory Visit: Payer: Medicare Other

## 2021-08-28 ENCOUNTER — Other Ambulatory Visit (INDEPENDENT_AMBULATORY_CARE_PROVIDER_SITE_OTHER): Payer: Medicare Other

## 2021-08-28 ENCOUNTER — Other Ambulatory Visit: Payer: Self-pay

## 2021-08-28 DIAGNOSIS — E1140 Type 2 diabetes mellitus with diabetic neuropathy, unspecified: Secondary | ICD-10-CM | POA: Diagnosis not present

## 2021-08-28 DIAGNOSIS — E785 Hyperlipidemia, unspecified: Secondary | ICD-10-CM | POA: Diagnosis not present

## 2021-08-28 LAB — CBC WITH DIFFERENTIAL/PLATELET
Basophils Absolute: 0.1 10*3/uL (ref 0.0–0.1)
Basophils Relative: 0.9 % (ref 0.0–3.0)
Eosinophils Absolute: 0 10*3/uL (ref 0.0–0.7)
Eosinophils Relative: 0.4 % (ref 0.0–5.0)
HCT: 47.8 % — ABNORMAL HIGH (ref 36.0–46.0)
Hemoglobin: 15.9 g/dL — ABNORMAL HIGH (ref 12.0–15.0)
Lymphocytes Relative: 18.1 % (ref 12.0–46.0)
Lymphs Abs: 2.3 10*3/uL (ref 0.7–4.0)
MCHC: 33.2 g/dL (ref 30.0–36.0)
MCV: 96.1 fl (ref 78.0–100.0)
Monocytes Absolute: 0.9 10*3/uL (ref 0.1–1.0)
Monocytes Relative: 6.6 % (ref 3.0–12.0)
Neutro Abs: 9.6 10*3/uL — ABNORMAL HIGH (ref 1.4–7.7)
Neutrophils Relative %: 74 % (ref 43.0–77.0)
Platelets: 250 10*3/uL (ref 150.0–400.0)
RBC: 4.98 Mil/uL (ref 3.87–5.11)
RDW: 14.5 % (ref 11.5–15.5)
WBC: 13 10*3/uL — ABNORMAL HIGH (ref 4.0–10.5)

## 2021-08-28 LAB — COMPREHENSIVE METABOLIC PANEL
ALT: 25 U/L (ref 0–35)
AST: 20 U/L (ref 0–37)
Albumin: 4.2 g/dL (ref 3.5–5.2)
Alkaline Phosphatase: 100 U/L (ref 39–117)
BUN: 21 mg/dL (ref 6–23)
CO2: 32 mEq/L (ref 19–32)
Calcium: 9 mg/dL (ref 8.4–10.5)
Chloride: 94 mEq/L — ABNORMAL LOW (ref 96–112)
Creatinine, Ser: 0.78 mg/dL (ref 0.40–1.20)
GFR: 80.95 mL/min (ref >60.00)
Glucose, Bld: 135 mg/dL — ABNORMAL HIGH (ref 70–99)
Potassium: 3.9 mEq/L (ref 3.5–5.1)
Sodium: 135 mEq/L (ref 135–145)
Total Bilirubin: 0.7 mg/dL (ref 0.2–1.2)
Total Protein: 6.7 g/dL (ref 6.0–8.3)

## 2021-08-28 LAB — HEMOGLOBIN A1C: Hgb A1c MFr Bld: 6.9 % — ABNORMAL HIGH (ref 4.6–6.5)

## 2021-08-28 LAB — LIPID PANEL
Cholesterol: 212 mg/dL — ABNORMAL HIGH (ref 0–200)
HDL: 113.6 mg/dL (ref >39.00)
LDL Cholesterol: 82 mg/dL (ref 0–99)
NonHDL: 98.18
Total CHOL/HDL Ratio: 2
Triglycerides: 80 mg/dL (ref 0.0–149.0)
VLDL: 16 mg/dL (ref 0.0–40.0)

## 2021-08-28 LAB — MICROALBUMIN / CREATININE URINE RATIO
Creatinine,U: 11.1 mg/dL
Microalb Creat Ratio: 6.3 mg/g (ref 0.0–30.0)
Microalb, Ur: <0.7 mg/dL (ref 0.0–1.9)

## 2021-08-30 ENCOUNTER — Ambulatory Visit (INDEPENDENT_AMBULATORY_CARE_PROVIDER_SITE_OTHER): Payer: Medicare Other | Admitting: Internal Medicine

## 2021-08-30 ENCOUNTER — Encounter: Payer: Self-pay | Admitting: Internal Medicine

## 2021-08-30 ENCOUNTER — Telehealth: Payer: Self-pay | Admitting: Internal Medicine

## 2021-08-30 ENCOUNTER — Telehealth: Payer: Self-pay | Admitting: Pharmacist

## 2021-08-30 ENCOUNTER — Other Ambulatory Visit: Payer: Self-pay

## 2021-08-30 VITALS — BP 124/76 | HR 109 | Temp 98.3°F | Ht 60.0 in | Wt 149.0 lb

## 2021-08-30 DIAGNOSIS — Z23 Encounter for immunization: Secondary | ICD-10-CM | POA: Diagnosis not present

## 2021-08-30 DIAGNOSIS — K529 Noninfective gastroenteritis and colitis, unspecified: Secondary | ICD-10-CM

## 2021-08-30 DIAGNOSIS — Z1231 Encounter for screening mammogram for malignant neoplasm of breast: Secondary | ICD-10-CM

## 2021-08-30 DIAGNOSIS — C439 Malignant melanoma of skin, unspecified: Secondary | ICD-10-CM

## 2021-08-30 DIAGNOSIS — E785 Hyperlipidemia, unspecified: Secondary | ICD-10-CM | POA: Diagnosis not present

## 2021-08-30 DIAGNOSIS — E1140 Type 2 diabetes mellitus with diabetic neuropathy, unspecified: Secondary | ICD-10-CM

## 2021-08-30 DIAGNOSIS — K52832 Lymphocytic colitis: Secondary | ICD-10-CM

## 2021-08-30 DIAGNOSIS — E041 Nontoxic single thyroid nodule: Secondary | ICD-10-CM

## 2021-08-30 DIAGNOSIS — D751 Secondary polycythemia: Secondary | ICD-10-CM

## 2021-08-30 NOTE — Patient Instructions (Signed)
I have ordered a thyroid ultrasound and a follow up with Dr Gasper Sells for now    Keep working on cutting down the tobacco use and your hemoglobin will continue to normalize
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
## 2021-08-30 NOTE — Assessment & Plan Note (Signed)
S/p colonoscopy Oct 2022:  Colitis noted.

## 2021-08-30 NOTE — Telephone Encounter (Signed)
Lft pt vm to call ofc to sch US thyroid. Thank you

## 2021-08-30 NOTE — Telephone Encounter (Signed)
Met with patient while in office with PCP. Completed patient and provider portions of Invokana patient assistance application. Will pass along to CPhT to submit.

## 2021-08-30 NOTE — Assessment & Plan Note (Signed)
Found on colonoscopy Oct 2022 [facility])..  Treated with budesonide 9 mg

## 2021-08-31 ENCOUNTER — Telehealth: Payer: Self-pay | Admitting: Pharmacy Technician

## 2021-08-31 DIAGNOSIS — C439 Malignant melanoma of skin, unspecified: Secondary | ICD-10-CM | POA: Insufficient documentation

## 2021-08-31 DIAGNOSIS — Z596 Low income: Secondary | ICD-10-CM

## 2021-08-31 NOTE — Assessment & Plan Note (Signed)
Her neuropathy is managed with gabapentin.  Her diabetes is Currently well managed with Invokanna only due to metformin intolerance.   She is willing to inject and is interested in an alternative medication given her history of interstitial cystitis.  Considering ozempic once thyroid CA is ruled out  Lab Results  Component Value Date   HGBA1C 6.9 (H) 08/28/2021

## 2021-08-31 NOTE — Progress Notes (Signed)
[facility])                                            [place] Team    08/31/2021  Kristie Jones 1956/02/22 [id number]                                      Medication Assistance Referral  Referral From: [facility] Embedded RPh Catie T.   Medication/Company: Anastasio Auerbach / [employer] Patient application portion: Interoffice Mailed Provider application portion: Interoffice Mailed to Dr. Derrel Nip Provider address/fax verified via: Office website   [name] P. [name], [place]  [phone]

## 2021-08-31 NOTE — Assessment & Plan Note (Signed)
Managed with atorvastatin. LDL is at goal   Lab Results  Component Value Date   CHOL 212 (H) 08/28/2021   HDL 113.60 08/28/2021   LDLCALC 82 08/28/2021   LDLDIRECT 79.0 11/19/2016   TRIG 80.0 08/28/2021   CHOLHDL 2 08/28/2021

## 2021-08-31 NOTE — Assessment & Plan Note (Addendum)
Found on her right buttock.  Continue 4 month dermatology follow up visits.

## 2021-08-31 NOTE — Assessment & Plan Note (Signed)
Improving with reduced tobacco use.  Will phlebotmize  For hct >55%

## 2021-09-05 ENCOUNTER — Telehealth: Payer: Self-pay | Admitting: Pharmacy Technician

## 2021-09-05 DIAGNOSIS — Z596 Low income: Secondary | ICD-10-CM

## 2021-09-05 NOTE — Progress Notes (Signed)
[facility])                                            [place] Team    09/05/2021  Kristie Jones [birth year] [id number]  Received both patient and provider portion(s) of patient assistance application(s) for Invokana. Faxed completed application and required documents into [employer].   Cleburne Savini P. Kalab Camps, [place]  [phone]

## 2021-09-07 ENCOUNTER — Telehealth: Payer: Self-pay | Admitting: Pharmacy Technician

## 2021-09-07 DIAGNOSIS — Z596 Low income: Secondary | ICD-10-CM

## 2021-09-07 NOTE — Progress Notes (Signed)
[facility])                                            [place] Team    09/07/2021  Kristie Jones 07-11-1956 [id number]  Care coordination call placed to [employer] in regard to [facility] application.  Spoke to [place] who informs patient is DENIED due to patient having insurance. Patient will need to apply thru [employer] by going to www.newprograminfo.com to get the application or by calling [phone].  Successful outreach call placed to patient, HIPAA verified. Informed patient of the above information. Patient verbalized interest in applying with Alphonsa Overall.   Will mail patient an application when in office next week.  Dorine Duffey P. Olliver Boyadjian, [place]  [phone]

## 2021-09-08 ENCOUNTER — Other Ambulatory Visit: Payer: Self-pay | Admitting: Internal Medicine

## 2021-09-08 DIAGNOSIS — Z760 Encounter for issue of repeat prescription: Secondary | ICD-10-CM

## 2021-09-08 NOTE — Telephone Encounter (Signed)
Refilled: 03/07/2021 Last OV: 08/30/2021 Next OV: not scheduled

## 2021-09-11 ENCOUNTER — Other Ambulatory Visit: Payer: Self-pay

## 2021-09-11 ENCOUNTER — Ambulatory Visit
Admission: RE | Admit: 2021-09-11 | Discharge: 2021-09-11 | Disposition: A | Discharge status: Home / Self Care | Payer: Medicare Other | Source: Ambulatory Visit | Attending: Internal Medicine | Admitting: Internal Medicine

## 2021-09-11 DIAGNOSIS — E041 Nontoxic single thyroid nodule: Secondary | ICD-10-CM | POA: Diagnosis not present

## 2021-09-12 ENCOUNTER — Telehealth: Payer: Self-pay | Admitting: Pharmacy Technician

## 2021-09-12 DIAGNOSIS — Z596 Low income: Secondary | ICD-10-CM

## 2021-09-12 NOTE — Progress Notes (Addendum)
09/25/2021-ADDENDUM  Patient assistance for Invokana with Kristie Jones has been stopped as medication has been discontinued per in basket message received from embedded PharmD 09/25/21.  Kristie Jones P. Kristie Jones, [place]  [phone]                                         [place] [facility])                                            [place] Team    09/12/2021  Kristie Jones [birth year] [id number]                                      Medication Assistance Referral  Referral From: [facility] Embedded RPh Catie T.   Medication/Company: Kristie Jones Patient application portion:  Mailed Provider application portion: Interoffice Mailed to Kristie Jones Provider address/fax verified via: Office website    Kristie Jones, [place]  [phone]

## 2021-09-18 ENCOUNTER — Other Ambulatory Visit: Payer: Self-pay | Admitting: Internal Medicine

## 2021-09-18 DIAGNOSIS — Z20822 Contact with and (suspected) exposure to covid-19: Secondary | ICD-10-CM

## 2021-09-19 ENCOUNTER — Telehealth: Payer: Self-pay | Admitting: Internal Medicine

## 2021-09-19 NOTE — Telephone Encounter (Signed)
Called patient back. Reviewed, as she had previously discussed with CPhT, that [employer] denied her but we could apply for Invokana assistance through [place]. Sharee Pimple mailed this application to her last week.   However, patient pointed out that thyroid US results are back and did not meet criteria for biopsy, so she asked if starting GLP1 therapy would be appropriate. Will route to PCP for her decision on this. If appropriate to start GLP1 therapy, will plan to stop Invokana (and stop attempts at patient assistance) as patient is reporting periodic yeast infections.

## 2021-09-19 NOTE — Telephone Encounter (Addendum)
Pt called in stating that she have received a denial letter from [facility] about medication (INVOKANA 300 MG TABS tablet). Pt stated that the letter advise her that she is no longer approved for the program because Pt has medicare part d. Pt stated that she doesn't have medicare part d that she only have medicare part a and b. Pt requesting callback.

## 2021-09-19 NOTE — Telephone Encounter (Signed)
Called patient. Rescheduled upcoming appointment to be face to face, will start GLP1 and complete patient assistance paperwork

## 2021-09-25 ENCOUNTER — Other Ambulatory Visit: Payer: Self-pay

## 2021-09-25 ENCOUNTER — Telehealth: Payer: Self-pay | Admitting: Internal Medicine

## 2021-09-25 ENCOUNTER — Ambulatory Visit (INDEPENDENT_AMBULATORY_CARE_PROVIDER_SITE_OTHER): Payer: Medicare Other | Admitting: Pharmacist

## 2021-09-25 DIAGNOSIS — D751 Secondary polycythemia: Secondary | ICD-10-CM

## 2021-09-25 DIAGNOSIS — E785 Hyperlipidemia, unspecified: Secondary | ICD-10-CM

## 2021-09-25 DIAGNOSIS — E1140 Type 2 diabetes mellitus with diabetic neuropathy, unspecified: Secondary | ICD-10-CM

## 2021-09-25 MED ORDER — OZEMPIC (0.25 OR 0.5 MG/DOSE) 2 MG/1.5ML ~~LOC~~ SOPN: 0.5000 mg | PEN_INJECTOR | SUBCUTANEOUS | 1 refills | Status: DC

## 2021-09-25 NOTE — Chronic Care Management (AMB) (Signed)
Chronic Care Management CCM Pharmacy Note  09/25/2021 Name:  Kristie Jones MRN:  [id number] DOB:  05-Dec-1955  Summary: - Interested in [place], reports periodi symptoms of yeast infections with Invokana.   Recommendations/Changes made from today's visit: - Stop Invokana. Start Ozempic 0.25 mg weekly x 4 weeks then increase to 0.5 mg weekly. Assi  Subjective: Kristie Jones is an 66 y.o. year old female who is a primary patient of [name], Aris Everts, MD.  The CCM team was consulted for assistance with disease management and care coordination needs.    Engaged with patient face to face for follow up visit for pharmacy case management and/or care coordination services.   Objective:  Medications Reviewed Today     Reviewed by Adair Laundry, [place] (Certified Medical Assistant) on 08/30/21 at 1600  Med List Status: <None>   Medication Order Taking? Sig Documenting Provider Last Dose Status Informant  atorvastatin (LIPITOR) 40 MG tablet [id number] Yes TAKE 1 TABLET BY MOUTH ONCE DAILY. Crecencio Mc, MD Taking Active   blood glucose meter kit and supplies KIT [id number] Yes Dispense based on patient and insurance preference. Use up to four times daily as directed. Crecencio Mc, MD Taking Active   budesonide (ENTOCORT EC) 3 MG 24 hr capsule [id number] Yes Take 3 capsules (9 mg total) by mouth every morning. Lucilla Lame, MD Taking Active   diazepam (VALIUM) 10 MG tablet [id number] Yes TAKE 1 TABLET AT BEDTIME AS NEEDED FOR ANXIETY Crecencio Mc, MD Taking Active   DULoxetine (CYMBALTA) 30 MG capsule [id number] Yes TAKE 1 TABLET BY MOUTH ONCE DAILY. (TO BE TAKEN WITH 60MG TABLET) Crecencio Mc, MD Taking Active   DULoxetine (CYMBALTA) 60 MG capsule [id number] Yes TAKE (1) CAPSULE BY MOUTH ONCE DAILY. Crecencio Mc, MD Taking Active   furosemide (LASIX) 20 MG tablet [id number] Yes TAKE 1 TABLET BY MOUTH TWICE DAILY Crecencio Mc, MD Taking Active            Med Note Kelby Aline Jul 26, 2021  4:41 PM) Taking 40 mg QAM  INVOKANA 300 MG TABS tablet [id number] Yes TAKE 1 TABLET BY MOUTH IN THE MORNING. Crecencio Mc, MD Taking Active   ondansetron (ZOFRAN-ODT) 4 MG disintegrating tablet [id number] Yes DISSOLVE 1 TABLET BY MOUTH EVERY 8 HOURS AS NEEDED FOR NAUSEA & VOMITING. *MAY TAKE 2 AT A TIME IF NEEDED* Crecencio Mc, MD Taking Active   spironolactone (ALDACTONE) 100 MG tablet [id number] Yes TAKE 1 TABLET BY MOUTH ONCE DAILY. Crecencio Mc, MD Taking Active   traMADol Veatrice Bourbon) 50 MG tablet [id number] Yes TAKE (2) TABLETS BY MOUTH EVERY SIX HOURS AS NEEDED FOR PAIN. Crecencio Mc, MD Taking Active             Pertinent Labs:   Lab Results  Component Value Date   HGBA1C 6.9 (H) 08/28/2021   Lab Results  Component Value Date   CHOL 212 (H) 08/28/2021   HDL 113.60 08/28/2021   LDLCALC 82 08/28/2021   LDLDIRECT 79.0 11/19/2016   TRIG 80.0 08/28/2021   CHOLHDL 2 08/28/2021   Lab Results  Component Value Date   CREATININE 0.78 08/28/2021   BUN 21 08/28/2021   NA 135 08/28/2021   K 3.9 08/28/2021   CL 94 (L) 08/28/2021   CO2 32 08/28/2021    SDOH:  (Social Determinants of Health) assessments and interventions performed:  SDOH Interventions    Flowsheet Row Most  Recent Value  SDOH Interventions   Financial Strain Interventions Other (Comment)  [manufacturer assistance]       CCM Care Plan  Review of patient past medical history, allergies, medications, health status, including review of consultants reports, laboratory and other test data, was performed as part of comprehensive evaluation and provision of chronic care management services.   Care Plan : Medication Management  Updates made by De Hollingshead, RPH-CPP since 09/25/2021 12:00 AM     Problem: Diabetes, Depression, Anxiety, Pain      Long-Range Goal: Disease Progression Prevention   Recent Progress: On track  Priority: High  Note:   Current Barriers:  Unable to  independently afford treatment regimen Unable to achieve control of diabetes   Pharmacist Clinical Goal(s):  Over the next 90 days, patient will verbalize ability to afford treatment regimen through collaboration with PharmD and provider.   Interventions: 1:1 collaboration with Crecencio Mc, MD regarding development and update of comprehensive plan of care as evidenced by provider attestation and co-signature Inter-disciplinary care team collaboration (see longitudinal plan of care) Comprehensive medication review performed; medication list updated in electronic medical record  Health Maintenance   Yearly diabetic eye exam: due - encouraged to schedule Yearly diabetic foot exam: up to date Urine microalbumin: up to date Yearly influenza vaccination: due  Td/Tdap vaccination: up to date Pneumonia vaccination: up to date  COVID vaccinations: due- encouraged Shingrix vaccinations: due  Colonoscopy: up to date Mammogram: due - encouraged to schedule  Diabetes: Controlled per last A1c; current treatment: Invokana 300 mg Hx metformin - patient reports it was stopped due to a history of lactic acidosis Hx glipizide - reports it was discontinued due to hypoglycemia Current glucose readings: not checking frequently as she ran low on test strips Patient reported burning/itching symptoms that she believes is yeast infection periodically with Invokana. Interested in switching to [place]. US thyroid did not require biopsy. Counseled on GLP1 agonists, including mechanism of action, side effects, and benefits. No personal or family history of medullary thyroid cancer, personal history of pancreatitis or gallbladder disease. Counseled on potential side effects of nausea, stomach upset, queasiness, constipation, and that these generally improve over time. Advised to contact our office with more severe symptoms, including nausea, diarrhea, stomach pain. Patient verbalized understanding. Start Ozempic  0.25 mg weekly for 4 weeks then increase to 0.5 mg weekly. Stop Invokana due to intolerability.  Will collaborate with patient, provider, and CPhT for patient assistance application.   Hypertension with edema: Controlled per last clinic reading, current regimen: furosemide 20 mg BID, spironolactone 100 mg daily Previously recommended to continue current regimen at this time.   Hyperlipidemia and primary ASCVD risk reduction: Controlled per last lipid panel, current regimen: atorvastatin 40 mg daily Previously recommended to continue current regimen at this time  Depression/Anxiety, along with chronic pain Improved per patient report; current treatment: duloxetine 90 mg daily, diazepam 10 mg QPM PRN (using ~3-4 times weekly), tramadol 50-100 mg Q6H around the clock Reports she discontinued gabapentin due to lack of benefit  Previously recommended to continue current regimen at this time  Lymphocytic Colitis: Improved significantly with treatment; current regimen: budesonide 9 mg daily Using [facility]Rx card at [facility], reports this is affordable Reports significant improvement in episodes of diarrhea. Feeling much better overall.   Patient Goals/Self-Care Activities Over the next 90 days, patient will:  - take medications as prescribed check glucose daily, document, and provide at future appointments collaborate with provider on medication access solutions  Plan: Telephone follow up appointment with care management team member scheduled for:  6 weeks  [name], PharmD, [place], [place] Clinical Pharmacist [employer] at [employer] [phone]

## 2021-09-25 NOTE — Patient Instructions (Signed)
[place],   Start Ozempic 0.25 mg weekly for 4 weeks, then increase to 0.5 mg weekly as tolerated. This medication may cause stomach upset, queasiness, or constipation, especially when first starting. This generally improves over time. Call our office if these symptoms occur and worsen, or if you have severe symptoms such as vomiting, diarrhea, or stomach pain.   Schedule fasting labs after April 23rd and then an in-office appointment with Dr. Derrel Nip.   Call [facility] to schedule your mammogram - [phone].   Schedule your yearly diabetic eye exam and have those results sent to our office when available.    Take care!  [name], PharmD  Visit Information  Following are the goals we discussed today:  Patient Goals/Self-Care Activities Over the next 90 days, patient will:  - take medications as prescribed check glucose daily, document, and provide at future appointments collaborate with provider on medication access solutions        Plan: Telephone follow up appointment with care management team member scheduled for:  6 weeks   [name], PharmD, [place], CPP Clinical Pharmacist [place] at [facility] [phone]     Please call the care guide team at [phone] if you need to cancel or reschedule your appointment.   The patient verbalized understanding of instructions, educational materials, and care plan provided today and agreed to receive a mailed copy of patient instructions, educational materials, and care plan.

## 2021-09-25 NOTE — Addendum Note (Signed)
Addended by: De Hollingshead on: 09/25/2021 03:55 PM   Modules accepted: Orders

## 2021-09-25 NOTE — Telephone Encounter (Signed)
Pt called wanting to know If she starts the ozempic tomorrow and she also wants to know the update of the diabetic meter being sent in

## 2021-09-26 ENCOUNTER — Telehealth: Payer: Self-pay | Admitting: Pharmacy Technician

## 2021-09-26 DIAGNOSIS — Z596 Low income: Secondary | ICD-10-CM

## 2021-09-26 NOTE — Telephone Encounter (Signed)
Called [facility]. They needed patient's Medicare A/B ID. I provided that to them. They are going to fax a CMN form to [place] to fill out and return.   Reports she has not started Ozempic yet, as she woke up nauseated this morning. She plans t to start tomorrow.

## 2021-09-26 NOTE — Progress Notes (Signed)
[facility])                                            [place] Team    09/26/2021  Kristie Jones October 21, 1955 [id number]                                      Medication Assistance Referral  Referral From: [facility] Embedded RPh Catie T.   Medication/Company: Kristie Jones / [employer] Patient application portion: [place] portion: Interoffice Mailed to Dr. Deborra Medina Provider address/fax verified via: Office website  Received both patient and provider portion(s) of patient assistance application(s) for Ozempic. Faxed completed application and required documents into [employer].    Kristie Jones, Kristie Jones  [phone]

## 2021-09-28 ENCOUNTER — Telehealth: Payer: Self-pay | Admitting: Pharmacy Technician

## 2021-09-28 DIAGNOSIS — Z596 Low income: Secondary | ICD-10-CM

## 2021-09-28 NOTE — Progress Notes (Signed)
[facility])                                            [place] Team    09/28/2021  Kristie Jones 1956/04/16 [id number]  Care coordination call placed to [place] in regard to [place] application.  Spoke to [place] who informs patient is APPROVED 09/28/2021-09/23/2022. The patient's id is [phone]. She informs medication will be delivered to the provider's office and that process can take up to 45 days.  Lorae Roig P. Stephenia Vogan, [place]  [phone]

## 2021-10-03 DIAGNOSIS — E785 Hyperlipidemia, unspecified: Secondary | ICD-10-CM | POA: Diagnosis not present

## 2021-10-03 DIAGNOSIS — E1140 Type 2 diabetes mellitus with diabetic neuropathy, unspecified: Secondary | ICD-10-CM

## 2021-10-03 DIAGNOSIS — I10 Essential (primary) hypertension: Secondary | ICD-10-CM

## 2021-10-08 ENCOUNTER — Other Ambulatory Visit: Payer: Self-pay | Admitting: Internal Medicine

## 2021-10-10 ENCOUNTER — Ambulatory Visit: Payer: Self-pay | Admitting: Pharmacist

## 2021-10-10 NOTE — Chronic Care Management (AMB) (Signed)
?  Chronic Care Management  ? ?Note ? ?10/10/2021 ?Name: DANISHA BRASSFIELD MRN: [id number] DOB: 1955/10/25 ? ? ? ?Closing pharmacy CCM case at this time. Will collaborate with Care Guide to outreach to schedule follow up with RN CM. Patient has clinic contact information for future questions or concerns.  ? ?[name], PharmD, [place], CPP ?Clinical Pharmacist ?Therapist, music at [employer] ?[phone] ? ?

## 2021-10-11 ENCOUNTER — Ambulatory Visit (INDEPENDENT_AMBULATORY_CARE_PROVIDER_SITE_OTHER): Payer: Medicare Other

## 2021-10-11 VITALS — Ht 60.0 in | Wt 149.0 lb

## 2021-10-11 DIAGNOSIS — Z0000 Encounter for general adult medical examination without abnormal findings: Secondary | ICD-10-CM | POA: Diagnosis not present

## 2021-10-11 NOTE — Patient Instructions (Addendum)
Kristie Jones , Thank you for taking time to come for your Medicare Wellness Visit. I appreciate your ongoing commitment to your health goals. Please review the following plan we discussed and let me know if I can assist you in the future.   These are the goals we discussed:  Goals       Patient Stated     Follow up with PCP as needed (pt-stated)      Take all medications as prescribed Stop smoking      I need resource education (pt-stated)      CARE PLAN ENTRY (see longtitudinal plan of care for additional care plan information)  Current Barriers:  Financial constraints related to managing health care expenses Limited social support ADL IADL limitations Mental Health Concerns  Social Isolation  Clinical Social Work Clinical Goal(s):  Over the next 120 days, patient will demonstrate improved health management independence as evidenced byimplementing appropriate coping skills and mental health resources to alleviate depression and anxiety.   Interventions: Patient interviewed and appropriate assessments performed Provided mental health counseling with regard to anxiety and depression  Discussed plans with patient for ongoing care management follow up and provided patient with direct contact information for care management team Crisis resource education provided to patient today Praise provided for successful implementation of positive thinking, prayi Assisted patient/caregiver with obtaining information about health plan benefits LCSW received incoming return phone call from patient today on 11/04/19. Patient has been struggling with ongoing depression and anxiety and is currently taking Duloxetine 90 mg (daily) diazepam 10 mg (PRN.)  Patient has a therapist appointment tomorrow that she is looking forward to. She continues to have regular Zoom calls with this therapist through the shelter she previously stayed in. Patient reports multiple financial stressors as well and that she had to  file for bankruptcy this year. Patient has been denied disability in the past but is actively working on a new application request. Patient reports that she receives ongoing socialization by visiting her neighbors regularly. Patient reports that she is currently "making a plan for her happiness." Patient is in need of mental health resource education. LCSW sent text message to patient with these resources-[facility] Crisis Line: [phone] (24/7) [facility] Crisis Line: [phone] (24/7) [facility]: [phone] [facility] Care: [phone] [facility], [facility] [phone]         [facility]- [address], [place] [address]-Phone:[phone].  Patient is agreeable to investigate these resources this week as she has not had the time to do so yet. LCSW provided emotional support to patient and implemented solution focused interventions.   Patient Self Care Activities:  Attends all scheduled provider appointments Calls provider office for new concerns or questions   Initial goal documentation       Other     Cut out extra servings        This is a list of the screening recommended for you and due dates:  Health Maintenance  Topic Date Due   Mammogram  Never done   Eye exam for diabetics  11/30/2021*   Zoster (Shingles) Vaccine (1 of 2) 01/11/2022*   Hemoglobin A1C  02/25/2022   Urine Protein Check  08/28/2022   Complete foot exam   08/30/2022   Tetanus Vaccine  10/10/2022   Colon Cancer Screening  05/26/2031   Flu Shot  Completed   Hepatitis C Screening: USPSTF Recommendation to screen - Ages 18-79 yo.  Completed  HPV Vaccine  Aged Out   COVID-19 Vaccine  Discontinued   HIV Screening  Discontinued  *Topic was postponed. The date shown is not the original due date.    Conditions/risks identified: Patient reports taking Ozempic first two doses incorrectly. Did not remove cap  appropriately and medication wasted. First dose, correct injection given 10/11/21.   Next appointment: Follow up in one year for your annual wellness visit.   Preventive Care 40-64 Years, Female Preventive care refers to lifestyle choices and visits with your health care provider that can promote health and wellness. What does preventive care include? A yearly physical exam. This is also called an annual well check. Dental exams once or twice a year. Routine eye exams. Ask your health care provider how often you should have your eyes checked. Personal lifestyle choices, including: Daily care of your teeth and gums. Regular physical activity. Eating a healthy diet. Avoiding tobacco and drug use. Limiting alcohol use. Practicing safe sex. Taking low-dose aspirin daily starting at age 47. Taking vitamin and mineral supplements as recommended by your health care provider. What happens during an annual well check? The services and screenings done by your health care provider during your annual well check will depend on your age, overall health, lifestyle risk factors, and family history of disease. Counseling  Your health care provider may ask you questions about your: Alcohol use. Tobacco use. Drug use. Emotional well-being. Home and relationship well-being. Sexual activity. Eating habits. Work and work Astronomer. Method of birth control. Menstrual cycle. Pregnancy history. Screening  You may have the following tests or measurements: Height, weight, and BMI. Blood pressure. Lipid and cholesterol levels. These may be checked every 5 years, or more frequently if you are over 33 years old. Skin check. Lung cancer screening. You may have this screening every year starting at age 17 if you have a 30-pack-year history of smoking and currently smoke or have quit within the past 15 years. Fecal occult blood test (FOBT) of the stool. You may have this test every year starting at age  43. Flexible sigmoidoscopy or colonoscopy. You may have a sigmoidoscopy every 5 years or a colonoscopy every 10 years starting at age 57. Hepatitis C blood test. Hepatitis B blood test. Sexually transmitted disease (STD) testing. Diabetes screening. This is done by checking your blood sugar (glucose) after you have not eaten for a while (fasting). You may have this done every 1-3 years. Mammogram. This may be done every 1-2 years. Talk to your health care provider about when you should start having regular mammograms. This may depend on whether you have a family history of breast cancer. BRCA-related cancer screening. This may be done if you have a family history of breast, ovarian, tubal, or peritoneal cancers. Pelvic exam and Pap test. This may be done every 3 years starting at age 19. Starting at age 14, this may be done every 5 years if you have a Pap test in combination with an HPV test. Bone density scan. This is done to screen for osteoporosis. You may have this scan if you are at high risk for osteoporosis. Discuss your test results, treatment options, and if necessary, the need for more tests with your health care provider. Vaccines  Your health care provider may recommend certain vaccines, such as: Influenza vaccine. This is recommended every year. Tetanus, diphtheria, and acellular pertussis (Tdap, Td) vaccine. You may need a Td booster every 10 years. Zoster vaccine. You may need this after age 75.  Pneumococcal 13-valent conjugate (PCV13) vaccine. You may need this if you have certain conditions and were not previously vaccinated. Pneumococcal polysaccharide (PPSV23) vaccine. You may need one or two doses if you smoke cigarettes or if you have certain conditions. Talk to your health care provider about which screenings and vaccines you need and how often you need them. This information is not intended to replace advice given to you by your health care provider. Make sure you discuss  any questions you have with your health care provider. Document Released: 08/19/2015 Document Revised: 04/11/2016 Document Reviewed: 05/24/2015 Elsevier Interactive Patient Education  2017 [employer].    Fall Prevention in the Home Falls can cause injuries. They can happen to people of all ages. There are many things you can do to make your home safe and to help prevent falls. What can I do on the outside of my home? Regularly fix the edges of walkways and driveways and fix any cracks. Remove anything that might make you trip as you walk through a door, such as a raised step or threshold. Trim any bushes or trees on the path to your home. Use bright outdoor lighting. Clear any walking paths of anything that might make someone trip, such as rocks or tools. Regularly check to see if handrails are loose or broken. Make sure that both sides of any steps have handrails. Any raised decks and porches should have guardrails on the edges. Have any leaves, snow, or ice cleared regularly. Use sand or salt on walking paths during winter. Clean up any spills in your garage right away. This includes oil or grease spills. What can I do in the bathroom? Use night lights. Install grab bars by the toilet and in the tub and shower. Do not use towel bars as grab bars. Use non-skid mats or decals in the tub or shower. If you need to sit down in the shower, use a plastic, non-slip stool. Keep the floor dry. Clean up any water that spills on the floor as soon as it happens. Remove soap buildup in the tub or shower regularly. Attach bath mats securely with double-sided non-slip rug tape. Do not have throw rugs and other things on the floor that can make you trip. What can I do in the bedroom? Use night lights. Make sure that you have a light by your bed that is easy to reach. Do not use any sheets or blankets that are too big for your bed. They should not hang down onto the floor. Have a firm chair that  has side arms. You can use this for support while you get dressed. Do not have throw rugs and other things on the floor that can make you trip. What can I do in the kitchen? Clean up any spills right away. Avoid walking on wet floors. Keep items that you use a lot in easy-to-reach places. If you need to reach something above you, use a strong step stool that has a grab bar. Keep electrical cords out of the way. Do not use floor polish or wax that makes floors slippery. If you must use wax, use non-skid floor wax. Do not have throw rugs and other things on the floor that can make you trip. What can I do with my stairs? Do not leave any items on the stairs. Make sure that there are handrails on both sides of the stairs and use them. Fix handrails that are broken or loose. Make sure that handrails are as long  as the stairways. Check any carpeting to make sure that it is firmly attached to the stairs. Fix any carpet that is loose or worn. Avoid having throw rugs at the top or bottom of the stairs. If you do have throw rugs, attach them to the floor with carpet tape. Make sure that you have a light switch at the top of the stairs and the bottom of the stairs. If you do not have them, ask someone to add them for you. What else can I do to help prevent falls? Wear shoes that: Do not have high heels. Have rubber bottoms. Are comfortable and fit you well. Are closed at the toe. Do not wear sandals. If you use a stepladder: Make sure that it is fully opened. Do not climb a closed stepladder. Make sure that both sides of the stepladder are locked into place. Ask someone to hold it for you, if possible. Clearly mark and make sure that you can see: Any grab bars or handrails. First and last steps. Where the edge of each step is. Use tools that help you move around (mobility aids) if they are needed. These include: Canes. Walkers. Scooters. Crutches. Turn on the lights when you go into a dark  area. Replace any light bulbs as soon as they burn out. Set up your furniture so you have a clear path. Avoid moving your furniture around. If any of your floors are uneven, fix them. If there are any pets around you, be aware of where they are. Review your medicines with your doctor. Some medicines can make you feel dizzy. This can increase your chance of falling. Ask your doctor what other things that you can do to help prevent falls. This information is not intended to replace advice given to you by your health care provider. Make sure you discuss any questions you have with your health care provider. Opioid Pain Medicine Management Opioids are powerful medicines that are used to treat moderate to severe pain. When used for short periods of time, they can help you to: Sleep better. Do better in physical or occupational therapy. Feel better in the first few days after an injury. Recover from surgery. Opioids should be taken with the supervision of a trained health care provider. They should be taken for the shortest period of time possible. This is because opioids can be addictive, and the longer you take opioids, the greater your risk of addiction. This addiction can also be called opioid use disorder. What are the risks? Using opioid pain medicines for longer than 3 days increases your risk of side effects. Side effects include: Constipation. Nausea and vomiting. Breathing difficulties (respiratory depression). Drowsiness. Confusion. Opioid use disorder. Itching. Taking opioid pain medicine for a long period of time can affect your ability to do daily tasks. It also puts you at risk for: Motor vehicle crashes. Depression. Suicide. Heart attack. Overdose, which can be life-threatening. What is a pain treatment plan? A pain treatment plan is an agreement between you and your health care provider. Pain is unique to each person, and treatments vary depending on your condition. To manage  your pain, you and your health care provider need to work together. To help you do this: Discuss the goals of your treatment, including how much pain you might expect to have and how you will manage the pain. Review the risks and benefits of taking opioid medicines. Remember that a good treatment plan uses more than one approach and minimizes the chance of  side effects. Be honest about the amount of medicines you take and about any drug or alcohol use. Get pain medicine prescriptions from only one health care provider. Pain can be managed with many types of alternative treatments. Ask your health care provider to refer you to one or more specialists who can help you manage pain through: Physical or occupational therapy. Counseling (cognitive behavioral therapy). Good nutrition. Biofeedback. Massage. Meditation. Non-opioid medicine. Following a gentle exercise program. How to use opioid pain medicine Taking medicine Take your pain medicine exactly as told by your health care provider. Take it only when you need it. If your pain gets less severe, you may take less than your prescribed dose if your health care provider approves. If you are not having pain, do nottake pain medicine unless your health care provider tells you to take it. If your pain is severe, do nottry to treat it yourself by taking more pills than instructed on your prescription. Contact your health care provider for help. Write down the times when you take your pain medicine. It is easy to become confused while on pain medicine. Writing the time can help you avoid overdose. Take other over-the-counter or prescription medicines only as told by your health care provider. Keeping yourself and others safe  While you are taking opioid pain medicine: Do not drive, use machinery, or power tools. Do not sign legal documents. Do not drink alcohol. Do not take sleeping pills. Do not supervise children by yourself. Do not do  activities that require climbing or being in high places. Do not go to a lake, river, ocean, spa, or swimming pool. Do not share your pain medicine with anyone. Keep pain medicine in a locked cabinet or in a secure area where pets and children cannot reach it. Stopping your use of opioids If you have been taking opioid medicine for more than a few weeks, you may need to slowly decrease (taper) how much you take until you stop completely. Tapering your use of opioids can decrease your risk of symptoms of withdrawal, such as: Pain and cramping in the abdomen. Nausea. Sweating. Sleepiness. Restlessness. Uncontrollable shaking (tremors). Cravings for the medicine. Do not attempt to taper your use of opioids on your own. Talk with your health care provider about how to do this. Your health care provider may prescribe a step-down schedule based on how much medicine you are taking and how long you have been taking it. Getting rid of leftover pills Do not save any leftover pills. Get rid of leftover pills safely by: Taking the medicine to a prescription take-back program. This is usually offered by the county or law enforcement. Bringing them to a pharmacy that has a drug disposal container. Flushing them down the toilet. Check the label or package insert of your medicine to see whether this is safe to do. Throwing them out in the trash. Check the label or package insert of your medicine to see whether this is safe to do. If it is safe to throw it out, remove the medicine from the original container, put it into a sealable bag or container, and mix it with used coffee grounds, food scraps, dirt, or cat litter before putting it in the trash. Follow these instructions at home: Activity Do exercises as told by your health care provider. Avoid activities that make your pain worse. Return to your normal activities as told by your health care provider. Ask your health care provider what activities are  safe for you.  General instructions You may need to take these actions to prevent or treat constipation: Drink enough fluid to keep your urine pale yellow. Take over-the-counter or prescription medicines. Eat foods that are high in fiber, such as beans, whole grains, and fresh fruits and vegetables. Limit foods that are high in fat and processed sugars, such as fried or sweet foods. Keep all follow-up visits. This is important. Where to find support If you have been taking opioids for a long time, you may benefit from receiving support for quitting from a local support group or counselor. Ask your health care provider for a referral to these resources in your area. Where to find more information Centers for Disease Control and Prevention (CDC): FootballExhibition.com.br [place] Food and Drug Administration (FDA): PumpkinSearch.com.ee Get help right away if: You may have taken too much of an opioid (overdosed). Common symptoms of an overdose: Your breathing is slower or more shallow than normal. You have a very slow heartbeat (pulse). You have slurred speech. You have nausea and vomiting. Your pupils become very small. You have other potential symptoms: You are very confused. You faint or feel like you will faint. You have cold, clammy skin. You have blue lips or fingernails. You have thoughts of harming yourself or harming others. These symptoms may represent a serious problem that is an emergency. Do not wait to see if the symptoms will go away. Get medical help right away. Call your local emergency services (911 in the [place] Do not drive yourself to the hospital.  If you ever feel like you may hurt yourself or others, or have thoughts about taking your own life, get help right away. Go to your nearest emergency department or: Call your local emergency services (911 in the [place] Call the [facility] ((802) 290-1727 in the [place] Call a suicide crisis helpline, such as the National Suicide  Prevention Lifeline at 4097798240 or 988 in the [place] This is open 24 hours a day in the [place] Text the Crisis Text Line at 223-490-3876 (in the [place] Summary Opioid medicines can help you manage moderate to severe pain for a short period of time. A pain treatment plan is an agreement between you and your health care provider. Discuss the goals of your treatment, including how much pain you might expect to have and how you will manage the pain. If you think that you or someone else may have taken too much of an opioid, get medical help right away. This information is not intended to replace advice given to you by your health care provider. Make sure you discuss any questions you have with your health care provider. Document Revised: 02/15/2021 Document Reviewed: 11/02/2020 Elsevier Patient Education  2022 [employer] Inc[employer]  Document Released: 05/19/2009 Document Revised: 12/29/2015 Document Reviewed: 08/27/2014 Elsevier Interactive Patient Education  2017 [employer].

## 2021-10-11 NOTE — Progress Notes (Addendum)
Subjective:   Kristie Jones is a 66 y.o. female who presents for an Initial Medicare Annual Wellness Visit.  Review of Systems    No ROS.  Medicare Wellness Virtual Visit.  Visual/audio telehealth visit, UTA vital signs.   See social history for additional risk factors.   Cardiac Risk Factors include: advanced age (>18men, >65 women)     Objective:    Today's Vitals   10/11/21 0912  Weight: 149 lb (67.6 kg)  Height: 5' (1.524 m)   Body mass index is 29.1 kg/m.  Advanced Directives 10/11/2021 05/25/2021 12/06/2020 11/22/2020 05/09/2016 06/20/2015 06/20/2015  Does Patient Have a Medical Advance Directive? No No No No No No No  Would patient like information on creating a medical advance directive? No - Patient declined - No - Patient declined - Yes - Educational materials given Yes - Spiritual care consult ordered -    Current Medications (verified) Outpatient Encounter Medications as of 10/11/2021  Medication Sig   atorvastatin (LIPITOR) 40 MG tablet TAKE 1 TABLET BY MOUTH ONCE DAILY.   blood glucose meter kit and supplies KIT Dispense based on patient and insurance preference. Use up to four times daily as directed.   budesonide (ENTOCORT EC) 3 MG 24 hr capsule Take 3 capsules (9 mg total) by mouth every morning.   diazepam (VALIUM) 10 MG tablet TAKE 1 TABLET AT BEDTIME AS NEEDED FOR ANXIETY   DULoxetine (CYMBALTA) 30 MG capsule TAKE 1 TABLET BY MOUTH ONCE DAILY. (TO BE TAKEN WITH 60MG  TABLET)   DULoxetine (CYMBALTA) 60 MG capsule TAKE (1) CAPSULE BY MOUTH ONCE DAILY.   furosemide (LASIX) 20 MG tablet TAKE 1 TABLET BY MOUTH TWICE DAILY   ondansetron (ZOFRAN-ODT) 4 MG disintegrating tablet DISSOLVE 1 TABLET BY MOUTH EVERY 8 HOURS AS NEEDED FOR NAUSEA & VOMITING. *MAY TAKE 2 AT A TIME IF NEEDED*   Semaglutide,0.25 or 0.5MG /DOS, (OZEMPIC, 0.25 OR 0.5 MG/DOSE,) 2 MG/1.5ML SOPN Inject 0.5 mg into the skin once a week.   spironolactone (ALDACTONE) 100 MG tablet TAKE 1 TABLET BY MOUTH  ONCE DAILY.   traMADol (ULTRAM) 50 MG tablet TAKE (2) TABLETS BY MOUTH EVERY SIX HOURS AS NEEDED FOR PAIN.   No facility-administered encounter medications on file as of 10/11/2021.    Allergies (verified) Ciprofloxacin, Penicillins, Sulfamethoxazole-trimethoprim, Cefdinir, Iodinated contrast media, Methocarbamol, and Levaquin [levofloxacin]   History: Past Medical History:  Diagnosis Date   Allergy    Arthritis    Cancer (HCC)    MULTIPLE SQUAMOUS CELL-WRIST, BUTTOCKS, ABDOMEN, LIP   Chicken pox    Complication of anesthesia    PT GETS VERY ANXIOUS PRIOR TO ANESTHESIA AND WILL START JERKING MASK OFF   Depression    Diabetes mellitus without complication (HCC)    Endometriosis    Headache    Lower extremity edema    Past Surgical History:  Procedure Laterality Date   ABDOMINAL HYSTERECTOMY     COLONOSCOPY WITH PROPOFOL N/A 05/25/2021   Procedure: COLONOSCOPY WITH PROPOFOL;  Surgeon: Midge Minium, MD;  Location: [facility];  Service: Endoscopy;  Laterality: N/A;   CYSTOSCOPY     X3   DIAGNOSTIC LAPAROSCOPY     ESOPHAGOGASTRODUODENOSCOPY (EGD) WITH PROPOFOL N/A 05/25/2021   Procedure: ESOPHAGOGASTRODUODENOSCOPY (EGD) WITH PROPOFOL;  Surgeon: Midge Minium, MD;  Location: [facility] END[facility];  Service: Endoscopy;  Laterality: N/A;   MOHS SURGERY     PAROTIDECTOMY Left 06/20/2015   Procedure: PAROTIDECTOMY;  Surgeon: Vernie Murders, MD;  Location: [facility] ORS;  Service: ENT;  Laterality: Left;   Family History  Problem Relation Age of Onset   Arthritis Mother    Hypertension Mother    Pancreatic cancer Maternal Aunt    Bone cancer Maternal Aunt    Diabetes Mellitus I Maternal Uncle        CAD    Vasculitis Maternal Aunt        Wegener's    Social History   Socioeconomic History   Marital status: Married    Spouse name: Not on file   Number of children: Not on file   Years of education: Not on file   Highest education level: Not on file  Occupational History   Occupation:  unemployed    Comment: lost job 04/03/2018  Tobacco Use   Smoking status: Every Day    Packs/day: 0.50    Years: 20.00    Pack years: 10.00    Types: Cigarettes   Smokeless tobacco: Never  Vaping Use   Vaping Use: Former  Substance and Sexual Activity   Alcohol use: Yes    Comment: Rarely   Drug use: No   Sexual activity: Yes  Other Topics Concern   Not on file  Social History Narrative   Not on file   Social Determinants of Health   Financial Resource Strain: Medium Risk   Difficulty of Paying Living Expenses: Somewhat hard  Food Insecurity: No Food Insecurity   Worried About Programme researcher, broadcasting/film/video in the Last Year: Never true   Ran Out of Food in the Last Year: Never true  Transportation Needs: No Transportation Needs   Lack of Transportation (Medical): No   Lack of Transportation (Non-Medical): No  Physical Activity: Insufficiently Active   Days of Exercise per Week: 3 days   Minutes of Exercise per Session: 30 min  Stress: Stress Concern Present   Feeling of Stress : Rather much  Social Connections: Socially Integrated   Frequency of Communication with Friends and Family: More than three times a week   Frequency of Social Gatherings with Friends and Family: Once a week   Attends Religious Services: More than 4 times per year   Active Member of [employer] or Organizations: Yes   Attends Engineer, structural: More than 4 times per year   Marital Status: Married    Tobacco Counseling Ready to quit: Not Answered Counseling given: Not Answered   Clinical Intake:  Pre-visit preparation completed: Yes        Diabetes: Yes (Followed by PCP)  How often do you need to have someone help you when you read instructions, pamphlets, or other written materials from your doctor or pharmacy?: 1 - Never  Nutrition Risk Assessment: Does the patient have any non-healing wounds?  No   Diabetes: Is the patient diabetic?  Yes If diabetic, was a CBG obtained today?  Yes  , FBS 160 How often do you monitor your CBG's? 2-3 times.   Diabetic eye exam: due. Agrees to schedule.    Interpreter Needed?: No    Activities of Daily Living In your present state of health, do you have any difficulty performing the following activities: 10/11/2021  Hearing? N  Vision? N  Difficulty concentrating or making decisions? N  Walking or climbing stairs? N  Dressing or bathing? N  Doing errands, shopping? N  Preparing Food and eating ? N  Using the Toilet? N  In the past six months, have you accidently leaked urine? N  Do you have problems with loss of bowel  control? N  Managing your Medications? N  Managing your Finances? N  Housekeeping or managing your Housekeeping? N  Some recent data might be hidden    Patient Care Team: Sherlene Shams, MD as PCP - General (Internal Medicine)  Indicate any recent Medical Services you may have received from other than [facility] providers in the past year (date may be approximate).     Assessment:   This is a routine wellness examination for [place].  Virtual Visit via Telephone Note  I connected with  Kristie Jones on 10/11/21 at  9:00 AM EST by telephone and verified that I am speaking with the correct person using two identifiers.  Persons participating in the virtual visit: patient/Nurse Health Advisor   I discussed the limitations, risks, security and privacy concerns of performing an evaluation and management service by telephone and the availability of in person appointments. The patient expressed understanding and agreed to proceed.  Interactive audio and video telecommunications were attempted between this nurse and patient, however failed, due to patient having technical difficulties OR patient did not have access to video capability.  We continued and completed visit with audio only.  Some vital signs may be absent or patient reported.   Hearing/Vision screen Hearing Screening - Comments:: Patient is able to hear  conversational tones without difficulty.  No issues reported. Vision Screening - Comments:: Followed by [facility], [place] Wears corrective lenses They have seen their ophthalmologist in the last 12 months.    Dietary issues and exercise activities discussed: Current Exercise Habits: Home exercise routine, Type of exercise: walking, Intensity: Mild Low carb diet  Good water intake   Goals Addressed               This Visit's Progress     Patient Stated     Follow up with PCP as needed (pt-stated)        Take all medications as prescribed Stop smoking       Depression Screen PHQ 2/9 Scores 10/11/2021 08/30/2021 12/02/2020 08/15/2020 12/02/2019 06/23/2019 05/18/2019  PHQ - 2 Score 4 4 4 6 5 4 6   PHQ- 9 Score 8 8 16 26 20 12 24   Exception Documentation - - - - - - -    Fall Risk Fall Risk  10/11/2021 08/30/2021 12/02/2020 08/15/2020 12/02/2019  Falls in the past year? 0 0 1 0 1  Number falls in past yr: 0 - 1 0 1  Injury with Fall? - - 0 0 1  Risk for fall due to : - No Fall Risks - - -  Follow up Falls evaluation completed Falls evaluation completed Falls evaluation completed Falls evaluation completed Falls evaluation completed    FALL RISK PREVENTION PERTAINING TO THE HOME:  Home free of loose throw rugs in walkways, pet beds, electrical cords, etc? Yes  Adequate lighting in your home to reduce risk of falls? Yes   ASSISTIVE DEVICES UTILIZED TO PREVENT FALLS:  Life alert? No  Use of a cane, walker or w/c? No   TIMED UP AND GO: Was the test performed? No .   Cognitive Function:  Patient is alert and oriented x3.       Immunizations Immunization History  Administered Date(s) Administered   Influenza,inj,Quad PF,6+ Mos 08/15/2020, 08/30/2021   Influenza-Unspecified 04/26/2014, 05/29/2015, 06/05/2016, 05/31/2017   Janssen (J&J) SARS-COV-2 Vaccination 10/23/2019   Pneumococcal Conjugate-13 08/15/2020   Pneumococcal Polysaccharide-23 01/22/2014   Tdap  10/09/2012   Shingrix Completed?: No.  Education has been provided regarding the importance of this vaccine. Patient has been advised to call insurance company to determine out of pocket expense if they have not yet received this vaccine. Advised may also receive vaccine at local pharmacy or Health Dept. Verbalized acceptance and understanding.  Screening Tests Health Maintenance  Topic Date Due   MAMMOGRAM  Never done   OPHTHALMOLOGY EXAM  11/30/2021 (Originally 06/10/1966)   Zoster Vaccines- Shingrix (1 of 2) 01/11/2022 (Originally 06/10/1975)   HEMOGLOBIN A1C  02/25/2022   URINE MICROALBUMIN  08/28/2022   FOOT EXAM  08/30/2022   TETANUS/TDAP  10/10/2022   COLONOSCOPY (Pts 45-6yrs Insurance coverage will need to be confirmed)  05/26/2031   INFLUENZA VACCINE  Completed   Hepatitis C Screening  Completed   HPV VACCINES  Aged Out   COVID-19 Vaccine  Discontinued   HIV Screening  Discontinued   Health Maintenance Health Maintenance Due  Topic Date Due   MAMMOGRAM  Never done   Mammogram- ordered. Agrees to schedule with [facility]. Number provided [phone].   Lung Cancer Screening: completed 12/02/20.    Vision Screening: Recommended annual ophthalmology exams for early detection of glaucoma and other disorders of the eye.  Dental Screening: Recommended annual dental exams for proper oral hygiene  Community Resource Referral / Chronic Care Management: CRR required this visit?  No   CCM required this visit?  No      Plan:   Keep all routine maintenance appointments.   Patient reports taking Ozempic first two doses incorrectly. Did not remove cap appropriately and medication wasted. First dose, correct injection given 10/11/21.   I have personally reviewed and noted the following in the patient's chart:   Medical and social history Use of alcohol, tobacco or illicit drugs  Current medications and supplements including opioid prescriptions. Patient is  currently taking opioid prescriptions. Information provided to patient regarding non-opioid alternatives. Patient advised to discuss non-opioid treatment plan with their provider. Taking Tramadol. Followed by PCP.  Functional ability and status Nutritional status Physical activity Advanced directives List of other physicians Hospitalizations, surgeries, and ER visits in previous 12 months Vitals Screenings to include cognitive, depression, and falls Referrals and appointments  In addition, I have reviewed and discussed with patient certain preventive protocols, quality metrics, and best practice recommendations. A written personalized care plan for preventive services as well as general preventive health recommendations were provided to patient.     OBrien-Blaney, Saverio Kader L, LPN   11/10/8293     I have reviewed the above information and agree with above.   Duncan Dull, MD

## 2021-10-16 ENCOUNTER — Other Ambulatory Visit: Payer: Self-pay | Admitting: Internal Medicine

## 2021-10-16 DIAGNOSIS — Z20822 Contact with and (suspected) exposure to covid-19: Secondary | ICD-10-CM

## 2021-10-17 ENCOUNTER — Telehealth: Payer: Self-pay | Admitting: Internal Medicine

## 2021-10-17 DIAGNOSIS — Z20822 Contact with and (suspected) exposure to covid-19: Secondary | ICD-10-CM

## 2021-10-17 NOTE — Telephone Encounter (Signed)
Patient is having side effects from her Ozempic. She is throwing up and sick to her stomach. She would like her more of the ondansetron (ZOFRAN-ODT) 4 MG disintegrating tablet filled and phenergan. ?

## 2021-10-18 MED ORDER — PROMETHAZINE HCL 12.5 MG PO TABS: 12.5000 mg | ORAL_TABLET | Freq: Every evening | ORAL | 0 refills | Status: DC | PRN

## 2021-10-18 MED ORDER — ONDANSETRON 4 MG PO TBDP: 4.0000 mg | ORAL_TABLET | Freq: Three times a day (TID) | ORAL | 11 refills | Status: DC | PRN

## 2021-10-18 NOTE — Addendum Note (Signed)
Addended by: Crecencio Mc on: 10/18/2021 05:18 PM ? ? Modules accepted: Orders ? ?

## 2021-10-18 NOTE — Telephone Encounter (Signed)
Patient says the Ozempic is making her very nauseated and having emesis, she accidentally took 0.5 mg instead of 0.25 mg as her starting dos elast week aftr she had tried injecting twice with the second cap on  which mean't patient did not receive those injections so when she realized the cap was on she decided to inject 0.5 mg because her sugars were high. Patient does her injections on Wednesday so she took the correct dose today of 0.25 mg she is asking for a larger quantity then 20 on Zofran and asking phenergan to take for night time nausea.  ?

## 2021-10-18 NOTE — Telephone Encounter (Signed)
Will do, for one month , but if the nausea does not resolve  by then,  Ozempic will not be a good choice of medication for her  ?

## 2021-10-18 NOTE — Telephone Encounter (Signed)
Patient called and said no one called her yesterday. She would like a call back today. ?

## 2021-10-19 NOTE — Telephone Encounter (Signed)
Spoke with pt to let her know that the medication was sent in for one month. However if she is still having the same symptoms in a month then the ozempic my not be the right medication for the pt. Pt gave a verbal understanding.  ?

## 2021-10-20 ENCOUNTER — Other Ambulatory Visit: Payer: Self-pay | Admitting: Internal Medicine

## 2021-10-20 DIAGNOSIS — F32A Depression, unspecified: Secondary | ICD-10-CM

## 2021-10-23 NOTE — Telephone Encounter (Signed)
Pt called in requesting for refill on medications (DULoxetine (CYMBALTA) 30 MG capsule), (DULoxetine (CYMBALTA) 60 MG capsule), (furosemide (LASIX) 20 MG tablet) and (spironolactone (ALDACTONE) 100 MG tablet)... Pt stated that pharmacy sent over refill requesting... Pt stated that pharmacy advise her that they didn't hear back from office... Pt requesting callback...  ?

## 2021-10-25 NOTE — Telephone Encounter (Signed)
Pt called about an update of her ozempic being delivered ?

## 2021-10-25 NOTE — Telephone Encounter (Signed)
Spoke with pt and she wanted to know if we had received her ozempic in the office yet. I let her know that we have not and that she would need to call [place] to find out if it has been shipped yet or not. Pt gave a verbal understanding.  ?

## 2021-10-27 NOTE — Telephone Encounter (Signed)
Pt want an update for her ozempic medication and pt said she did not speak to the cma on the 22nd. Pt would like a call back. ?

## 2021-10-30 ENCOUNTER — Other Ambulatory Visit: Payer: Self-pay

## 2021-10-30 ENCOUNTER — Telehealth: Payer: Self-pay | Admitting: Internal Medicine

## 2021-10-30 DIAGNOSIS — E1140 Type 2 diabetes mellitus with diabetic neuropathy, unspecified: Secondary | ICD-10-CM

## 2021-10-30 MED ORDER — OZEMPIC (0.25 OR 0.5 MG/DOSE) 2 MG/1.5ML ~~LOC~~ SOPN: 0.5000 mg | PEN_INJECTOR | SUBCUTANEOUS | 1 refills | Status: DC

## 2021-10-30 NOTE — Telephone Encounter (Signed)
Patient called Kristie Jones on 10/27/2021 and Ozempic has not been shipped, and had no idea when they would get some in. Patient would like to know if the office could give her a sample until her Ozempic is available. Patient will need by this Wednesday, that is when she is do for her next injection. ?

## 2021-10-30 NOTE — Telephone Encounter (Signed)
Rx sent 

## 2021-10-30 NOTE — Telephone Encounter (Signed)
Patient called back and said that [employer] gave her a 30 day supply voucher that needs to be used within 2 days. Patient needs a prescription from Dr Derrel Nip for a 30 day supply of Ozempic sent to [facility] in [place], [place]. ?

## 2021-10-31 ENCOUNTER — Other Ambulatory Visit: Payer: Self-pay

## 2021-10-31 DIAGNOSIS — E1140 Type 2 diabetes mellitus with diabetic neuropathy, unspecified: Secondary | ICD-10-CM

## 2021-10-31 MED ORDER — OZEMPIC (0.25 OR 0.5 MG/DOSE) 2 MG/1.5ML ~~LOC~~ SOPN: 0.5000 mg | PEN_INJECTOR | SUBCUTANEOUS | 1 refills | Status: DC

## 2021-10-31 NOTE — Telephone Encounter (Signed)
I spoke with [facility] in [place] & they have now received script. Pt is there & they are processing her voucher now so patient can receive Ozempic.  ?

## 2021-10-31 NOTE — Telephone Encounter (Signed)
Pt calling in to get up date on medication (Semaglutide,0.25 or 0.'5MG'$ /DOS, (OZEMPIC, 0.25 OR 0.5 MG/DOSE,) 2 MG/1.5ML SOPN)... Medication wasn't sent to pt pharmacy... Pt stated that voucher expires today... Pt requesting callback  ?

## 2021-10-31 NOTE — Telephone Encounter (Signed)
Joelene Millin from [facility] called stating they have not received the ozempic prescription yet  ?[phone] ?

## 2021-11-03 ENCOUNTER — Telehealth: Payer: Self-pay | Admitting: Internal Medicine

## 2021-11-03 NOTE — Telephone Encounter (Signed)
Tried to reach patient by phone to advise Ozempic ready for pickup , medication labeled and in sample frig POD B. ?

## 2021-11-09 ENCOUNTER — Other Ambulatory Visit: Payer: Self-pay | Admitting: Internal Medicine

## 2021-11-09 DIAGNOSIS — Z760 Encounter for issue of repeat prescription: Secondary | ICD-10-CM

## 2021-11-10 ENCOUNTER — Telehealth: Payer: Medicare Other

## 2021-11-27 NOTE — Telephone Encounter (Signed)
Patient returned office phone call. She will pick up medication tomorrow at lab appointment. ?

## 2021-11-28 ENCOUNTER — Other Ambulatory Visit (INDEPENDENT_AMBULATORY_CARE_PROVIDER_SITE_OTHER): Payer: Medicare Other

## 2021-11-28 DIAGNOSIS — E876 Hypokalemia: Secondary | ICD-10-CM

## 2021-11-28 DIAGNOSIS — D751 Secondary polycythemia: Secondary | ICD-10-CM | POA: Diagnosis not present

## 2021-11-28 DIAGNOSIS — E785 Hyperlipidemia, unspecified: Secondary | ICD-10-CM | POA: Diagnosis not present

## 2021-11-28 DIAGNOSIS — E1140 Type 2 diabetes mellitus with diabetic neuropathy, unspecified: Secondary | ICD-10-CM

## 2021-11-28 LAB — CBC WITH DIFFERENTIAL/PLATELET
Basophils Absolute: 0.1 10*3/uL (ref 0.0–0.1)
Basophils Relative: 0.7 % (ref 0.0–3.0)
Eosinophils Absolute: 0 10*3/uL (ref 0.0–0.7)
Eosinophils Relative: 0.2 % (ref 0.0–5.0)
HCT: 47.1 % — ABNORMAL HIGH (ref 36.0–46.0)
Hemoglobin: 16 g/dL — ABNORMAL HIGH (ref 12.0–15.0)
Lymphocytes Relative: 22.1 % (ref 12.0–46.0)
Lymphs Abs: 2 10*3/uL (ref 0.7–4.0)
MCHC: 34 g/dL (ref 30.0–36.0)
MCV: 96.7 fl (ref 78.0–100.0)
Monocytes Absolute: 0.5 10*3/uL (ref 0.1–1.0)
Monocytes Relative: 5.5 % (ref 3.0–12.0)
Neutro Abs: 6.4 10*3/uL (ref 1.4–7.7)
Neutrophils Relative %: 71.5 % (ref 43.0–77.0)
Platelets: 220 10*3/uL (ref 150.0–400.0)
RBC: 4.87 Mil/uL (ref 3.87–5.11)
RDW: 13.7 % (ref 11.5–15.5)
WBC: 9 10*3/uL (ref 4.0–10.5)

## 2021-11-28 LAB — LIPID PANEL
Cholesterol: 183 mg/dL (ref 0–200)
HDL: 101.7 mg/dL (ref >39.00)
LDL Cholesterol: 65 mg/dL (ref 0–99)
NonHDL: 81.13
Total CHOL/HDL Ratio: 2
Triglycerides: 81 mg/dL (ref 0.0–149.0)
VLDL: 16.2 mg/dL (ref 0.0–40.0)

## 2021-11-28 LAB — COMPREHENSIVE METABOLIC PANEL
ALT: 52 U/L — ABNORMAL HIGH (ref 0–35)
AST: 34 U/L (ref 0–37)
Albumin: 4.3 g/dL (ref 3.5–5.2)
Alkaline Phosphatase: 115 U/L (ref 39–117)
BUN: 13 mg/dL (ref 6–23)
CO2: 35 mEq/L — ABNORMAL HIGH (ref 19–32)
Calcium: 9.7 mg/dL (ref 8.4–10.5)
Chloride: 91 mEq/L — ABNORMAL LOW (ref 96–112)
Creatinine, Ser: 0.87 mg/dL (ref 0.40–1.20)
GFR: 70.88 mL/min (ref >60.00)
Glucose, Bld: 91 mg/dL (ref 70–99)
Potassium: 3.4 mEq/L — ABNORMAL LOW (ref 3.5–5.1)
Sodium: 134 mEq/L — ABNORMAL LOW (ref 135–145)
Total Bilirubin: 0.7 mg/dL (ref 0.2–1.2)
Total Protein: 6.8 g/dL (ref 6.0–8.3)

## 2021-11-28 LAB — HEMOGLOBIN A1C: Hgb A1c MFr Bld: 6.9 % — ABNORMAL HIGH (ref 4.6–6.5)

## 2021-11-30 MED ORDER — POTASSIUM CHLORIDE CRYS ER 20 MEQ PO TBCR: 20.0000 meq | EXTENDED_RELEASE_TABLET | Freq: Every day | ORAL | 0 refills | Status: DC

## 2021-11-30 NOTE — Addendum Note (Signed)
Addended by: Crecencio Mc on: 11/30/2021 03:16 PM ? ? Modules accepted: Orders ? ?

## 2021-12-01 ENCOUNTER — Ambulatory Visit: Payer: Medicare Other | Admitting: Internal Medicine

## 2021-12-04 ENCOUNTER — Inpatient Hospital Stay: Payer: Medicare Other

## 2021-12-04 ENCOUNTER — Inpatient Hospital Stay: Payer: Medicare Other | Admitting: Oncology

## 2021-12-12 ENCOUNTER — Other Ambulatory Visit: Payer: Self-pay | Admitting: Internal Medicine

## 2021-12-12 NOTE — Telephone Encounter (Signed)
Refilled: 07/10/2021 ?Last OV: 08/30/2021 ?Next OV: 01/03/2022 ?

## 2021-12-27 ENCOUNTER — Inpatient Hospital Stay: Payer: Medicare Other | Attending: Oncology | Admitting: Oncology

## 2021-12-27 ENCOUNTER — Other Ambulatory Visit: Payer: Self-pay

## 2021-12-27 ENCOUNTER — Encounter: Payer: Self-pay | Admitting: Oncology

## 2021-12-27 ENCOUNTER — Inpatient Hospital Stay: Payer: Medicare Other

## 2021-12-27 VITALS — Wt 133.0 lb

## 2021-12-27 DIAGNOSIS — E785 Hyperlipidemia, unspecified: Secondary | ICD-10-CM | POA: Diagnosis not present

## 2021-12-27 DIAGNOSIS — D751 Secondary polycythemia: Secondary | ICD-10-CM

## 2021-12-27 DIAGNOSIS — F17210 Nicotine dependence, cigarettes, uncomplicated: Secondary | ICD-10-CM | POA: Insufficient documentation

## 2021-12-27 DIAGNOSIS — E119 Type 2 diabetes mellitus without complications: Secondary | ICD-10-CM | POA: Insufficient documentation

## 2021-12-27 DIAGNOSIS — Z79899 Other long term (current) drug therapy: Secondary | ICD-10-CM | POA: Insufficient documentation

## 2021-12-27 LAB — CBC WITH DIFFERENTIAL/PLATELET
Abs Immature Granulocytes: 0.07 10*3/uL (ref 0.00–0.07)
Basophils Absolute: 0.1 10*3/uL (ref 0.0–0.1)
Basophils Relative: 0 %
Eosinophils Absolute: 0 10*3/uL (ref 0.0–0.5)
Eosinophils Relative: 0 %
HCT: 48.6 % — ABNORMAL HIGH (ref 36.0–46.0)
Hemoglobin: 16.8 g/dL — ABNORMAL HIGH (ref 12.0–15.0)
Immature Granulocytes: 1 %
Lymphocytes Relative: 14 %
Lymphs Abs: 1.9 10*3/uL (ref 0.7–4.0)
MCH: 32.3 pg (ref 26.0–34.0)
MCHC: 34.6 g/dL (ref 30.0–36.0)
MCV: 93.5 fL (ref 80.0–100.0)
Monocytes Absolute: 0.9 10*3/uL (ref 0.1–1.0)
Monocytes Relative: 6 %
Neutro Abs: 11 10*3/uL — ABNORMAL HIGH (ref 1.7–7.7)
Neutrophils Relative %: 79 %
Platelets: 267 10*3/uL (ref 150–400)
RBC: 5.2 MIL/uL — ABNORMAL HIGH (ref 3.87–5.11)
RDW: 12.5 % (ref 11.5–15.5)
WBC: 13.9 10*3/uL — ABNORMAL HIGH (ref 4.0–10.5)
nRBC: 0 % (ref 0.0–0.2)

## 2021-12-27 NOTE — Progress Notes (Signed)
I connected with Kristie Jones on 12/27/21 at  3:15 PM EDT by video enabled telemedicine visit and verified that I am speaking with the correct person using two identifiers.   I discussed the limitations, risks, security and privacy concerns of performing an evaluation and management service by telemedicine and the availability of in-person appointments. I also discussed with the patient that there may be a patient responsible charge related to this service. The patient expressed understanding and agreed to proceed.  Other persons participating in the visit and their role in the encounter:  none  Patient's location:  home Provider's location:  work  Risk analyst Complaint: Routine follow-up of polycythemia  History of present illness: Patient is a 66 year old female who has a longstanding history of smoking, type 2 diabetes, hyperlipidemia who has been referred for polycythemia.  Her most recent CBC from 08/15/2020 showed an H&H of 17.9/51.9 with a normal white count and a platelet count.   Results of blood work from 12/02/2020 and 11/22/2020 were as follows: CBC showed white count of 13.4, H&H of 18.2/52.7 and a platelet count of 236.  JAK2, CALR and MPL mutation negative.  Exon 12 negative.  EPO levels normal.  Urinalysis showed no hematuria.    Interval history patient is currently on Ozempic and has lost considerable weight and is down to 133 pounds.  She is also trying to cut down her smoking.   Review of Systems  Constitutional:  Negative for chills, fever, malaise/fatigue and weight loss.  HENT:  Negative for congestion, ear discharge and nosebleeds.   Eyes:  Negative for blurred vision.  Respiratory:  Negative for cough, hemoptysis, sputum production, shortness of breath and wheezing.   Cardiovascular:  Negative for chest pain, palpitations, orthopnea and claudication.  Gastrointestinal:  Negative for abdominal pain, blood in stool, constipation, diarrhea, heartburn, melena, nausea and  vomiting.  Genitourinary:  Negative for dysuria, flank pain, frequency, hematuria and urgency.  Musculoskeletal:  Negative for back pain, joint pain and myalgias.  Skin:  Negative for rash.  Neurological:  Negative for dizziness, tingling, focal weakness, seizures, weakness and headaches.  Endo/Heme/Allergies:  Does not bruise/bleed easily.  Psychiatric/Behavioral:  Negative for depression and suicidal ideas. The patient does not have insomnia.    Allergies  Allergen Reactions   Ciprofloxacin Rash   Penicillins Rash   Sulfamethoxazole-Trimethoprim Rash    Other Reaction: Other reaction   Cefdinir Other (See Comments)    BLACK STOOLS/YEAST INFECTION   Iodinated Contrast Media Other (See Comments)    MIGRAINE   Methocarbamol    Levaquin [Levofloxacin] Rash    Past Medical History:  Diagnosis Date   Allergy    Arthritis    Cancer ([place])    MULTIPLE SQUAMOUS CELL-WRIST, BUTTOCKS, ABDOMEN, LIP   Chicken pox    Complication of anesthesia    PT GETS VERY ANXIOUS PRIOR TO ANESTHESIA AND WILL START JERKING MASK OFF   Depression    Diabetes mellitus without complication ([place])    Endometriosis    Headache    Lower extremity edema     Past Surgical History:  Procedure Laterality Date   ABDOMINAL HYSTERECTOMY     COLONOSCOPY WITH PROPOFOL N/A 05/25/2021   Procedure: COLONOSCOPY WITH PROPOFOL;  Surgeon: Lucilla Lame, MD;  Location: [facility] ENDOSCOPY;  Service: Endoscopy;  Laterality: N/A;   CYSTOSCOPY     X3   DIAGNOSTIC LAPAROSCOPY     ESOPHAGOGASTRODUODENOSCOPY (EGD) WITH PROPOFOL N/A 05/25/2021   Procedure: ESOPHAGOGASTRODUODENOSCOPY (EGD) WITH PROPOFOL;  Surgeon: Lucilla Lame,  MD;  Location: [facility] ENDOSCOPY;  Service: Endoscopy;  Laterality: N/A;   MOHS SURGERY     PAROTIDECTOMY Left 06/20/2015   Procedure: PAROTIDECTOMY;  Surgeon: Margaretha Sheffield, MD;  Location: [facility] ORS;  Service: ENT;  Laterality: Left;    Social History   Socioeconomic History   Marital status: Married     Spouse name: Not on file   Number of children: Not on file   Years of education: Not on file   Highest education level: Not on file  Occupational History   Occupation: unemployed    Comment: lost job 04/03/2018  Tobacco Use   Smoking status: Every Day    Packs/day: 0.50    Years: 20.00    Pack years: 10.00    Types: Cigarettes   Smokeless tobacco: Never  Vaping Use   Vaping Use: Former  Substance and Sexual Activity   Alcohol use: Yes    Comment: Rarely   Drug use: No   Sexual activity: Yes  Other Topics Concern   Not on file  Social History Narrative   Not on file   Social Determinants of Health   Financial Resource Strain: Medium Risk   Difficulty of Paying Living Expenses: Somewhat hard  Food Insecurity: No Food Insecurity   Worried About Charity fundraiser in the Last Year: Never true   Ran Out of Food in the Last Year: Never true  Transportation Needs: No Transportation Needs   Lack of Transportation (Medical): No   Lack of Transportation (Non-Medical): No  Physical Activity: Insufficiently Active   Days of Exercise per Week: 3 days   Minutes of Exercise per Session: 30 min  Stress: Not on file  Social Connections: Socially Integrated   Frequency of Communication with Friends and Family: More than three times a week   Frequency of Social Gatherings with Friends and Family: Once a week   Attends Religious Services: More than 4 times per year   Active Member of [employer] or Organizations: Yes   Attends Music therapist: More than 4 times per year   Marital Status: Married  Human resources officer Violence: Not At Risk   Fear of Current or Ex-Partner: No   Emotionally Abused: No   Physically Abused: No   Sexually Abused: No    Family History  Problem Relation Age of Onset   Arthritis Mother    Hypertension Mother    Pancreatic cancer Maternal Aunt    Bone cancer Maternal Aunt    Diabetes Mellitus I Maternal Uncle        CAD    Vasculitis Maternal Aunt         Wegener's      Current Outpatient Medications:    atorvastatin (LIPITOR) 40 MG tablet, TAKE 1 TABLET BY MOUTH ONCE DAILY., Disp: 90 tablet, Rfl: 0   blood glucose meter kit and supplies KIT, Dispense based on patient and insurance preference. Use up to four times daily as directed., Disp: 1 each, Rfl: 11   budesonide (ENTOCORT EC) 3 MG 24 hr capsule, Take 3 capsules (9 mg total) by mouth every morning., Disp: 90 capsule, Rfl: 6   diazepam (VALIUM) 10 MG tablet, TAKE 1 TABLET AT BEDTIME AS NEEDED FOR ANXIETY, Disp: 30 tablet, Rfl: 0   DULoxetine (CYMBALTA) 30 MG capsule, TAKE 1 TABLET BY MOUTH ONCE DAILY. (TO BE TAKEN WITH 60MG TABLET), Disp: 90 capsule, Rfl: 1   DULoxetine (CYMBALTA) 60 MG capsule, TAKE (1) CAPSULE BY MOUTH ONCE DAILY., Disp: 90  capsule, Rfl: 0   furosemide (LASIX) 20 MG tablet, TAKE 1 TABLET BY MOUTH TWICE DAILY, Disp: 180 tablet, Rfl: 0   ondansetron (ZOFRAN-ODT) 4 MG disintegrating tablet, Take 1 tablet (4 mg total) by mouth every 8 (eight) hours as needed for nausea or vomiting., Disp: 30 tablet, Rfl: 11   promethazine (PHENERGAN) 12.5 MG tablet, Take 1 tablet (12.5 mg total) by mouth at bedtime as needed for nausea or vomiting., Disp: 20 tablet, Rfl: 0   Semaglutide,0.25 or 0.5MG/DOS, (OZEMPIC, 0.25 OR 0.5 MG/DOSE,) 2 MG/1.5ML SOPN, Inject 0.5 mg into the skin once a week., Disp: 1.5 mL, Rfl: 1   spironolactone (ALDACTONE) 100 MG tablet, TAKE 1 TABLET BY MOUTH ONCE DAILY., Disp: 90 tablet, Rfl: 0   traMADol (ULTRAM) 50 MG tablet, TAKE (2) TABLETS BY MOUTH EVERY SIX HOURS AS NEEDED FOR PAIN., Disp: 240 tablet, Rfl: 2  No results found.  No images are attached to the encounter.      Latest Ref Rng & Units 11/28/2021    8:40 AM  CMP  Glucose 70 - 99 mg/dL 91    BUN 6 - 23 mg/dL 13    Creatinine 0.40 - 1.20 mg/dL 0.87    Sodium 135 - 145 mEq/L 134    Potassium 3.5 - 5.1 mEq/L 3.4    Chloride 96 - 112 mEq/L 91    CO2 19 - 32 mEq/L 35    Calcium 8.4 - 10.5  mg/dL 9.7    Total Protein 6.0 - 8.3 g/dL 6.8    Total Bilirubin 0.2 - 1.2 mg/dL 0.7    Alkaline Phos 39 - 117 U/L 115    AST 0 - 37 U/L 34    ALT 0 - 35 U/L 52        Latest Ref Rng & Units 12/27/2021   11:04 AM  CBC  WBC 4.0 - 10.5 K/uL 13.9    Hemoglobin 12.0 - 15.0 g/dL 16.8    Hematocrit 36.0 - 46.0 % 48.6    Platelets 150 - 400 K/uL 267       Observation/objective: Appears in no acute distress over video visit today.  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  Hematocrit has remained less than 50-55 and therefore she does not require any phlebotomy at this time.  Given the stability of her counts I will plan to repeat CBC in 6 months in 1 year and see her back in 1 year  Follow-up instructions: As above  I discussed the assessment and treatment plan with the patient. The patient was provided an opportunity to ask questions and all were answered. The patient agreed with the plan and demonstrated an understanding of the instructions.   The patient was advised to call back or seek an in-person evaluation if the symptoms worsen or if the condition fails to improve as anticipated.    Visit Diagnosis: 1. Secondary polycythemia     Dr. Randa Evens, MD, MPH [facility] at [facility] Tel- [phone] 12/27/2021 4:19 PM

## 2021-12-27 NOTE — Progress Notes (Signed)
Pt states since starting [facility] in March she has been dealing with lots of nausea and vomiting.

## 2022-01-03 ENCOUNTER — Encounter: Payer: Self-pay | Admitting: Internal Medicine

## 2022-01-03 ENCOUNTER — Ambulatory Visit (INDEPENDENT_AMBULATORY_CARE_PROVIDER_SITE_OTHER): Payer: Medicare Other | Admitting: Internal Medicine

## 2022-01-03 VITALS — BP 112/78 | HR 131 | Temp 97.6°F | Ht 60.0 in | Wt 131.4 lb

## 2022-01-03 DIAGNOSIS — R000 Tachycardia, unspecified: Secondary | ICD-10-CM | POA: Diagnosis not present

## 2022-01-03 DIAGNOSIS — Z20822 Contact with and (suspected) exposure to covid-19: Secondary | ICD-10-CM

## 2022-01-03 DIAGNOSIS — F331 Major depressive disorder, recurrent, moderate: Secondary | ICD-10-CM

## 2022-01-03 DIAGNOSIS — E041 Nontoxic single thyroid nodule: Secondary | ICD-10-CM

## 2022-01-03 DIAGNOSIS — E876 Hypokalemia: Secondary | ICD-10-CM

## 2022-01-03 DIAGNOSIS — F321 Major depressive disorder, single episode, moderate: Secondary | ICD-10-CM

## 2022-01-03 DIAGNOSIS — E1140 Type 2 diabetes mellitus with diabetic neuropathy, unspecified: Secondary | ICD-10-CM

## 2022-01-03 DIAGNOSIS — R110 Nausea: Secondary | ICD-10-CM | POA: Diagnosis not present

## 2022-01-03 DIAGNOSIS — E871 Hypo-osmolality and hyponatremia: Secondary | ICD-10-CM

## 2022-01-03 DIAGNOSIS — N289 Disorder of kidney and ureter, unspecified: Secondary | ICD-10-CM

## 2022-01-03 MED ORDER — ONDANSETRON 4 MG PO TBDP: 4.0000 mg | ORAL_TABLET | Freq: Three times a day (TID) | ORAL | 11 refills | Status: DC | PRN

## 2022-01-03 MED ORDER — PROMETHAZINE HCL 12.5 MG PO TABS
12.5000 mg | ORAL_TABLET | Freq: Every evening | ORAL | 5 refills | Status: DC | PRN
Start: 2022-01-03 — End: 2023-04-10

## 2022-01-03 MED ORDER — ARIPIPRAZOLE 2 MG PO TABS: 2.0000 mg | ORAL_TABLET | Freq: Every day | ORAL | 2 refills | Status: DC

## 2022-01-03 NOTE — Progress Notes (Addendum)
Subjective:  Patient ID: Kristie Jones, female    DOB: Dec 20, 1955  Age: 66 y.o. MRN: [id number]  CC: The primary encounter diagnosis was Nausea. Diagnoses of Tachycardia, unspecified, Major depressive disorder, single episode, moderate with anxious distress ([place]), Type 2 diabetes mellitus with diabetic neuropathy, without long-term current use of insulin ([place]), Hyponatremia, Chronic tachycardia, Hypokalemia, Left thyroid nodule, and Acute renal insufficiency were also pertinent to this visit.   HPI Israel Wunder Brigandi presents for follow up on type 2 DM  1) DM:  has been taking ozempic for 2 + months.  Has been having Some nausea managed with zofran first followed by phenergan  if needed  at night , not nightly .  18 lbs lost since January,  taking 0.5 mg weekly dose ,  increased dose to 1 mg at the end of April .    2) Depression :  She has been feeling more fatigued.  feels that her depression may be worse .    3) Tachycardia:  chronic.  Feels dizzy when she gets out of bed for a few minutes.  Taking furosemide and spironolactone both daily   4) Smoking less   Outpatient Medications Prior to Visit  Medication Sig Dispense Refill   atorvastatin (LIPITOR) 40 MG tablet TAKE 1 TABLET BY MOUTH ONCE DAILY. 90 tablet 0   blood glucose meter kit and supplies KIT Dispense based on patient and insurance preference. Use up to four times daily as directed. 1 each 11   budesonide (ENTOCORT EC) 3 MG 24 hr capsule Take 3 capsules (9 mg total) by mouth every morning. 90 capsule 6   diazepam (VALIUM) 10 MG tablet TAKE 1 TABLET AT BEDTIME AS NEEDED FOR ANXIETY 30 tablet 0   DULoxetine (CYMBALTA) 30 MG capsule TAKE 1 TABLET BY MOUTH ONCE DAILY. (TO BE TAKEN WITH 60MG TABLET) 90 capsule 1   DULoxetine (CYMBALTA) 60 MG capsule TAKE (1) CAPSULE BY MOUTH ONCE DAILY. 90 capsule 0   Semaglutide,0.25 or 0.5MG/DOS, (OZEMPIC, 0.25 OR 0.5 MG/DOSE,) 2 MG/1.5ML SOPN Inject 0.5 mg into the skin once a week. 1.5 mL 1    traMADol (ULTRAM) 50 MG tablet TAKE (2) TABLETS BY MOUTH EVERY SIX HOURS AS NEEDED FOR PAIN. 240 tablet 2   furosemide (LASIX) 20 MG tablet TAKE 1 TABLET BY MOUTH TWICE DAILY 180 tablet 0   ondansetron (ZOFRAN-ODT) 4 MG disintegrating tablet Take 1 tablet (4 mg total) by mouth every 8 (eight) hours as needed for nausea or vomiting. 30 tablet 11   promethazine (PHENERGAN) 12.5 MG tablet Take 1 tablet (12.5 mg total) by mouth at bedtime as needed for nausea or vomiting. 20 tablet 0   spironolactone (ALDACTONE) 100 MG tablet TAKE 1 TABLET BY MOUTH ONCE DAILY. 90 tablet 0   No facility-administered medications prior to visit.    Review of Systems;  Patient denies headache, fevers, malaise, unintentional weight loss, skin rash, eye pain, sinus congestion and sinus pain, sore throat, dysphagia,  hemoptysis , cough, dyspnea, wheezing, chest pain, palpitations, orthopnea, edema, abdominal pain, nausea, melena, diarrhea, constipation, flank pain, dysuria, hematuria, urinary  Frequency, nocturia, numbness, tingling, seizures,  Focal weakness, Loss of consciousness,  Tremor, insomnia, depression, anxiety, and suicidal ideation.      Objective:  BP 112/78 (BP Location: Left Arm, Patient Position: Sitting, Cuff Size: Normal)   Pulse (!) 131   Temp 97.6 F (36.4 C) (Oral)   Ht 5' (1.524 m)   Wt 131 lb 6.4 oz (59.6 kg)  SpO2 96%   BMI 25.66 kg/m   BP Readings from Last 3 Encounters:  01/03/22 112/78  08/30/21 124/76  06/08/21 127/85    Wt Readings from Last 3 Encounters:  01/03/22 131 lb 6.4 oz (59.6 kg)  12/27/21 133 lb (60.3 kg)  10/11/21 149 lb (67.6 kg)    General appearance: alert, cooperative and appears stated age Ears: normal TM's and external ear canals both ears Throat: lips, mucosa, and tongue normal; teeth and gums normal Neck: no adenopathy, no carotid bruit, supple, symmetrical, trachea midline and thyroid not enlarged, symmetric, no tenderness/mass/nodules Back: symmetric,  no curvature. ROM normal. No CVA tenderness. Lungs: clear to auscultation bilaterally Heart: regular rate and rhythm, S1, S2 normal, no murmur, click, rub or gallop Abdomen: soft, non-tender; bowel sounds normal; no masses,  no organomegaly Pulses: 2+ and symmetric Skin: Skin color, texture, turgor normal. No rashes or lesions Lymph nodes: Cervical, supraclavicular, and axillary nodes normal.  Lab Results  Component Value Date   HGBA1C 6.9 (H) 11/28/2021   HGBA1C 6.9 (H) 08/28/2021   HGBA1C 6.9 (H) 01/30/2021    Lab Results  Component Value Date   CREATININE 1.24 (H) 01/03/2022   CREATININE 0.87 11/28/2021   CREATININE 0.78 08/28/2021    Lab Results  Component Value Date   WBC 13.9 (H) 12/27/2021   HGB 16.8 (H) 12/27/2021   HCT 48.6 (H) 12/27/2021   PLT 267 12/27/2021   GLUCOSE 117 (H) 01/03/2022   CHOL 183 11/28/2021   TRIG 81.0 11/28/2021   HDL 101.70 11/28/2021   LDLDIRECT 79.0 11/19/2016   LDLCALC 65 11/28/2021   ALT 28 01/03/2022   AST 23 01/03/2022   NA 131 (L) 01/03/2022   K 3.0 (L) 01/03/2022   CL 82 (L) 01/03/2022   CREATININE 1.24 (H) 01/03/2022   BUN 18 01/03/2022   CO2 32 01/03/2022   TSH 2.09 07/03/2016   INR 0.9 08/15/2020   HGBA1C 6.9 (H) 11/28/2021   MICROALBUR <0.7 08/28/2021    US THYROID  Result Date: 09/11/2021 CLINICAL DATA:  Thyroid nodule follow-up EXAM: THYROID ULTRASOUND TECHNIQUE: Ultrasound examination of the thyroid gland and adjacent soft tissues was performed. COMPARISON:  None. FINDINGS: Parenchymal Echotexture: Markedly heterogenous Isthmus: 0.3 cm Right lobe: 4.3 x 1.9 x 1.4 cm Left lobe: 3.7 x 1.1 x 1.8 cm _________________________________________________________ Estimated total number of nodules >/= 1 cm: 0 Number of spongiform nodules >/=  2 cm not described below (TR1): 0 Number of mixed cystic and solid nodules >/= 1.5 cm not described below (TR2): 0 _________________________________________________________ 0.7 cm cystic nodule,  containing colloid, in the inferior right thyroid lobe does not meet criteria for FNA or imaging surveillance. IMPRESSION: Marked diffuse heterogeneity of the thyroid parenchyma without discrete suspicious nodule. The above is in keeping with the ACR TI-RADS recommendations - J Am Coll Radiol 2017;14:587-595. Electronically Signed   By: Miachel Roux M.D.   On: 09/11/2021 15:47    Assessment & Plan:   Problem List Items Addressed This Visit     Chronic tachycardia    She is not orthostatic.  I have ordered and reviewed a 12 lead EKG and find that there are no acute changes and patient is in sinus rhythm.    Referring to cardiology for evaluation        Hypokalemia    Secondary to overdiuresis and possibly hypomagnesemia due to weight loss.  Will replace and repeat        Relevant Orders   Basic metabolic panel  Magnesium   Hyponatremia    New onset,  May be due to overdiuresis given drop in GFR and hypokalemia.  Will stop furosemide and repeat BMET in 1 week        Left thyroid nodule    Repeat ultrasound done Feb 2023.  No suspicious nodules needs thyroid function tested given tachycardia and weight loss         Relevant Orders   Thyroid Panel With TSH   Major depressive disorder, single episode, moderate with anxious distress (HCC)    Adding abilify 2.5 mg daily for persistent symptoms and fatigue        Nausea - Primary    Etiology may be multifactorial.  Use of ozempic,  Dehydration  Based on labs.  Not using NSAIDs. Normal EGD Oct 2022 during workup for weight loss        Relevant Medications   ondansetron (ZOFRAN-ODT) 4 MG disintegrating tablet   Other Relevant Orders   Comprehensive metabolic panel (Completed)   Type 2 diabetes mellitus with diabetic neuropathy, unspecified ([place])    Her neuropathy is managed with gabapentin.  Her diabetes is Currently well managed with Invokanna and Ozempic'  She has a metformin intolerance.  Advised to stay at 1.o mg dose and  lengthen the interval between doses  If needed to manage nausea    Lab Results  Component Value Date   HGBA1C 6.9 (H) 11/28/2021   Lab Results  Component Value Date   MICROALBUR <0.7 08/28/2021   MICROALBUR <0.7 08/15/2020    Lab Results  Component Value Date   CREATININE 1.24 (H) 01/03/2022          Other Visit Diagnoses     Tachycardia, unspecified       Relevant Orders   EKG 12-Lead (Completed)   Acute renal insufficiency       Relevant Orders   Basic metabolic panel       I spent a total of  40  minutes with this patient in a face to face visit on the date of this encounter reviewing the last office visit with me in April,  her diet and eating habits, home blood pressure readings,  most recent thyroid imaging study ,  recent EGD and colonoscopy,    and post visit ordering of testing and therapeutics.    Follow-up: No follow-ups on file.   Crecencio Mc, MD

## 2022-01-03 NOTE — Assessment & Plan Note (Signed)
Adding abilify 2 mg daily   To duloxetine 90 mg daily

## 2022-01-03 NOTE — Patient Instructions (Addendum)
Zofran and phenergan refilled  Abilify added for depression   Here are the names of several well respected therapists   Referral to [facility] in process

## 2022-01-04 DIAGNOSIS — E871 Hypo-osmolality and hyponatremia: Secondary | ICD-10-CM | POA: Insufficient documentation

## 2022-01-04 DIAGNOSIS — R000 Tachycardia, unspecified: Secondary | ICD-10-CM | POA: Insufficient documentation

## 2022-01-04 LAB — COMPREHENSIVE METABOLIC PANEL
ALT: 28 U/L (ref 0–35)
AST: 23 U/L (ref 0–37)
Albumin: 4.4 g/dL (ref 3.5–5.2)
Alkaline Phosphatase: 103 U/L (ref 39–117)
BUN: 18 mg/dL (ref 6–23)
CO2: 32 mEq/L (ref 19–32)
Calcium: 10.3 mg/dL (ref 8.4–10.5)
Chloride: 82 mEq/L — ABNORMAL LOW (ref 96–112)
Creatinine, Ser: 1.24 mg/dL — ABNORMAL HIGH (ref 0.40–1.20)
GFR: 46.3 mL/min — ABNORMAL LOW (ref >60.00)
Glucose, Bld: 117 mg/dL — ABNORMAL HIGH (ref 70–99)
Potassium: 3 mEq/L — ABNORMAL LOW (ref 3.5–5.1)
Sodium: 131 mEq/L — ABNORMAL LOW (ref 135–145)
Total Bilirubin: 1.1 mg/dL (ref 0.2–1.2)
Total Protein: 7.5 g/dL (ref 6.0–8.3)

## 2022-01-04 MED ORDER — SPIRONOLACTONE 50 MG PO TABS
50.0000 mg | ORAL_TABLET | Freq: Every day | ORAL | 0 refills | Status: DC
Start: 2022-01-04 — End: 2022-04-25

## 2022-01-04 MED ORDER — POTASSIUM CHLORIDE CRYS ER 20 MEQ PO TBCR: 20.0000 meq | EXTENDED_RELEASE_TABLET | Freq: Every day | ORAL | 3 refills | Status: DC

## 2022-01-04 NOTE — Assessment & Plan Note (Signed)
Repeat ultrasound done Feb 2023.  No suspicious nodules needs thyroid function tested given tachycardia and weight loss

## 2022-01-04 NOTE — Assessment & Plan Note (Signed)
Adding abilify 2.5 mg daily for persistent symptoms and fatigue

## 2022-01-04 NOTE — Assessment & Plan Note (Signed)
She is not orthostatic.  I have ordered and reviewed a 12 lead EKG and find that there are no acute changes and patient is in sinus rhythm.    Referring to cardiology for evaluation

## 2022-01-04 NOTE — Assessment & Plan Note (Signed)
Her neuropathy is managed with gabapentin.  Her diabetes is Currently well managed with Invokanna and Ozempic'  She has a metformin intolerance.  Advised to stay at 1.o mg dose and lengthen the interval between doses  If needed to manage nausea    Lab Results  Component Value Date   HGBA1C 6.9 (H) 11/28/2021   Lab Results  Component Value Date   MICROALBUR <0.7 08/28/2021   MICROALBUR <0.7 08/15/2020    Lab Results  Component Value Date   CREATININE 1.24 (H) 01/03/2022

## 2022-01-04 NOTE — Assessment & Plan Note (Signed)
New onset,  May be due to overdiuresis given drop in GFR and hypokalemia.  Will stop furosemide and repeat BMET in 1 week

## 2022-01-04 NOTE — Assessment & Plan Note (Signed)
Secondary to overdiuresis and possibly hypomagnesemia due to weight loss.  Will replace and repeat

## 2022-01-04 NOTE — Assessment & Plan Note (Addendum)
Etiology may be multifactorial.  Use of ozempic,  Dehydration  Based on labs.  Not using NSAIDs. Normal EGD Oct 2022 during workup for weight loss

## 2022-01-16 ENCOUNTER — Other Ambulatory Visit: Payer: Self-pay | Admitting: Family

## 2022-01-19 ENCOUNTER — Other Ambulatory Visit: Payer: Self-pay

## 2022-01-19 MED ORDER — ATORVASTATIN CALCIUM 40 MG PO TABS: 40.0000 mg | ORAL_TABLET | Freq: Every day | ORAL | 1 refills | Status: DC

## 2022-01-23 ENCOUNTER — Other Ambulatory Visit: Payer: Self-pay | Admitting: Internal Medicine

## 2022-01-23 DIAGNOSIS — F419 Anxiety disorder, unspecified: Secondary | ICD-10-CM

## 2022-01-31 ENCOUNTER — Other Ambulatory Visit: Payer: Self-pay

## 2022-01-31 MED ORDER — BUDESONIDE 3 MG PO CPEP: 9.0000 mg | ORAL_CAPSULE | ORAL | 2 refills | Status: DC

## 2022-02-09 ENCOUNTER — Telehealth: Payer: Self-pay | Admitting: Pharmacist

## 2022-02-09 NOTE — Progress Notes (Signed)
[place] [facility])  [place] Team    02/09/2022  Kristie Jones February 23, 1956 [id number]  Reason for referral:  Problem with Ozempic pen   Referral source:  Care Guide    Outreach:  Successful telephone call with Kristie Jones.  HIPAA identifiers verified.   Assessment: Patient reports that she has been injecting Ozempic 0.5 mg subQ x2 in order to achieve Ozempic 1 mg dose. Patient reports that she administered Ozempic 0.5 mg subQ x 1 dose this past Wednesday and is now out of medication.   Will reach out to PCP office to request a sample pen for next week possibly 2 weeks if office has the supply on hand. Will also request that the office issue a new prescription for the Ozempic 1 mg subQ weekly to [employer] patient assistance program for patient.    Plan: Will contact PCP  regarding sample and new prescription to [employer].  Will follow-up in 1 to 3 business days.   Loretha Brasil, PharmD [place] Pharmacist Office: [phone]

## 2022-02-12 ENCOUNTER — Telehealth: Payer: Self-pay

## 2022-02-12 NOTE — Telephone Encounter (Signed)
Received pt assistance medication. Pt is aware medication is ready for pick up.   Ozempic: 5 boxes

## 2022-02-12 NOTE — Progress Notes (Signed)
[place] [facility])                                            [place] Team    02/12/2022  DAISY LITES 1955/10/10 [id number]    Left HIPAA compliant voice mail for patient to return my call.   Loretha Brasil, PharmD [place] Pharmacist Office: [phone]

## 2022-02-12 NOTE — Progress Notes (Signed)
[place] [facility])                                            [place] Team    02/12/2022  Kristie Jones [birth year] [id number]  Received telephone call back from [name] today. Confirmed with patient that she is aware, Ozempic patient assistance order is at the office.  Patient verbalized understanding. Informed patient that I faxed the new prescription order form to Dr. Derrel Nip today for the Ozempic 1 mg. Informed patient, could take a few weeks for the new dose to arrive. Patient verbalized understanding. No further questions from patient at this time.

## 2022-02-13 NOTE — Telephone Encounter (Signed)
Pt called stating her sister will come and pick up her medication. Kristie Jones

## 2022-02-14 NOTE — Telephone Encounter (Signed)
noted 

## 2022-02-15 ENCOUNTER — Telehealth: Payer: Self-pay

## 2022-02-15 DIAGNOSIS — R000 Tachycardia, unspecified: Secondary | ICD-10-CM

## 2022-02-15 DIAGNOSIS — E871 Hypo-osmolality and hyponatremia: Secondary | ICD-10-CM

## 2022-02-15 NOTE — Telephone Encounter (Signed)
LMTCB to schedule labs 

## 2022-02-15 NOTE — Telephone Encounter (Signed)
Spoke with Kristie Jones and she stated that this has been going on for a while now. Kristie Jones stated that at her last appt in May she was told to call back if she had not heard from Cardiology, Kristie Jones still has not heard from them. I do not see in chart where a referral was placed. Kristie Jones has been scheduled for Tuesday 7/18 at 11:30 am. Kristie Jones was also advised that if her symptoms worsened before her appt she would need to go to the ED. Kristie Jones gave a verbal understanding.

## 2022-02-15 NOTE — Telephone Encounter (Signed)
I called patient back after speaking with Juliann Pulse. I did let patient know that a high HR was consider 120 bpm for over 30 minutes resting. However if CP or SOB she needed be evaluated ASAP. She stated that she had been counting her pulse rate herself & doubling. She did not have any other means of checking her pulse. She was concerned bc she said that her HR is always high & when the weather is so humid she feels that she has to take deeper breaths. Pt really stated that she wanted to know about cardiology referral that she had not heard back on? She said that if her symptoms worsened with CP pr SOB that she would go to ED. She stated that she planned on watching a movie at the theater this afternoon & if sx worsened by them she my go to [facility] walk-in or ED. She said that she did not want to blow this situation out of proportion.

## 2022-02-15 NOTE — Telephone Encounter (Signed)
Patient states she would like to know how high her heart rate needs to be before she goes to the ED or calls 911.

## 2022-02-15 NOTE — Telephone Encounter (Signed)
It didn't get placed...  I have ordered it now.    Can she return for the labs I wanted her to return for over a month ago?  To follow up on her sodium and potassium levels?  (See last results note)

## 2022-02-15 NOTE — Telephone Encounter (Signed)
Patient states she has already seen Dr. Deborra Medina for her heart rate issues and Dr. Derrel Nip referred her to a cardiologist.  Patient states Dr. Derrel Nip asked her to please call us if she hasn't heard from the cardiologist's office in a week.  Patient states that was at least a month ago and she states she had other issues going on and hasn't gotten back with [place].  Patient states her heart rate is still consistently rapid and she has been short of breath while outside in the heat trying to pick something up.  Call was transferred to Access Nurse.

## 2022-02-16 NOTE — Telephone Encounter (Signed)
Patient returned office phone call. She lives a hour away and prefers to do labs on the same day of visit.

## 2022-02-16 NOTE — Telephone Encounter (Signed)
Pt is aware.  

## 2022-02-19 ENCOUNTER — Ambulatory Visit (INDEPENDENT_AMBULATORY_CARE_PROVIDER_SITE_OTHER): Payer: Medicare Other

## 2022-02-19 ENCOUNTER — Encounter: Payer: Self-pay | Admitting: Cardiology

## 2022-02-19 ENCOUNTER — Ambulatory Visit (INDEPENDENT_AMBULATORY_CARE_PROVIDER_SITE_OTHER): Payer: Medicare Other | Admitting: Cardiology

## 2022-02-19 VITALS — BP 120/82 | HR 99 | Ht 60.0 in | Wt 135.2 lb

## 2022-02-19 DIAGNOSIS — R000 Tachycardia, unspecified: Secondary | ICD-10-CM

## 2022-02-19 DIAGNOSIS — R600 Localized edema: Secondary | ICD-10-CM | POA: Diagnosis not present

## 2022-02-19 DIAGNOSIS — F17200 Nicotine dependence, unspecified, uncomplicated: Secondary | ICD-10-CM

## 2022-02-19 DIAGNOSIS — E7800 Pure hypercholesterolemia, unspecified: Secondary | ICD-10-CM | POA: Diagnosis not present

## 2022-02-19 NOTE — Progress Notes (Signed)
Cardiology Office Note:    Date:  02/19/2022   ID:  [name], DOB [birth year], MRN [id number]  PCP:  Crecencio Mc, MD   [place] [facility] Cardiologist:  Kate Sable, MD     Referring MD: Crecencio Mc, MD   Chief Complaint  Patient presents with   Chronic Tachycardia    Patient states that she was seeing double. Meds reviewed with patient.     History of Present Illness:    Kristie Jones is a 66 y.o. female with a hx of hyperlipidemia, diabetes, current smoker x30+ years who presented due to tachycardia.  Patient complains of having elevated heart rates for some time now.  Saw primary care provider 6 weeks ago, heart rate was 131.  She denies palpitations, chest pain or shortness of breath.  Thyroid function tests was ordered by primary care physician.  She has a history of leg edema, ureteral dilatation for which she takes Lasix and Aldactone 100 mg daily x15 years now.  Recent BMP showed patient had AKI, Lasix was stopped, Aldactone reduced to 50 mg daily.  Denies any edema.  Current smoker, working on quitting.  Has lost 20 pounds over the past 3 months due to being on Ozempic.  Past Medical History:  Diagnosis Date   Allergy    Arthritis    Cancer ([place])    MULTIPLE SQUAMOUS CELL-WRIST, BUTTOCKS, ABDOMEN, LIP   Chicken pox    Complication of anesthesia    PT GETS VERY ANXIOUS PRIOR TO ANESTHESIA AND WILL START JERKING MASK OFF   Depression    Diabetes mellitus without complication ([place])    Endometriosis    Headache    Lower extremity edema     Past Surgical History:  Procedure Laterality Date   ABDOMINAL HYSTERECTOMY     COLONOSCOPY WITH PROPOFOL N/A 05/25/2021   Procedure: COLONOSCOPY WITH PROPOFOL;  Surgeon: Lucilla Lame, MD;  Location: [facility] ENDOSCOPY;  Service: Endoscopy;  Laterality: N/A;   CYSTOSCOPY     X3   DIAGNOSTIC LAPAROSCOPY     ESOPHAGOGASTRODUODENOSCOPY (EGD) WITH PROPOFOL N/A 05/25/2021   Procedure:  ESOPHAGOGASTRODUODENOSCOPY (EGD) WITH PROPOFOL;  Surgeon: Lucilla Lame, MD;  Location: [facility] ENDOSCOPY;  Service: Endoscopy;  Laterality: N/A;   MOHS SURGERY     PAROTIDECTOMY Left 06/20/2015   Procedure: PAROTIDECTOMY;  Surgeon: Margaretha Sheffield, MD;  Location: [facility] ORS;  Service: ENT;  Laterality: Left;    Current Medications: Current Meds  Medication Sig   ARIPiprazole (ABILIFY) 2 MG tablet Take 1 tablet (2 mg total) by mouth daily.   atorvastatin (LIPITOR) 40 MG tablet Take 1 tablet (40 mg total) by mouth daily.   blood glucose meter kit and supplies KIT Dispense based on patient and insurance preference. Use up to four times daily as directed.   budesonide (ENTOCORT EC) 3 MG 24 hr capsule Take 3 capsules (9 mg total) by mouth every morning. SCHEDULE VISIT FOR Nov 2023   diazepam (VALIUM) 10 MG tablet TAKE 1 TABLET AT BEDTIME AS NEEDED FOR ANXIETY   DULoxetine (CYMBALTA) 30 MG capsule TAKE 1 TABLET BY MOUTH ONCE DAILY. (TO BE TAKEN WITH 60MG TABLET)   DULoxetine (CYMBALTA) 60 MG capsule TAKE (1) CAPSULE BY MOUTH ONCE DAILY.   ondansetron (ZOFRAN-ODT) 4 MG disintegrating tablet Take 1 tablet (4 mg total) by mouth every 8 (eight) hours as needed for nausea or vomiting.   potassium chloride SA (KLOR-CON M) 20 MEQ tablet Take 1 tablet (20 mEq total) by mouth  daily.   promethazine (PHENERGAN) 12.5 MG tablet Take 1 tablet (12.5 mg total) by mouth at bedtime as needed for nausea or vomiting.   Semaglutide,0.25 or 0.5MG/DOS, (OZEMPIC, 0.25 OR 0.5 MG/DOSE,) 2 MG/1.5ML SOPN Inject 0.5 mg into the skin once a week. (Patient taking differently: Inject 1 mg into the skin once a week.)   spironolactone (ALDACTONE) 50 MG tablet Take 1 tablet (50 mg total) by mouth daily.   traMADol (ULTRAM) 50 MG tablet TAKE (2) TABLETS BY MOUTH EVERY SIX HOURS AS NEEDED FOR PAIN.     Allergies:   Ciprofloxacin, Penicillins, Sulfamethoxazole-trimethoprim, Cefdinir, Iodinated contrast media, Methocarbamol, and Levaquin  [levofloxacin]   Social History   Socioeconomic History   Marital status: Married    Spouse name: Not on file   Number of children: Not on file   Years of education: Not on file   Highest education level: Not on file  Occupational History   Occupation: unemployed    Comment: lost job 04/03/2018  Tobacco Use   Smoking status: Every Day    Packs/day: 0.50    Years: 20.00    Total pack years: 10.00    Types: Cigarettes   Smokeless tobacco: Never  Vaping Use   Vaping Use: Former  Substance and Sexual Activity   Alcohol use: Yes    Comment: Rarely   Drug use: No   Sexual activity: Yes  Other Topics Concern   Not on file  Social History Narrative   Not on file   Social Determinants of Health   Financial Resource Strain: Medium Risk (09/25/2021)   Overall Financial Resource Strain (CARDIA)    Difficulty of Paying Living Expenses: Somewhat hard  Food Insecurity: No Food Insecurity (10/11/2021)   Hunger Vital Sign    Worried About Running Out of Food in the Last Year: Never true    Ran Out of Food in the Last Year: Never true  Transportation Needs: No Transportation Needs (10/11/2021)   PRAPARE - Hydrologist (Medical): No    Lack of Transportation (Non-Medical): No  Physical Activity: Insufficiently Active (10/11/2021)   Exercise Vital Sign    Days of Exercise per Week: 3 days    Minutes of Exercise per Session: 30 min  Stress: Stress Concern Present (11/11/2020)   [place]    Feeling of Stress : Rather much  Social Connections: Socially Integrated (10/11/2021)   Social Connection and Isolation Panel [NHANES]    Frequency of Communication with Friends and Family: More than three times a week    Frequency of Social Gatherings with Friends and Family: Once a week    Attends Religious Services: More than 4 times per year    Active Member of [employer] or Organizations: Yes    Attends Programme researcher, broadcasting/film/video: More than 4 times per year    Marital Status: Married     Family History: The patient's family history includes Arthritis in her mother; Bone cancer in her maternal aunt; Diabetes Mellitus I in her maternal uncle; Hypertension in her mother; Pancreatic cancer in her maternal aunt; Vasculitis in her maternal aunt.  ROS:   Please see the history of present illness.     All other systems reviewed and are negative.  EKGs/Labs/Other Studies Reviewed:    The following studies were reviewed today:   EKG:  EKG is  ordered today.  The ekg ordered today demonstrates sinus rhythm, heart rate 99  Recent Labs: 12/27/2021: Hemoglobin 16.8; Platelets 267 01/03/2022: ALT 28; BUN 18; Creatinine, Ser 1.24; Potassium 3.0; Sodium 131  Recent Lipid Panel    Component Value Date/Time   CHOL 183 11/28/2021 0840   TRIG 81.0 11/28/2021 0840   HDL 101.70 11/28/2021 0840   CHOLHDL 2 11/28/2021 0840   VLDL 16.2 11/28/2021 0840   LDLCALC 65 11/28/2021 0840   LDLCALC 70 10/25/2017 1633   LDLDIRECT 79.0 11/19/2016 1715     Risk Assessment/Calculations:          Physical Exam:    VS:  BP 120/82 (BP Location: Right Arm, Patient Position: Sitting, Cuff Size: Normal)   Pulse 99   Ht 5' (1.524 m)   Wt 135 lb 3.2 oz (61.3 kg)   SpO2 98%   BMI 26.40 kg/m     Wt Readings from Last 3 Encounters:  02/19/22 135 lb 3.2 oz (61.3 kg)  01/03/22 131 lb 6.4 oz (59.6 kg)  12/27/21 133 lb (60.3 kg)     GEN:  Well nourished, well developed in no acute distress HEENT: Normal NECK: No JVD; No carotid bruits LYMPHATICS: No lymphadenopathy CARDIAC: RRR, no murmurs, rubs, gallops RESPIRATORY:  Clear to auscultation without rales, wheezing or rhonchi  ABDOMEN: Soft, non-tender, non-distended MUSCULOSKELETAL:  No edema; No deformity  SKIN: Warm and dry NEUROLOGIC:  Alert and oriented x 3 PSYCHIATRIC:  Normal affect   ASSESSMENT:    1. Leg edema   2. Tachycardia   3. Pure  hypercholesterolemia   4. Smoking    PLAN:    In order of problems listed above:  Leg edema, varicose veins, get echo to rule out any structural abnormalities. Tachycardia, she may have inappropriate sinus tach, EKG today was normal, showing high normal rates at 99.  Place cardiac monitor to obtain average heart rate.  If average heart rate is elevated, may consider AV nodal agent.  She is otherwise asymptomatic Hyperlipidemia, cholesterol controlled.  Continue Lipitor 40 mg daily. Current smoker, smoking cessation advised.  Follow-up after echo and cardiac monitor       Medication Adjustments/Labs and Tests Ordered: Current medicines are reviewed at length with the patient today.  Concerns regarding medicines are outlined above.  Orders Placed This Encounter  Procedures   LONG TERM MONITOR (3-14 DAYS)   EKG 12-Lead   ECHOCARDIOGRAM COMPLETE   No orders of the defined types were placed in this encounter.   Patient Instructions  Medication Instructions:  Your physician recommends that you continue on your current medications as directed. Please refer to the Current Medication list given to you today.  *If you need a refill on your cardiac medications before your next appointment, please call your pharmacy*     Testing/Procedures:  Your physician has requested that you have an echocardiogram. Echocardiography is a painless test that uses sound waves to create images of your heart. It provides your doctor with information about the size and shape of your heart and how well your heart's chambers and valves are working. This procedure takes approximately one hour. There are no restrictions for this procedure.  2.   Your physician has recommended that you wear a Zio XT monitor for 2 weeks. This will be mailed to your home address in 4-5 business days.   Your clinician has requested a Zio heart rhythm monitor by [employer] to be mailed to your home for you to wear for 14 days. You  should expect a small box to arrive via USPS (or FedEx  in some cases) within this next week. If you do not receive it please call [employer] at [phone].  Closely watching your heart at this time will help your care team understand more and provide information needed to develop your plan of care.  Please apply your Zio patch monitor the day you receive it. Keep this packaging, you will use this to return your Zio monitor.  You will easily be able to apply the monitor with the instructions provided in the Patient Guide.  If you need assistance, [employer] representatives are available 24/7 at [phone].  You can also download the [facility] app on your phone to view detailed application instructions and log symptoms.  After you wear your monitor for 14 days, place it back in the blue box or envelope, along with your Symptom Log.  To send your monitor back: Simply use the pre-addressed and pre-paid box/envelope.  Send it back through [employer] the same day you remove it via your local post office or by placing it in your mailbox.  As soon as we receive the results, they will be reviewed and your clinician will contact you.  For the first 24 hours- it is essential to not shower or exercise, to allow the patch to adhere to your skin. Avoid excessive sweating to help maximize wear time. Do not submerge the device, no hot tubs, and no swimming pools. Keep any lotions or oils away from the patch. After 24 hours you may shower with the patch on. Take brief showers with your back facing the shower head.  Do not remove patch once it has been placed because that will interrupt data and decrease adhesive wear time. Push the button when you have any symptoms and write down what you were feeling. Once you have completed wearing your monitor, remove and place into box which has postage paid and place in your outgoing mailbox.  If for some reason you have misplaced your box then call our office and we  can provide another box and/or mail it off for you.    Follow-Up: At [facility], you and your health needs are our priority.  As part of our continuing mission to provide you with exceptional heart care, we have created designated Provider Care Teams.  These Care Teams include your primary Cardiologist (physician) and Advanced Practice Providers (APPs -  Physician Assistants and Nurse Practitioners) who all work together to provide you with the care you need, when you need it.  We recommend signing up for the patient portal called "MyChart".  Sign up information is provided on this After Visit Summary.  MyChart is used to connect with patients for Virtual Visits (Telemedicine).  Patients are able to view lab/test results, encounter notes, upcoming appointments, etc.  Non-urgent messages can be sent to your provider as well.   To learn more about what you can do with MyChart, go to [email or web address].    Your next appointment:   6-8 week(s)  The format for your next appointment:   In Person  Provider:   You may see Kate Sable, MD or one of the following Advanced Practice Providers on your designated Care Team:   Murray Hodgkins, NP Christell Faith, PA-C Cadence Kathlen Mody, [place]    Other Instructions   Important Information About Sugar         Signed, Kate Sable, MD  02/19/2022 4:11 PM    [place]

## 2022-02-19 NOTE — Patient Instructions (Signed)
Medication Instructions:  Your physician recommends that you continue on your current medications as directed. Please refer to the Current Medication list given to you today.  *If you need a refill on your cardiac medications before your next appointment, please call your pharmacy*     Testing/Procedures:  Your physician has requested that you have an echocardiogram. Echocardiography is a painless test that uses sound waves to create images of your heart. It provides your doctor with information about the size and shape of your heart and how well your heart's chambers and valves are working. This procedure takes approximately one hour. There are no restrictions for this procedure.  2.   Your physician has recommended that you wear a Zio XT monitor for 2 weeks. This will be mailed to your home address in 4-5 business days.   Your clinician has requested a Zio heart rhythm monitor by [employer] to be mailed to your home for you to wear for 14 days. You should expect a small box to arrive via USPS (or FedEx in some cases) within this next week. If you do not receive it please call [employer] at [phone].  Closely watching your heart at this time will help your care team understand more and provide information needed to develop your plan of care.  Please apply your Zio patch monitor the day you receive it. Keep this packaging, you will use this to return your Zio monitor.  You will easily be able to apply the monitor with the instructions provided in the Patient Guide.  If you need assistance, [employer] representatives are available 24/7 at [phone].  You can also download the [facility] app on your phone to view detailed application instructions and log symptoms.  After you wear your monitor for 14 days, place it back in the blue box or envelope, along with your Symptom Log.  To send your monitor back: Simply use the pre-addressed and pre-paid box/envelope.  Send it back through [employer]  the same day you remove it via your local post office or by placing it in your mailbox.  As soon as we receive the results, they will be reviewed and your clinician will contact you.  For the first 24 hours- it is essential to not shower or exercise, to allow the patch to adhere to your skin. Avoid excessive sweating to help maximize wear time. Do not submerge the device, no hot tubs, and no swimming pools. Keep any lotions or oils away from the patch. After 24 hours you may shower with the patch on. Take brief showers with your back facing the shower head.  Do not remove patch once it has been placed because that will interrupt data and decrease adhesive wear time. Push the button when you have any symptoms and write down what you were feeling. Once you have completed wearing your monitor, remove and place into box which has postage paid and place in your outgoing mailbox.  If for some reason you have misplaced your box then call our office and we can provide another box and/or mail it off for you.    Follow-Up: At [facility], you and your health needs are our priority.  As part of our continuing mission to provide you with exceptional heart care, we have created designated Provider Care Teams.  These Care Teams include your primary Cardiologist (physician) and Advanced Practice Providers (APPs -  Physician Assistants and Nurse Practitioners) who all work together to provide you with the care you need, when  you need it.  We recommend signing up for the patient portal called "MyChart".  Sign up information is provided on this After Visit Summary.  MyChart is used to connect with patients for Virtual Visits (Telemedicine).  Patients are able to view lab/test results, encounter notes, upcoming appointments, etc.  Non-urgent messages can be sent to your provider as well.   To learn more about what you can do with MyChart, go to [email or web address].    Your next appointment:   6-8  week(s)  The format for your next appointment:   In Person  Provider:   You may see Kate Sable, MD or one of the following Advanced Practice Providers on your designated Care Team:   Murray Hodgkins, NP Christell Faith, PA-C Cadence Kathlen Mody, [place]    Other Instructions   Important Information About Sugar

## 2022-02-20 ENCOUNTER — Ambulatory Visit: Payer: Medicare Other | Admitting: Internal Medicine

## 2022-02-22 DIAGNOSIS — R000 Tachycardia, unspecified: Secondary | ICD-10-CM | POA: Diagnosis not present

## 2022-02-26 ENCOUNTER — Telehealth: Payer: Self-pay | Admitting: Internal Medicine

## 2022-03-13 ENCOUNTER — Other Ambulatory Visit: Payer: Self-pay | Admitting: Internal Medicine

## 2022-03-13 DIAGNOSIS — Z760 Encounter for issue of repeat prescription: Secondary | ICD-10-CM

## 2022-03-13 MED ORDER — TRAMADOL HCL 50 MG PO TABS: ORAL_TABLET | ORAL | 2 refills | Status: DC

## 2022-03-13 NOTE — Telephone Encounter (Signed)
Pt called in requesting refill on medication (traMADol (ULTRAM) 50 MG tablet)... Pt requesting callback  ?

## 2022-03-13 NOTE — Telephone Encounter (Signed)
Refilled: 11/14/2021 Last OV: 01/03/2022 Next OV: 03/28/2022

## 2022-03-14 MED ORDER — TRAMADOL HCL 50 MG PO TABS: ORAL_TABLET | ORAL | 2 refills | Status: DC

## 2022-03-14 NOTE — Telephone Encounter (Signed)
PLEASE CALL THE PHARMACY THAT RECEIVED THE TRAMADOL RX IN ERROR ;  I HAVE RESENT IT TO [facility]

## 2022-03-14 NOTE — Telephone Encounter (Signed)
Sent to wrong pharmacy. Needs to be sent to [facility].

## 2022-03-14 NOTE — Telephone Encounter (Deleted)
Patient called stating that the medication refill (ttraMADol (ULTRAM) 50 MG tablet) should have been sent to  [place], [place] - [place].

## 2022-03-14 NOTE — Telephone Encounter (Signed)
Patient called stating that the medication refill (ttraMADol (ULTRAM) 50 MG tablet) should have been sent to  [place], [place] - [place].

## 2022-03-14 NOTE — Telephone Encounter (Signed)
Pt is aware that rx has been sent to correct pharmacy.

## 2022-03-14 NOTE — Telephone Encounter (Signed)
Rx has been canceled at the wrong pharmacy.

## 2022-03-19 DIAGNOSIS — R000 Tachycardia, unspecified: Secondary | ICD-10-CM | POA: Diagnosis not present

## 2022-03-21 ENCOUNTER — Ambulatory Visit (INDEPENDENT_AMBULATORY_CARE_PROVIDER_SITE_OTHER): Payer: Medicare Other

## 2022-03-21 ENCOUNTER — Telehealth: Payer: Self-pay | Admitting: Pharmacist

## 2022-03-21 DIAGNOSIS — R600 Localized edema: Secondary | ICD-10-CM | POA: Diagnosis not present

## 2022-03-21 LAB — ECHOCARDIOGRAM COMPLETE
AR max vel: 2.34 cm2
AV Area VTI: 2.14 cm2
AV Area mean vel: 2.33 cm2
AV Mean grad: 3 mmHg
AV Peak grad: 5.9 mmHg
Ao pk vel: 1.21 m/s
Area-P 1/2: 5.5 cm2
Calc EF: 51.8 %
S' Lateral: 2.1 cm
Single Plane A2C EF: 50.8 %
Single Plane A4C EF: 50.4 %

## 2022-03-21 NOTE — Progress Notes (Signed)
[facility])                                            [place] Team    03/21/2022  RIMSHA TREMBLEY [birth year] [id number]  Received telephonic outreach from Mrs. Jashira Benefiel today, HIPAA identifiers were verified. Patient states that she has not received any follow up for the prescription submitted for the Ozempic dose increase. Patient states that she has 1 and 1/2 Ozempic 0.5 mg pens remaining.  She also reports that "Erlene Quan" from [employer] had called and provided a case number and requested that patient call back. On [employer] back, patient reports that she was routed around and was unable to determine the nature of the call.   Placed call to [employer], representative for [employer] confirmed that the new prescription for the Ozempic 1 mg pen was processed on 03/12/2022. Rep stated that the medication should be arriving to the office within 10 to 14 business days. Representative was able to verify that the reason for the additional outreach from [employer] with case number was in regard to a report made for a technical issue with the medication in June.    Placed follow up phone call to Mrs. Dardis, relayed that the new prescription has been received and processed. The shipment should be arriving within 10 to 14 business days to [facility]. Relayed to patient the nature of the telephone call from [employer]. Patient stated that she hoped that she had enough Ozempic to last her until her shipment arrives, I let her know that she can call the office and request a sample if she runs out of Ozempic prior to her shipment arriving. Mrs. Boan verbalized understanding, had no further questions or concerns for me, and was appreciative for the follow up information.   Loretha Brasil, PharmD [place] Pharmacist Office: [phone]

## 2022-03-23 ENCOUNTER — Telehealth: Payer: Self-pay

## 2022-03-23 MED ORDER — FUROSEMIDE 20 MG PO TABS: 20.0000 mg | ORAL_TABLET | Freq: Every day | ORAL | 1 refills | Status: DC

## 2022-03-23 NOTE — Telephone Encounter (Signed)
-----   Message from Kate Sable, MD sent at 03/22/2022  5:25 PM EDT ----- Echocardiogram shows normal systolic function.  Relaxing function appears impaired, which my be secondary to comorbidities such as diabetes.  Overall okay echocardiogram.  if edema persist, may consider diuretic.

## 2022-03-23 NOTE — Telephone Encounter (Signed)
Discussed result note with patient. Patient stated that she is does feel like her edema is not being managed. She does not think the Spironolactone that she is taking is enough. She stated that Dr. Derrel Nip reduced her Spironolactone to 14 MG recently, and that she also used to be on Lasix 20 MG.   Patient's last CMP result note form 01/04/22 : Crecencio Mc, MD  01/04/2022  6:21 PM EDT Back to Top    Your labs strongly suggest that you are very dehydrated.  Your kidney function is low and your potassium is low as well.  Please stop the furosemide ,  reduce the spironolactone to 50 mg daily,  start  the potassiium supplement I sent to your pharmacy and return in one week for repeat labs, which have been ordered    Spoke with Dr. Garen Lah and he recommended that we re-start Lasix 20 MG, and also get a BMP lab draw in 5 days.   Called patient and informed her of the recommendation. She informed me that she is getting a lab draw next Wednesday at Dr. Lupita Dawn office. I will look for patients lab result next week and forward results to  Dr. Garen Lah.

## 2022-03-28 ENCOUNTER — Encounter: Payer: Self-pay | Admitting: Internal Medicine

## 2022-03-28 ENCOUNTER — Telehealth: Payer: Self-pay

## 2022-03-28 ENCOUNTER — Ambulatory Visit (INDEPENDENT_AMBULATORY_CARE_PROVIDER_SITE_OTHER): Payer: Medicare Other | Admitting: Internal Medicine

## 2022-03-28 VITALS — BP 148/88 | HR 92 | Temp 97.8°F | Ht 60.0 in | Wt 134.0 lb

## 2022-03-28 DIAGNOSIS — N179 Acute kidney failure, unspecified: Secondary | ICD-10-CM

## 2022-03-28 DIAGNOSIS — E871 Hypo-osmolality and hyponatremia: Secondary | ICD-10-CM

## 2022-03-28 DIAGNOSIS — E1140 Type 2 diabetes mellitus with diabetic neuropathy, unspecified: Secondary | ICD-10-CM

## 2022-03-28 DIAGNOSIS — E876 Hypokalemia: Secondary | ICD-10-CM

## 2022-03-28 DIAGNOSIS — E785 Hyperlipidemia, unspecified: Secondary | ICD-10-CM

## 2022-03-28 DIAGNOSIS — Z87448 Personal history of other diseases of urinary system: Secondary | ICD-10-CM | POA: Diagnosis not present

## 2022-03-28 DIAGNOSIS — K52832 Lymphocytic colitis: Secondary | ICD-10-CM

## 2022-03-28 DIAGNOSIS — Z90722 Acquired absence of ovaries, bilateral: Secondary | ICD-10-CM | POA: Diagnosis not present

## 2022-03-28 DIAGNOSIS — Z9079 Acquired absence of other genital organ(s): Secondary | ICD-10-CM

## 2022-03-28 DIAGNOSIS — Z90710 Acquired absence of both cervix and uterus: Secondary | ICD-10-CM | POA: Diagnosis not present

## 2022-03-28 DIAGNOSIS — R000 Tachycardia, unspecified: Secondary | ICD-10-CM | POA: Diagnosis not present

## 2022-03-28 NOTE — Patient Instructions (Signed)
I have given you an ozempic sample pen that contains 2 mg of medication.  You will need to give yourself 2  0.5 mg doses  each week  while we await the receipt of the 1.0 mg pens from [employer]

## 2022-03-28 NOTE — Telephone Encounter (Signed)
Medication Samples have been provided to the patient.  Drug name: Ozempic       Strength: 2 mg/3 mL        Qty: 1 box  LOT: [id number]  Exp.Date: 09/06/2023  Dosing instructions: Inject 1 mg once weekly.   The patient has been instructed regarding the correct time, dose, and frequency of taking this medication, including desired effects and most common side effects.   Grey Schlauch 4:32 PM 03/28/2022

## 2022-03-28 NOTE — Progress Notes (Unsigned)
Subjective:  Patient ID: Kristie Jones, female    DOB: April 15, 1956  Age: 66 y.o. MRN: [id number]  CC: There were no encounter diagnoses.   HPI Kristie Jones presents for AKI with hypokalemia,  tachycardia  Chief Complaint  Patient presents with   Follow-up    Follow up on tachycardia   1) Sinus tachycardia :  she was referred to cardiology:  ECHO and 14 day Holter monitor done.  Sinus tach  normal ECHO sees cardiology on sept 7   2 ) AKI:  noted in May on labs with drop in GFR and potassium 3.0  Diuretic doses were reduced .  Unfortunately She has not had follow up labs due to transportation issues. And at the time of her cardiology evaluatio she had had a 3 lb weight gain due to edema after medication change .  Her cardiologist added 20 mg furosemide  on Aug 18 and she has dropped 3-4 lbs since then. Still taking potassium   having  leg cramps at night   3) type 2 DM: currently taking ozempic 1 mg  weekly and in need of  samples . Tolerating without nausea . Weight has plateaued.  Does not want to lose more weight .  Cbgs have ranged from from 102 to 155   Lab Results  Component Value Date   HGBA1C 6.9 (H) 11/28/2021      Outpatient Medications Prior to Visit  Medication Sig Dispense Refill   ARIPiprazole (ABILIFY) 2 MG tablet Take 1 tablet (2 mg total) by mouth daily. 30 tablet 2   atorvastatin (LIPITOR) 40 MG tablet Take 1 tablet (40 mg total) by mouth daily. 90 tablet 1   blood glucose meter kit and supplies KIT Dispense based on patient and insurance preference. Use up to four times daily as directed. 1 each 11   budesonide (ENTOCORT EC) 3 MG 24 hr capsule Take 3 capsules (9 mg total) by mouth every morning. SCHEDULE VISIT FOR Nov 2023 90 capsule 2   diazepam (VALIUM) 10 MG tablet TAKE 1 TABLET AT BEDTIME AS NEEDED FOR ANXIETY 30 tablet 0   DULoxetine (CYMBALTA) 30 MG capsule TAKE 1 TABLET BY MOUTH ONCE DAILY. (TO BE TAKEN WITH 60MG TABLET) 90 capsule 1   DULoxetine  (CYMBALTA) 60 MG capsule TAKE (1) CAPSULE BY MOUTH ONCE DAILY. 90 capsule 0   furosemide (LASIX) 20 MG tablet Take 1 tablet (20 mg total) by mouth daily. 30 tablet 1   ondansetron (ZOFRAN-ODT) 4 MG disintegrating tablet Take 1 tablet (4 mg total) by mouth every 8 (eight) hours as needed for nausea or vomiting. 45 tablet 11   potassium chloride SA (KLOR-CON M) 20 MEQ tablet Take 1 tablet (20 mEq total) by mouth daily. 30 tablet 3   promethazine (PHENERGAN) 12.5 MG tablet Take 1 tablet (12.5 mg total) by mouth at bedtime as needed for nausea or vomiting. 20 tablet 5   Semaglutide,0.25 or 0.5MG/DOS, (OZEMPIC, 0.25 OR 0.5 MG/DOSE,) 2 MG/1.5ML SOPN Inject 0.5 mg into the skin once a week. (Patient taking differently: Inject 1 mg into the skin once a week.) 1.5 mL 1   spironolactone (ALDACTONE) 50 MG tablet Take 1 tablet (50 mg total) by mouth daily. 90 tablet 0   traMADol (ULTRAM) 50 MG tablet TAKE (2) TABLETS BY MOUTH EVERY SIX HOURS AS NEEDED FOR PAIN. 240 tablet 2   No facility-administered medications prior to visit.    Review of Systems;  Patient denies headache, fevers, malaise, unintentional  weight loss, skin rash, eye pain, sinus congestion and sinus pain, sore throat, dysphagia,  hemoptysis , cough, dyspnea, wheezing, chest pain, palpitations, orthopnea, edema, abdominal pain, nausea, melena, diarrhea, constipation, flank pain, dysuria, hematuria, urinary  Frequency, nocturia, numbness, tingling, seizures,  Focal weakness, Loss of consciousness,  Tremor, insomnia, depression, anxiety, and suicidal ideation.      Objective:  BP (!) 148/88 (BP Location: Left Arm, Patient Position: Sitting, Cuff Size: Normal)   Pulse 92   Temp 97.8 F (36.6 C) (Oral)   Ht 5' (1.524 m)   Wt 134 lb (60.8 kg)   SpO2 96%   BMI 26.17 kg/m   BP Readings from Last 3 Encounters:  03/28/22 (!) 148/88  02/19/22 120/82  01/03/22 112/78    Wt Readings from Last 3 Encounters:  03/28/22 134 lb (60.8 kg)   02/19/22 135 lb 3.2 oz (61.3 kg)  01/03/22 131 lb 6.4 oz (59.6 kg)    General appearance: alert, cooperative and appears stated age Ears: normal TM's and external ear canals both ears Throat: lips, mucosa, and tongue normal; teeth and gums normal Neck: no adenopathy, no carotid bruit, supple, symmetrical, trachea midline and thyroid not enlarged, symmetric, no tenderness/mass/nodules Back: symmetric, no curvature. ROM normal. No CVA tenderness. Lungs: clear to auscultation bilaterally Heart: regular rate and rhythm, S1, S2 normal, no murmur, click, rub or gallop Abdomen: soft, non-tender; bowel sounds normal; no masses,  no organomegaly Pulses: 2+ and symmetric Skin: Skin color, texture, turgor normal. No rashes or lesions Lymph nodes: Cervical, supraclavicular, and axillary nodes normal.  Lab Results  Component Value Date   HGBA1C 6.9 (H) 11/28/2021   HGBA1C 6.9 (H) 08/28/2021   HGBA1C 6.9 (H) 01/30/2021    Lab Results  Component Value Date   CREATININE 1.24 (H) 01/03/2022   CREATININE 0.87 11/28/2021   CREATININE 0.78 08/28/2021    Lab Results  Component Value Date   WBC 13.9 (H) 12/27/2021   HGB 16.8 (H) 12/27/2021   HCT 48.6 (H) 12/27/2021   PLT 267 12/27/2021   GLUCOSE 117 (H) 01/03/2022   CHOL 183 11/28/2021   TRIG 81.0 11/28/2021   HDL 101.70 11/28/2021   LDLDIRECT 79.0 11/19/2016   LDLCALC 65 11/28/2021   ALT 28 01/03/2022   AST 23 01/03/2022   NA 131 (L) 01/03/2022   K 3.0 (L) 01/03/2022   CL 82 (L) 01/03/2022   CREATININE 1.24 (H) 01/03/2022   BUN 18 01/03/2022   CO2 32 01/03/2022   TSH 2.09 07/03/2016   INR 0.9 08/15/2020   HGBA1C 6.9 (H) 11/28/2021   MICROALBUR <0.7 08/28/2021    US THYROID  Result Date: 09/11/2021 CLINICAL DATA:  Thyroid nodule follow-up EXAM: THYROID ULTRASOUND TECHNIQUE: Ultrasound examination of the thyroid gland and adjacent soft tissues was performed. COMPARISON:  None. FINDINGS: Parenchymal Echotexture: Markedly  heterogenous Isthmus: 0.3 cm Right lobe: 4.3 x 1.9 x 1.4 cm Left lobe: 3.7 x 1.1 x 1.8 cm _________________________________________________________ Estimated total number of nodules >/= 1 cm: 0 Number of spongiform nodules >/=  2 cm not described below (TR1): 0 Number of mixed cystic and solid nodules >/= 1.5 cm not described below (TR2): 0 _________________________________________________________ 0.7 cm cystic nodule, containing colloid, in the inferior right thyroid lobe does not meet criteria for FNA or imaging surveillance. IMPRESSION: Marked diffuse heterogeneity of the thyroid parenchyma without discrete suspicious nodule. The above is in keeping with the ACR TI-RADS recommendations - J Am Coll Radiol 2017;14:587-595. Electronically Signed   By: Sharen Heck  Mir M.D.   On: 09/11/2021 15:47    Assessment & Plan:   Problem List Items Addressed This Visit   None   I spent a total of   minutes with this patient in a face to face visit on the date of this encounter reviewing the last office visit with me on        ,  most recent with patient's cardiologist in    ,  patient'ss diet and eating habits, home blood pressure readings ,  most recent imaging study ,   and post visit ordering of testing and therapeutics.    Follow-up: No follow-ups on file.   Crecencio Mc, MD

## 2022-03-29 ENCOUNTER — Other Ambulatory Visit: Payer: Self-pay

## 2022-03-29 DIAGNOSIS — N179 Acute kidney failure, unspecified: Secondary | ICD-10-CM | POA: Insufficient documentation

## 2022-03-29 LAB — COMPREHENSIVE METABOLIC PANEL
ALT: 23 U/L (ref 0–35)
AST: 19 U/L (ref 0–37)
Albumin: 4.2 g/dL (ref 3.5–5.2)
Alkaline Phosphatase: 116 U/L (ref 39–117)
BUN: 11 mg/dL (ref 6–23)
CO2: 28 mEq/L (ref 19–32)
Calcium: 9.1 mg/dL (ref 8.4–10.5)
Chloride: 94 mEq/L — ABNORMAL LOW (ref 96–112)
Creatinine, Ser: 0.89 mg/dL (ref 0.40–1.20)
GFR: 68.81 mL/min (ref >60.00)
Glucose, Bld: 108 mg/dL — ABNORMAL HIGH (ref 70–99)
Potassium: 3.9 mEq/L (ref 3.5–5.1)
Sodium: 134 mEq/L — ABNORMAL LOW (ref 135–145)
Total Bilirubin: 0.3 mg/dL (ref 0.2–1.2)
Total Protein: 7 g/dL (ref 6.0–8.3)

## 2022-03-29 LAB — CBC WITH DIFFERENTIAL/PLATELET
Basophils Absolute: 0 10*3/uL (ref 0.0–0.1)
Basophils Relative: 0.3 % (ref 0.0–3.0)
Eosinophils Absolute: 0 10*3/uL (ref 0.0–0.7)
Eosinophils Relative: 0.5 % (ref 0.0–5.0)
HCT: 46.7 % — ABNORMAL HIGH (ref 36.0–46.0)
Hemoglobin: 15.9 g/dL — ABNORMAL HIGH (ref 12.0–15.0)
Lymphocytes Relative: 14 % (ref 12.0–46.0)
Lymphs Abs: 1.2 10*3/uL (ref 0.7–4.0)
MCHC: 34 g/dL (ref 30.0–36.0)
MCV: 97.7 fl (ref 78.0–100.0)
Monocytes Absolute: 0.8 10*3/uL (ref 0.1–1.0)
Monocytes Relative: 8.9 % (ref 3.0–12.0)
Neutro Abs: 6.7 10*3/uL (ref 1.4–7.7)
Neutrophils Relative %: 76.3 % (ref 43.0–77.0)
Platelets: 208 10*3/uL (ref 150.0–400.0)
RBC: 4.78 Mil/uL (ref 3.87–5.11)
RDW: 14.1 % (ref 11.5–15.5)
WBC: 8.8 10*3/uL (ref 4.0–10.5)

## 2022-03-29 LAB — LIPID PANEL
Cholesterol: 169 mg/dL (ref 0–200)
HDL: 101.5 mg/dL (ref >39.00)
LDL Cholesterol: 52 mg/dL (ref 0–99)
NonHDL: 67.55
Total CHOL/HDL Ratio: 2
Triglycerides: 80 mg/dL (ref 0.0–149.0)
VLDL: 16 mg/dL (ref 0.0–40.0)

## 2022-03-29 LAB — MAGNESIUM: Magnesium: 1.9 mg/dL (ref 1.5–2.5)

## 2022-03-29 LAB — HEMOGLOBIN A1C: Hgb A1c MFr Bld: 6 % (ref 4.6–6.5)

## 2022-03-29 MED ORDER — SEMAGLUTIDE (1 MG/DOSE) 4 MG/3ML ~~LOC~~ SOPN: 1.0000 mg | PEN_INJECTOR | SUBCUTANEOUS | 0 refills | Status: DC

## 2022-03-29 NOTE — Telephone Encounter (Signed)
Patient called and made aware that her Ozempic 4 pens from patient assistance was received & she could pick uo at her convenience.

## 2022-03-29 NOTE — Assessment & Plan Note (Addendum)
Her neuropathy is managed with gabapentin.  Her diabetes is Currently well managed with  Ozempic only.   She has a metformin intolerance.  Advised to stay at 1.0 mg dose and lengthen the interval between doses  If needed to manage nausea and maintain weight  . Continue lipitor   Lab Results  Component Value Date   HGBA1C 6.0 03/28/2022   Lab Results  Component Value Date   MICROALBUR <0.7 08/28/2021   MICROALBUR <0.7 08/15/2020    Lab Results  Component Value Date   CREATININE 0.89 03/28/2022

## 2022-03-29 NOTE — Assessment & Plan Note (Signed)
Improved secondary to overdiuresis.

## 2022-03-29 NOTE — Telephone Encounter (Signed)
CMP was resulted from PCP's office yesterday. Will route to Dr. Garen Lah to review as requested.

## 2022-03-29 NOTE — Assessment & Plan Note (Signed)
Resolved,  Potassium also normalized.   Lab Results  Component Value Date   CREATININE 0.89 03/28/2022   Lab Results  Component Value Date   NA 134 (L) 03/28/2022   K 3.9 03/28/2022   CL 94 (L) 03/28/2022   CO2 28 03/28/2022

## 2022-03-29 NOTE — Assessment & Plan Note (Signed)
Her 14 day monitor showed only sinus tachycardia, and ECHO was normal .  She has cardiology follow up on Sept 7

## 2022-03-30 NOTE — Telephone Encounter (Signed)
Called patient and left a detailed VM per DPR on file with the following result note:  Creatinine normal, continue Lasix as prescribed.  Keep follow-up appointment.   Encouraged patient to call back with any questions or concerns.

## 2022-04-12 ENCOUNTER — Ambulatory Visit: Payer: Medicare Other | Admitting: Cardiology

## 2022-04-18 ENCOUNTER — Telehealth: Payer: Self-pay

## 2022-04-18 NOTE — Telephone Encounter (Signed)
Patient came in to office on 9/13 to pick up Ozempic medication from patient assistance at 12:45

## 2022-04-25 ENCOUNTER — Telehealth: Payer: Self-pay

## 2022-04-25 DIAGNOSIS — F32A Depression, unspecified: Secondary | ICD-10-CM

## 2022-04-25 MED ORDER — DULOXETINE HCL 60 MG PO CPEP: 60.0000 mg | ORAL_CAPSULE | Freq: Every day | ORAL | 0 refills | Status: DC

## 2022-04-25 MED ORDER — SPIRONOLACTONE 50 MG PO TABS: 50.0000 mg | ORAL_TABLET | Freq: Every day | ORAL | 0 refills | Status: DC

## 2022-04-25 MED ORDER — ARIPIPRAZOLE 2 MG PO TABS: 2.0000 mg | ORAL_TABLET | Freq: Every day | ORAL | 0 refills | Status: DC

## 2022-04-25 NOTE — Telephone Encounter (Signed)
Patient states she is frustrated because she has had difficulty trying to get her medications refilled.  Patient states she needs refills for the following:   spironolactone (ALDACTONE) 50 MG tablet (patient states she has been trying to get this refilled for one week)  ARIPiprazole (ABILIFY) 2 MG tablet (patient states she is out) - (patient states she has been trying to get this refilled for two weeks)  DULoxetine (CYMBALTA) 60 MG capsule (tonight is the last tablet she has) - (patient states she has been trying to get this refilled for one week)  *Patient states her preferred pharmacy is [facility]  Patient states she would appreciate a call back.

## 2022-04-25 NOTE — Telephone Encounter (Signed)
Medications have been refilled. Pt is aware.

## 2022-05-10 ENCOUNTER — Encounter: Payer: Self-pay | Admitting: Cardiology

## 2022-05-10 NOTE — Progress Notes (Addendum)
Cardiology Clinic Note   Patient Name: PATCHES MCDONNELL Date of Encounter: 05/11/2022  Primary Care Provider:  Crecencio Mc, MD Primary Cardiologist:  Kate Sable, MD  Patient Profile    66 year old female with history of hyperlipidemia, diabetes, current smoker x30+ years, chronic tachycardia, peripheral edema, who presents today for follow-up.  Past Medical History    Past Medical History:  Diagnosis Date   Allergy    Arthritis    Cancer ([place])    MULTIPLE SQUAMOUS CELL-WRIST, BUTTOCKS, ABDOMEN, LIP   Chicken pox    Complication of anesthesia    PT GETS VERY ANXIOUS PRIOR TO ANESTHESIA AND WILL START JERKING MASK OFF   Depression    Diabetes mellitus without complication ([place])    Endometriosis    Headache    Hyperlipidemia associated with type 2 diabetes mellitus ([facility])    Lower extremity edema    Tobacco abuse    Past Surgical History:  Procedure Laterality Date   ABDOMINAL HYSTERECTOMY     COLONOSCOPY WITH PROPOFOL N/A 05/25/2021   Procedure: COLONOSCOPY WITH PROPOFOL;  Surgeon: Lucilla Lame, MD;  Location: [facility] ENDOSCOPY;  Service: Endoscopy;  Laterality: N/A;   CYSTOSCOPY     X3   DIAGNOSTIC LAPAROSCOPY     ESOPHAGOGASTRODUODENOSCOPY (EGD) WITH PROPOFOL N/A 05/25/2021   Procedure: ESOPHAGOGASTRODUODENOSCOPY (EGD) WITH PROPOFOL;  Surgeon: Lucilla Lame, MD;  Location: [facility] ENDOSCOPY;  Service: Endoscopy;  Laterality: N/A;   MOHS SURGERY     PAROTIDECTOMY Left 06/20/2015   Procedure: PAROTIDECTOMY;  Surgeon: Margaretha Sheffield, MD;  Location: [facility] ORS;  Service: ENT;  Laterality: Left;    Allergies  Allergies  Allergen Reactions   Ciprofloxacin Rash   Penicillins Rash   Sulfamethoxazole-Trimethoprim Rash    Other Reaction: Other reaction   Cefdinir Other (See Comments)    BLACK STOOLS/YEAST INFECTION   Iodinated Contrast Media Other (See Comments)    MIGRAINE   Methocarbamol    Levaquin [Levofloxacin] Rash    History of Present Illness    Ms.  Holzman is a 66 year old female with a past medical history of hyperlipidemia, diabetes, current smoker x30+ years, chronic tachycardia, peripheral edema, who was just recently underwent evaluation for tachycardia.  She was last seen in clinic on 02/19/2022 with complaints of an elevated heart rates for some time.  Saw primary care provider 6 weeks prior to her initial visit heart rate was around 131.  She denies any palpitations, chest pain, shortness of breath.  Thyroid function test ordered by her PCP at that time.  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  Recent BMP showed patient had AKI and Lasix was stopped and Aldactone was reduced.  She is a current smoker in the house working on quitting and lost 20 pounds over the past 3 months while taking Ozempic..  On 03/19/2022 showed a long-term monitor revealed average heart rate of 97, she did have 5 beat run SVT that was noted with no significant arrhythmias.  Overall she had a benign cardiac monitor.  Echocardiogram completed on 03/21/2022 revealed LVEF 55-60%, no regional wall motion abnormalities, grade 2 diastolic dysfunction, trivial mitral regurgitation noted.  She returns to clinic today stating that she is doing well. Her peripheral edema has resolved since being restarted on furosemide.  She continues on Ozempic with weight loss.  She denies any chest pain, shortness of breath, palpitations, peripheral edema.  She also denies  any hospitalizations or visits to the emergency department.   Home Medications    Current Outpatient Medications  Medication Sig Dispense Refill   ARIPiprazole (ABILIFY) 2 MG tablet Take 1 tablet (2 mg total) by mouth daily. 90 tablet 0   atorvastatin (LIPITOR) 40 MG tablet Take 1 tablet (40 mg total) by mouth daily. 90 tablet 1   blood glucose meter kit and supplies KIT Dispense based on patient and insurance preference. Use  up to four times daily as directed. (Patient taking differently: Inject 1 each into the skin as directed. Dispense based on patient and insurance preference. Use up to four times daily as directed.) 1 each 11   budesonide (ENTOCORT EC) 3 MG 24 hr capsule Take 3 capsules (9 mg total) by mouth every morning. SCHEDULE VISIT FOR Nov 2023 90 capsule 2   diazepam (VALIUM) 10 MG tablet TAKE 1 TABLET AT BEDTIME AS NEEDED FOR ANXIETY (Patient taking differently: Take 10 mg by mouth as needed for anxiety. TAKE 1 TABLET AT BEDTIME AS NEEDED FOR ANXIETY) 30 tablet 0   DULoxetine (CYMBALTA) 30 MG capsule TAKE 1 TABLET BY MOUTH ONCE DAILY. (TO BE TAKEN WITH 60MG TABLET) (Patient taking differently: Take 30 mg by mouth daily. TAKE 1 TABLET BY MOUTH ONCE DAILY. (TO BE TAKEN WITH 60MG TABLET)) 90 capsule 1   DULoxetine (CYMBALTA) 60 MG capsule Take 1 capsule (60 mg total) by mouth daily. 90 capsule 0   ondansetron (ZOFRAN-ODT) 4 MG disintegrating tablet Take 1 tablet (4 mg total) by mouth every 8 (eight) hours as needed for nausea or vomiting. 45 tablet 11   potassium chloride SA (KLOR-CON M) 20 MEQ tablet Take 1 tablet (20 mEq total) by mouth daily. 30 tablet 3   promethazine (PHENERGAN) 12.5 MG tablet Take 1 tablet (12.5 mg total) by mouth at bedtime as needed for nausea or vomiting. 20 tablet 5   Semaglutide, 1 MG/DOSE, 4 MG/3ML SOPN Inject 1 mg into the skin once a week. 3 mL 0   spironolactone (ALDACTONE) 50 MG tablet Take 1 tablet (50 mg total) by mouth daily. 90 tablet 0   traMADol (ULTRAM) 50 MG tablet TAKE (2) TABLETS BY MOUTH EVERY SIX HOURS AS NEEDED FOR PAIN. 240 tablet 2   furosemide (LASIX) 20 MG tablet Take 1 tablet (20 mg total) by mouth daily. 90 tablet 3   No current facility-administered medications for this visit.     Family History    Family History  Problem Relation Age of Onset   Arthritis Mother    Hypertension Mother    Pancreatic cancer Maternal Aunt    Bone cancer Maternal Aunt     Diabetes Mellitus I Maternal Uncle        CAD    Vasculitis Maternal Aunt        Wegener's    She indicated that her mother is alive. She indicated that her father is deceased. She indicated that the status of her maternal uncle is unknown.  Social History    Social History   Socioeconomic History   Marital status: Married    Spouse name: Not on file   Number of children: Not on file   Years of education: Not on file   Highest education level: Not on file  Occupational History   Occupation: unemployed    Comment: lost job 04/03/2018  Tobacco Use   Smoking status: Every Day    Packs/day: 0.50    Years: 20.00    Total pack years:  10.00    Types: Cigarettes   Smokeless tobacco: Never  Vaping Use   Vaping Use: Former  Substance and Sexual Activity   Alcohol use: Yes    Comment: Rarely   Drug use: No   Sexual activity: Yes  Other Topics Concern   Not on file  Social History Narrative   Not on file   Social Determinants of Health   Financial Resource Strain: Medium Risk (09/25/2021)   Overall Financial Resource Strain (CARDIA)    Difficulty of Paying Living Expenses: Somewhat hard  Food Insecurity: No Food Insecurity (10/11/2021)   Hunger Vital Sign    Worried About Running Out of Food in the Last Year: Never true    Ran Out of Food in the Last Year: Never true  Transportation Needs: No Transportation Needs (10/11/2021)   PRAPARE - Hydrologist (Medical): No    Lack of Transportation (Non-Medical): No  Physical Activity: Insufficiently Active (10/11/2021)   Exercise Vital Sign    Days of Exercise per Week: 3 days    Minutes of Exercise per Session: 30 min  Stress: Stress Concern Present (11/11/2020)   [place]    Feeling of Stress : Rather much  Social Connections: Socially Integrated (10/11/2021)   Social Connection and Isolation Panel [NHANES]    Frequency of Communication with  Friends and Family: More than three times a week    Frequency of Social Gatherings with Friends and Family: Once a week    Attends Religious Services: More than 4 times per year    Active Member of [employer] or Organizations: Yes    Attends Music therapist: More than 4 times per year    Marital Status: Married  Human resources officer Violence: Not At Risk (10/11/2021)   Humiliation, Afraid, Rape, and Kick questionnaire    Fear of Current or Ex-Partner: No    Emotionally Abused: No    Physically Abused: No    Sexually Abused: No     Review of Systems    General:  No chills, fever, night sweats or weight changes.  Cardiovascular:  No chest pain, dyspnea on exertion, edema, orthopnea, palpitations, paroxysmal nocturnal dyspnea. Dermatological: No rash, lesions/masses Respiratory: No cough, dyspnea Urologic: No hematuria, dysuria Abdominal:   No nausea, vomiting, diarrhea, bright red blood per rectum, melena, or hematemesis Neurologic:  No visual changes, wkns, changes in mental status. All other systems reviewed and are otherwise negative except as noted above.     Physical Exam    VS:  BP 122/64 (BP Location: Left Arm)   Pulse 88   Ht 5' 0.25" (1.53 m)   Wt 134 lb 9.6 oz (61.1 kg)   SpO2 97%   BMI 26.07 kg/m  , BMI Body mass index is 26.07 kg/m.     GEN: Well nourished, well developed, in no acute distress. HEENT: normal. Neck: Supple, no JVD, carotid bruits, or masses. Cardiac: RRR, no murmurs, rubs, or gallops. No clubbing, cyanosis, edema.  Radials/DP/PT 2+ and equal bilaterally.  Respiratory:  Respirations regular and unlabored, clear to auscultation bilaterally. GI: Soft, nontender, nondistended, BS + x 4. MS: no deformity or atrophy. Skin: warm and dry, no rash. Neuro:  Strength and sensation are intact. Psych: Normal affect.  Accessory Clinical Findings    ECG personally reviewed by me today-sinus rhythm with a rate of 79 LVH- No acute changes  Lab Results   Component Value Date  WBC 8.8 03/28/2022   HGB 15.9 (H) 03/28/2022   HCT 46.7 (H) 03/28/2022   MCV 97.7 03/28/2022   PLT 208.0 03/28/2022   Lab Results  Component Value Date   CREATININE 0.89 03/28/2022   BUN 11 03/28/2022   NA 134 (L) 03/28/2022   K 3.9 03/28/2022   CL 94 (L) 03/28/2022   CO2 28 03/28/2022   Lab Results  Component Value Date   ALT 23 03/28/2022   AST 19 03/28/2022   ALKPHOS 116 03/28/2022   BILITOT 0.3 03/28/2022   Lab Results  Component Value Date   CHOL 169 03/28/2022   HDL 101.50 03/28/2022   LDLCALC 52 03/28/2022   LDLDIRECT 79.0 11/19/2016   TRIG 80.0 03/28/2022   CHOLHDL 2 03/28/2022    Lab Results  Component Value Date   HGBA1C 6.0 03/28/2022    Assessment & Plan   1.  Longstanding tachycardia with a EKG today revealing a rate of 78.  She previously had Holter monitor with average heart rate of 100.  She continues to have lower heart rates with weight loss.  She is asymptomatic.  Did discuss the potential of taking AV nodal agents and she would like to defer at this time as he only complaint that she has currently is of exertional fatigue.  Holter monitor revealed normal heart rates of 97 and 1 5 beat run of SVT that was noted with no other significant arrhythmias overall is benign cardiac monitor.  Echocardiogram revealed normal systolic function, relaxing function appears impaired which may be secondary to comorbidities such as diabetes, overall echocardiogram is stable.  2.  Hyperlipidemia with LDL of 52 on 03/28/2022.  She has been continued on atorvastatin 40 mg daily.  3.  Peripheral edema that is now resolved after restarting furosemide 20 mg daily.  BMP was repeated by her PCP which shows stable electrolytes and kidney function.  She is requesting refill of furosemide 20 mg daily to be sent to the [facility] and needs to have a prior request for 90 days.  4.  She is a current smoker with total cessation recommended  5.  Disposition  patient return to clinic in 3 months for MD/APP or sooner if needed.  Cassandria Drew, NP 05/11/2022, 2:40 PM

## 2022-05-11 ENCOUNTER — Encounter: Payer: Self-pay | Admitting: Cardiology

## 2022-05-11 ENCOUNTER — Ambulatory Visit: Payer: Medicare Other | Attending: Cardiology | Admitting: Cardiology

## 2022-05-11 VITALS — BP 122/64 | HR 88 | Ht 60.25 in | Wt 134.6 lb

## 2022-05-11 DIAGNOSIS — R600 Localized edema: Secondary | ICD-10-CM | POA: Insufficient documentation

## 2022-05-11 DIAGNOSIS — E785 Hyperlipidemia, unspecified: Secondary | ICD-10-CM | POA: Diagnosis not present

## 2022-05-11 DIAGNOSIS — R000 Tachycardia, unspecified: Secondary | ICD-10-CM | POA: Diagnosis not present

## 2022-05-11 DIAGNOSIS — F17200 Nicotine dependence, unspecified, uncomplicated: Secondary | ICD-10-CM | POA: Insufficient documentation

## 2022-05-11 MED ORDER — FUROSEMIDE 20 MG PO TABS: 20.0000 mg | ORAL_TABLET | Freq: Every day | ORAL | 3 refills | Status: DC

## 2022-05-11 NOTE — Patient Instructions (Signed)
Medication Instructions:  No changes at this time.   *If you need a refill on your cardiac medications before your next appointment, please call your pharmacy*   Lab Work: None  If you have labs (blood work) drawn today and your tests are completely normal, you will receive your results only by: MyChart Message (if you have MyChart) OR A paper copy in the mail If you have any lab test that is abnormal or we need to change your treatment, we will call you to review the results.   Testing/Procedures: None   Follow-Up: At [facility], you and your health needs are our priority.  As part of our continuing mission to provide you with exceptional heart care, we have created designated Provider Care Teams.  These Care Teams include your primary Cardiologist (physician) and Advanced Practice Providers (APPs -  Physician Assistants and Nurse Practitioners) who all work together to provide you with the care you need, when you need it.   Your next appointment:   3 month(s)  The format for your next appointment:   In Person  Provider:   Brian Agbor-Etang, MD or Sheri Hammock, NP        Important Information About Sugar       

## 2022-05-14 ENCOUNTER — Telehealth: Payer: Self-pay | Admitting: Pharmacy Technician

## 2022-05-14 DIAGNOSIS — Z596 Low income: Secondary | ICD-10-CM

## 2022-05-14 NOTE — Progress Notes (Addendum)
[facility])                                            [place] Team    05/14/2022  Kristie Jones 1955-11-16 [id number]                                      Medication Assistance Referral-FOR 2024 RE ENROLLMENT  Referral From:  [facility]  Curlene Labrum  Medication/Company: [employer] / [employer] Patient application portion:  Mailed Provider application portion: Faxed  to Dr. Deborra Medina Provider address/fax verified via: Office website    Gilda Abboud P. Danaysha Kirn, [place]  [phone]

## 2022-05-16 IMAGING — CR DG CHEST 2V
1 series · 2 of 2 positions shown · non-contrast
Comparison: None.

CLINICAL DATA: Polycythemia.  Rule out lung disease.

EXAM:
CHEST - 2 VIEW

[Series 1: dg chest 2 view · 0.14mm/px · 2 of 2 slices shown]
[im 1/2]
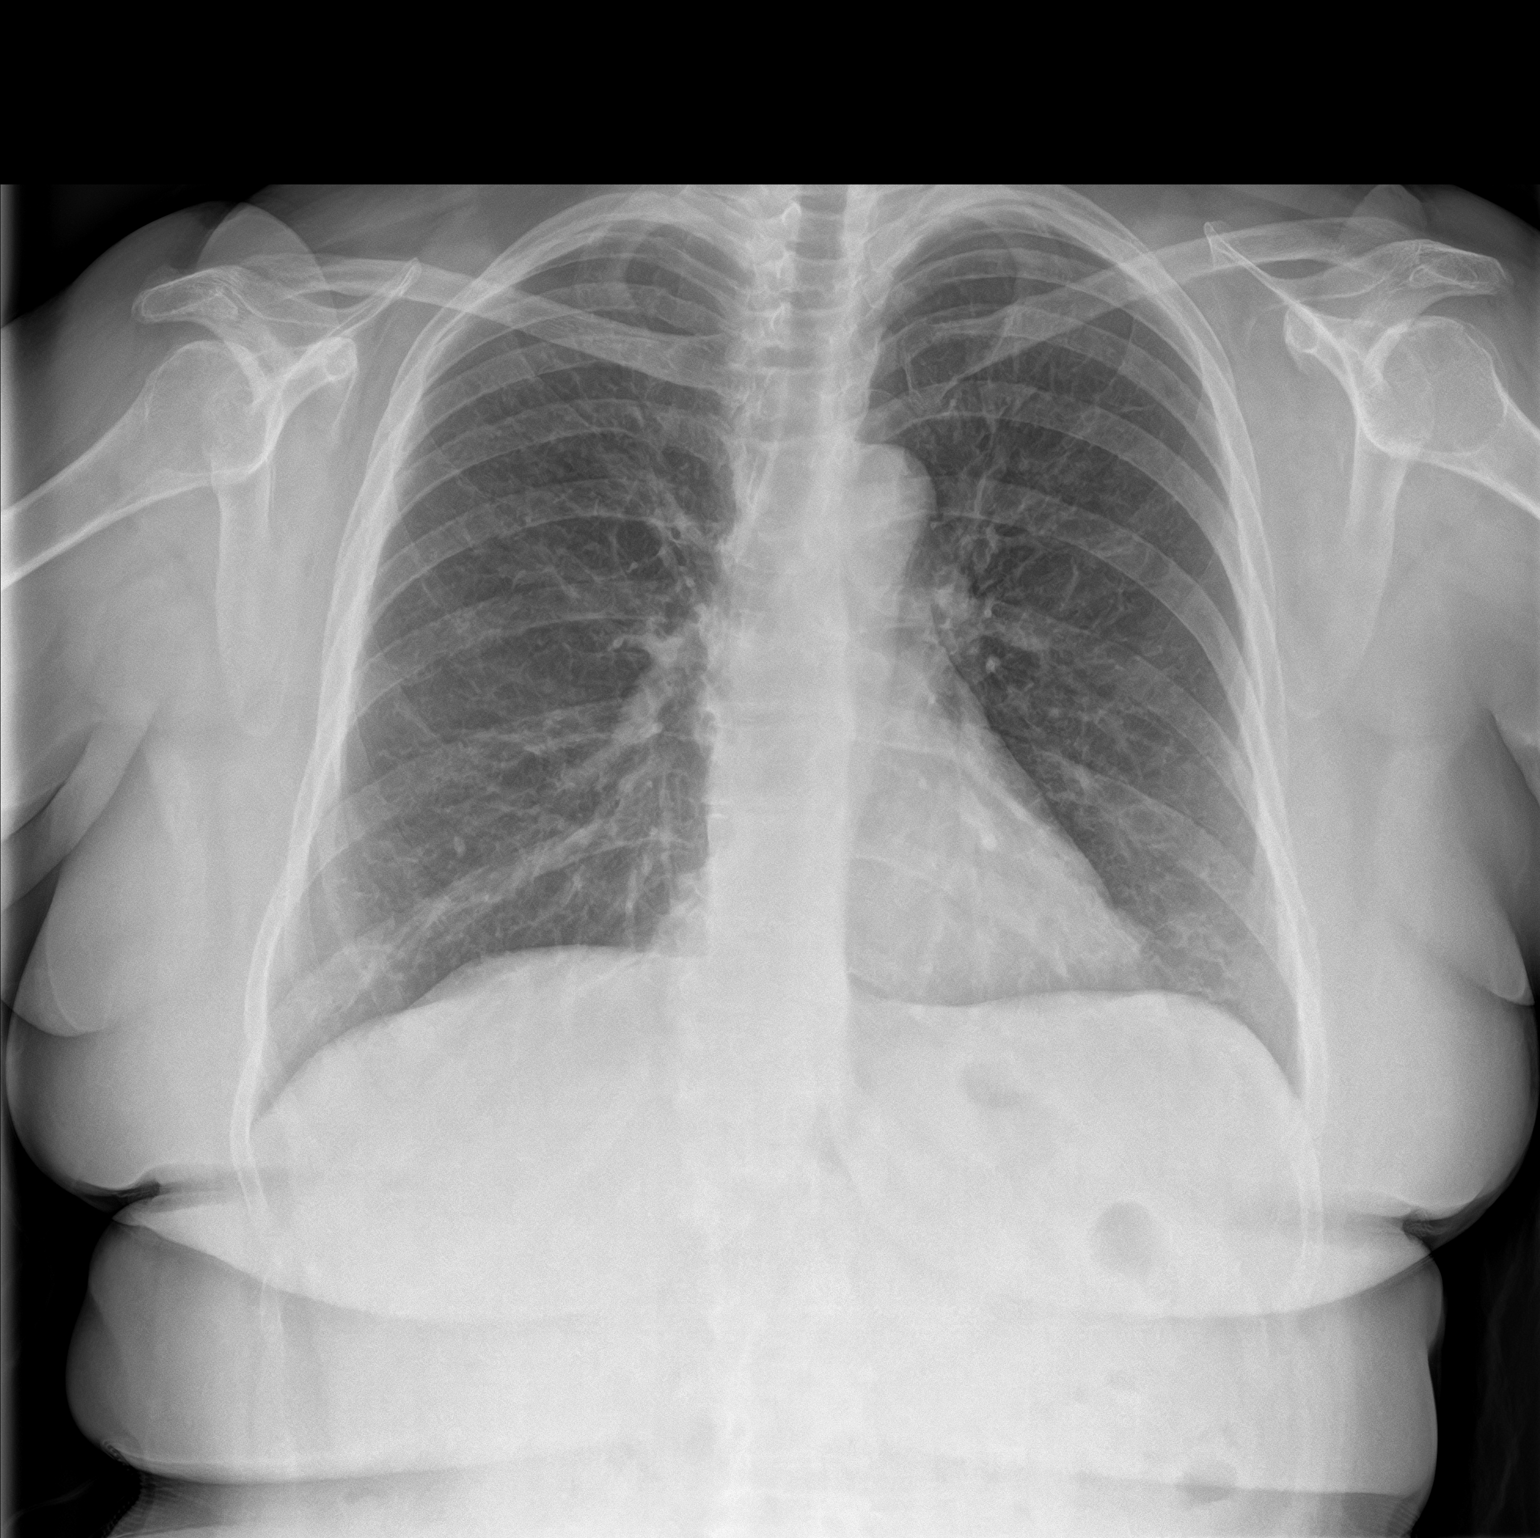
[im 2/2]
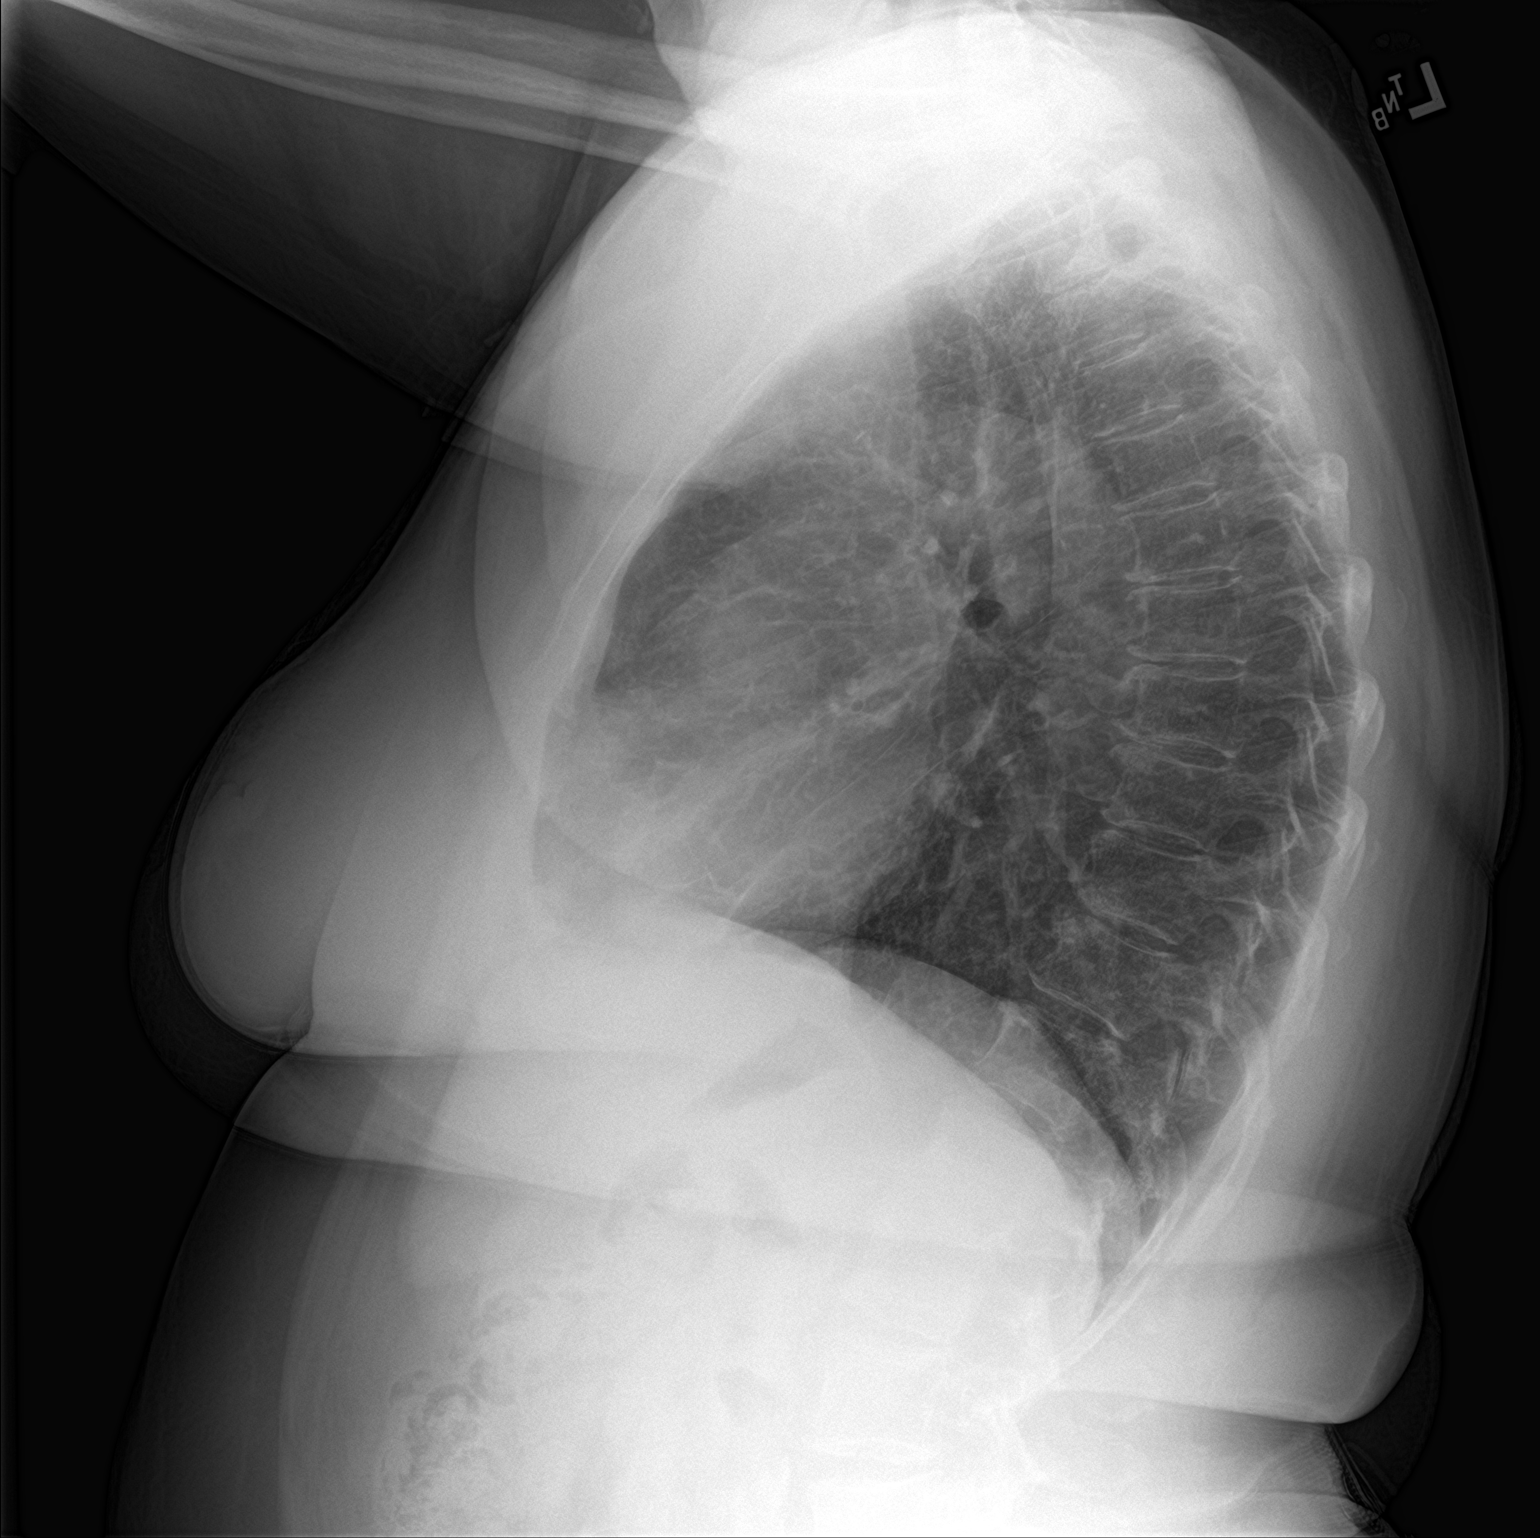

[2 of 2 positions shown; findings below may reference images not displayed]

FINDINGS: The heart is normal in size.The cardiomediastinal contours are
normal. The lungs are clear. Pulmonary vasculature is normal. No
consolidation, pleural effusion, or pneumothorax. Mild thoracic
spondylosis with endplate spurring. No acute osseous abnormalities
are seen.
IMPRESSION: Unremarkable radiographs of the chest. No evidence of acute or
chronic pulmonary process.

## 2022-05-28 ENCOUNTER — Other Ambulatory Visit: Payer: Self-pay | Admitting: Internal Medicine

## 2022-05-29 ENCOUNTER — Telehealth: Payer: Self-pay | Admitting: Internal Medicine

## 2022-05-29 NOTE — Telephone Encounter (Signed)
Called to notify pt that her Ozempic from Patient Assistance Program has been delivered and is ready to be picked up.  Pt reported that she would pick up.

## 2022-06-11 ENCOUNTER — Other Ambulatory Visit: Payer: Self-pay | Admitting: Internal Medicine

## 2022-06-11 DIAGNOSIS — Z760 Encounter for issue of repeat prescription: Secondary | ICD-10-CM

## 2022-06-11 NOTE — Telephone Encounter (Signed)
Refilled: 03/14/2022 Last OV: 03/28/2022 Next OV: not scheduled

## 2022-07-04 ENCOUNTER — Inpatient Hospital Stay: Payer: Medicare Other

## 2022-07-05 ENCOUNTER — Telehealth: Payer: Self-pay

## 2022-07-05 NOTE — Telephone Encounter (Signed)
LVM to inform pt that she can pick up her Ozempic 1 mg (4boxes) from pt assistance anytime between 1pm -5pm M-F

## 2022-07-05 NOTE — Telephone Encounter (Signed)
Pt called stating her husband already picked up her medicine yesterday. Pt wanted to know was there another box if didn't get

## 2022-07-09 ENCOUNTER — Other Ambulatory Visit: Payer: Self-pay | Admitting: Internal Medicine

## 2022-07-09 NOTE — Telephone Encounter (Signed)
Spoke to patient and she stated that she will come to pick up medication between 1pm -5pm

## 2022-07-21 DIAGNOSIS — Z85820 Personal history of malignant melanoma of skin: Secondary | ICD-10-CM | POA: Insufficient documentation

## 2022-07-23 NOTE — Telephone Encounter (Signed)
Pt has picked up medication.  

## 2022-07-24 DIAGNOSIS — D229 Melanocytic nevi, unspecified: Secondary | ICD-10-CM | POA: Diagnosis not present

## 2022-07-24 DIAGNOSIS — L814 Other melanin hyperpigmentation: Secondary | ICD-10-CM | POA: Diagnosis not present

## 2022-07-24 DIAGNOSIS — L820 Inflamed seborrheic keratosis: Secondary | ICD-10-CM | POA: Diagnosis not present

## 2022-07-24 DIAGNOSIS — Z85828 Personal history of other malignant neoplasm of skin: Secondary | ICD-10-CM | POA: Diagnosis not present

## 2022-07-24 DIAGNOSIS — Z85820 Personal history of malignant melanoma of skin: Secondary | ICD-10-CM | POA: Diagnosis not present

## 2022-07-24 DIAGNOSIS — L570 Actinic keratosis: Secondary | ICD-10-CM | POA: Diagnosis not present

## 2022-07-27 ENCOUNTER — Other Ambulatory Visit: Payer: Self-pay | Admitting: Internal Medicine

## 2022-07-27 NOTE — Telephone Encounter (Signed)
Requesting: Diazepam Contract: No UDS: NO Last Visit: 03/28/2022 Next Visit: Visit date not found Last Refill: 02/26/2022  Please Advise

## 2022-07-31 DIAGNOSIS — U071 COVID-19: Secondary | ICD-10-CM | POA: Diagnosis not present

## 2022-07-31 DIAGNOSIS — R519 Headache, unspecified: Secondary | ICD-10-CM | POA: Diagnosis not present

## 2022-08-01 ENCOUNTER — Other Ambulatory Visit: Payer: Self-pay | Admitting: Internal Medicine

## 2022-08-01 ENCOUNTER — Telehealth: Payer: Self-pay | Admitting: Internal Medicine

## 2022-08-01 NOTE — Telephone Encounter (Signed)
Pt called stating that her husband tested positive for Covid yesterday and she's negative. she would like to know if she can take  ARIPiprazole (ABILIFY) 2 MG tablet and Paxloid at the same time.

## 2022-08-01 NOTE — Telephone Encounter (Signed)
Called pt and she stated that she didn't even pick up the Paxlovid. Advised pt that per verbal from Dr. Derrel Nip she could call in the [facility] which is the other antiviral. Pt stated that she would have to look on Goodrx to find it the cheapest since she does not have insurance. Pt asked that we send her the name of the medication through mychart. Name has been sent waiting on response from pt.

## 2022-08-02 ENCOUNTER — Other Ambulatory Visit: Payer: Self-pay | Admitting: Internal Medicine

## 2022-08-02 ENCOUNTER — Telehealth: Payer: Self-pay | Admitting: Internal Medicine

## 2022-08-02 DIAGNOSIS — F419 Anxiety disorder, unspecified: Secondary | ICD-10-CM

## 2022-08-02 MED ORDER — MOLNUPIRAVIR EUA 200MG CAPSULE: 4.0000 | ORAL_CAPSULE | Freq: Two times a day (BID) | ORAL | 0 refills | Status: AC

## 2022-08-02 NOTE — Telephone Encounter (Signed)
Pt can get the Molnupiravir from [facility] for free. She would like to have the rx sent in.

## 2022-08-02 NOTE — Telephone Encounter (Signed)
Pt called stating that she needs a prescription for med Molnupiravir so she can have it for free from the government.

## 2022-08-02 NOTE — Telephone Encounter (Signed)
Pt would like the Molnupiravir sent to [facility]

## 2022-08-02 NOTE — Telephone Encounter (Signed)
Pt is aware.  

## 2022-08-13 ENCOUNTER — Ambulatory Visit: Payer: Medicare Other | Admitting: Cardiology

## 2022-08-31 ENCOUNTER — Other Ambulatory Visit: Payer: Self-pay | Admitting: Internal Medicine

## 2022-08-31 DIAGNOSIS — Z760 Encounter for issue of repeat prescription: Secondary | ICD-10-CM

## 2022-09-13 ENCOUNTER — Telehealth: Payer: Self-pay | Admitting: Pharmacy Technician

## 2022-09-13 DIAGNOSIS — Z596 Low income: Secondary | ICD-10-CM

## 2022-09-13 NOTE — Progress Notes (Signed)
[place] [facility])                                            [place] Team    09/13/2022  Kristie Jones 18-Jan-1956 [id number]  Received both patient and provider portion(s) of patient assistance application(s) for Ozempic. Faxed completed application and required documents into [employer].    [name] [name], [place]  [phone]

## 2022-09-17 ENCOUNTER — Telehealth: Payer: Self-pay | Admitting: Pharmacy Technician

## 2022-09-17 DIAGNOSIS — Z596 Low income: Secondary | ICD-10-CM

## 2022-09-17 NOTE — Progress Notes (Signed)
[place] [facility])                                            [name]    09/17/2022  Kristie Jones Oct 10, 1955 [id number]  Care coordination call placed to [name] in regard to [name] application.   Spoke to [name] who informs current enrollment does not end until 09/23/22 However, patient is APPROVED 09/24/22-09/19/23. Medication will automatically fill and ship to provider's address on file based on last fill date in 2023. Patient may call [name] at [phone] if shipment has not arrived and patient does not have sufficient supply  [name] P. Dayan Desa, [place]  [phone]

## 2022-10-04 ENCOUNTER — Telehealth: Payer: Self-pay

## 2022-10-04 NOTE — Telephone Encounter (Signed)
Spoke with pt to let her know that we have received her patient assistance medication in the office and it is ready for pick up. Pt gave a verbal understanding.    Ozempic: 4 boxes

## 2022-10-08 ENCOUNTER — Telehealth: Payer: Self-pay | Admitting: Internal Medicine

## 2022-10-08 NOTE — Telephone Encounter (Signed)
Contacted Jinger B Milne to schedule their annual wellness visit. Appointment made for 10/18/2022.  Thank you,  [place] Direct dial  [phone]

## 2022-10-17 NOTE — Telephone Encounter (Signed)
Medication has been picked up.  

## 2022-10-17 NOTE — Telephone Encounter (Signed)
Pt called in staying that her husband(Joseph Melone) will pick up pt med today.

## 2022-10-18 ENCOUNTER — Ambulatory Visit (INDEPENDENT_AMBULATORY_CARE_PROVIDER_SITE_OTHER): Payer: Medicare Other

## 2022-10-18 VITALS — Ht 60.25 in | Wt 130.0 lb

## 2022-10-18 DIAGNOSIS — Z1231 Encounter for screening mammogram for malignant neoplasm of breast: Secondary | ICD-10-CM

## 2022-10-18 DIAGNOSIS — Z0000 Encounter for general adult medical examination without abnormal findings: Secondary | ICD-10-CM

## 2022-10-18 NOTE — Patient Instructions (Addendum)
Ms. Dingmann , Thank you for taking time to come for your Medicare Wellness Visit. I appreciate your ongoing commitment to your health goals. Please review the following plan we discussed and let me know if I can assist you in the future.   These are the goals we discussed:  Goals Addressed               This Visit's Progress     Patient Stated     Walk more for exercise (pt-stated)        Maintain weight          This is a list of the screening recommended for you and due dates:  Health Maintenance  Topic Date Due   Eye exam for diabetics  Never done   Mammogram  Never done   Yearly kidney health urinalysis for diabetes  08/28/2022   DTaP/Tdap/Td vaccine (2 - Td or Tdap) 10/10/2022   Hemoglobin A1C  09/28/2022   Flu Shot  11/04/2022*   Zoster (Shingles) Vaccine (1 of 2) 01/18/2023*   Yearly kidney function blood test for diabetes  03/29/2023   Complete foot exam   03/29/2023   Medicare Annual Wellness Visit  10/18/2023   Colon Cancer Screening  05/26/2031   Hepatitis C Screening: USPSTF Recommendation to screen - Ages 18-79 yo.  Completed   HPV Vaccine  Aged Out   COVID-19 Vaccine  Discontinued   HIV Screening  Discontinued  *Topic was postponed. The date shown is not the original due date.   Mammogram- order placed. Number provided for scheduling.  Call this number [phone] to schedule.  Next appointment: Follow up in one year for your annual wellness visit.   Preventive Care 40-64 Years, Female Preventive care refers to lifestyle choices and visits with your health care provider that can promote health and wellness. What does preventive care include? A yearly physical exam. This is also called an annual well check. Dental exams once or twice a year. Routine eye exams. Ask your health care provider how often you should have your eyes checked. Personal lifestyle choices, including: Daily care of your teeth and gums. Regular physical activity. Eating a healthy  diet. Avoiding tobacco and drug use. Limiting alcohol use. Practicing safe sex. Taking low-dose aspirin daily starting at age 59. Taking vitamin and mineral supplements as recommended by your health care provider. What happens during an annual well check? The services and screenings done by your health care provider during your annual well check will depend on your age, overall health, lifestyle risk factors, and family history of disease. Counseling  Your health care provider may ask you questions about your: Alcohol use. Tobacco use. Drug use. Emotional well-being. Home and relationship well-being. Sexual activity. Eating habits. Work and work Statistician. Method of birth control. Menstrual cycle. Pregnancy history. Screening  You may have the following tests or measurements: Height, weight, and BMI. Blood pressure. Lipid and cholesterol levels. These may be checked every 5 years, or more frequently if you are over 65 years old. Skin check. Lung cancer screening. You may have this screening every year starting at age 47 if you have a 30-pack-year history of smoking and currently smoke or have quit within the past 15 years. Fecal occult blood test (FOBT) of the stool. You may have this test every year starting at age 21. Flexible sigmoidoscopy or colonoscopy. You may have a sigmoidoscopy every 5 years or a colonoscopy every 10 years starting at age 71. Hepatitis C blood test.  Hepatitis B blood test. Sexually transmitted disease (STD) testing. Diabetes screening. This is done by checking your blood sugar (glucose) after you have not eaten for a while (fasting). You may have this done every 1-3 years. Mammogram. This may be done every 1-2 years. Talk to your health care provider about when you should start having regular mammograms. This may depend on whether you have a family history of breast cancer. BRCA-related cancer screening. This may be done if you have a family history of  breast, ovarian, tubal, or peritoneal cancers. Pelvic exam and Pap test. This may be done every 3 years starting at age 70. Starting at age 17, this may be done every 5 years if you have a Pap test in combination with an HPV test. Bone density scan. This is done to screen for osteoporosis. You may have this scan if you are at high risk for osteoporosis. Discuss your test results, treatment options, and if necessary, the need for more tests with your health care provider. Vaccines  Your health care provider may recommend certain vaccines, such as: Influenza vaccine. This is recommended every year. Tetanus, diphtheria, and acellular pertussis (Tdap, Td) vaccine. You may need a Td booster every 10 years. Zoster vaccine. You may need this after age 68. Pneumococcal 13-valent conjugate (PCV13) vaccine. You may need this if you have certain conditions and were not previously vaccinated. Pneumococcal polysaccharide (PPSV23) vaccine. You may need one or two doses if you smoke cigarettes or if you have certain conditions. Talk to your health care provider about which screenings and vaccines you need and how often you need them. This information is not intended to replace advice given to you by your health care provider. Make sure you discuss any questions you have with your health care provider. Document Released: 08/19/2015 Document Revised: 04/11/2016 Document Reviewed: 05/24/2015 Elsevier Interactive Patient Education  2017 [place] Prevention in the Home Falls can cause injuries. They can happen to people of all ages. There are many things you can do to make your home safe and to help prevent falls. What can I do on the outside of my home? Regularly fix the edges of walkways and driveways and fix any cracks. Remove anything that might make you trip as you walk through a door, such as a raised step or threshold. Trim any bushes or trees on the path to your home. Use bright outdoor  lighting. Clear any walking paths of anything that might make someone trip, such as rocks or tools. Regularly check to see if handrails are loose or broken. Make sure that both sides of any steps have handrails. Any raised decks and porches should have guardrails on the edges. Have any leaves, snow, or ice cleared regularly. Use sand or salt on walking paths during winter. Clean up any spills in your garage right away. This includes oil or grease spills. What can I do in the bathroom? Use night lights. Install grab bars by the toilet and in the tub and shower. Do not use towel bars as grab bars. Use non-skid mats or decals in the tub or shower. If you need to sit down in the shower, use a plastic, non-slip stool. Keep the floor dry. Clean up any water that spills on the floor as soon as it happens. Remove soap buildup in the tub or shower regularly. Attach bath mats securely with double-sided non-slip rug tape. Do not have throw rugs and other things on the floor that  can make you trip. What can I do in the bedroom? Use night lights. Make sure that you have a light by your bed that is easy to reach. Do not use any sheets or blankets that are too big for your bed. They should not hang down onto the floor. Have a firm chair that has side arms. You can use this for support while you get dressed. Do not have throw rugs and other things on the floor that can make you trip. What can I do in the kitchen? Clean up any spills right away. Avoid walking on wet floors. Keep items that you use a lot in easy-to-reach places. If you need to reach something above you, use a strong step stool that has a grab bar. Keep electrical cords out of the way. Do not use floor polish or wax that makes floors slippery. If you must use wax, use non-skid floor wax. Do not have throw rugs and other things on the floor that can make you trip. What can I do with my stairs? Do not leave any items on the stairs. Make  sure that there are handrails on both sides of the stairs and use them. Fix handrails that are broken or loose. Make sure that handrails are as long as the stairways. Check any carpeting to make sure that it is firmly attached to the stairs. Fix any carpet that is loose or worn. Avoid having throw rugs at the top or bottom of the stairs. If you do have throw rugs, attach them to the floor with carpet tape. Make sure that you have a light switch at the top of the stairs and the bottom of the stairs. If you do not have them, ask someone to add them for you. What else can I do to help prevent falls? Wear shoes that: Do not have high heels. Have rubber bottoms. Are comfortable and fit you well. Are closed at the toe. Do not wear sandals. If you use a stepladder: Make sure that it is fully opened. Do not climb a closed stepladder. Make sure that both sides of the stepladder are locked into place. Ask someone to hold it for you, if possible. Clearly mark and make sure that you can see: Any grab bars or handrails. First and last steps. Where the edge of each step is. Use tools that help you move around (mobility aids) if they are needed. These include: Canes. Walkers. Scooters. Crutches. Turn on the lights when you go into a dark area. Replace any light bulbs as soon as they burn out. Set up your furniture so you have a clear path. Avoid moving your furniture around. If any of your floors are uneven, fix them. If there are any pets around you, be aware of where they are. Review your medicines with your doctor. Some medicines can make you feel dizzy. This can increase your chance of falling. Ask your doctor what other things that you can do to help prevent falls. This information is not intended to replace advice given to you by your health care provider. Make sure you discuss any questions you have with your health care provider. Document Released: 05/19/2009 Document Revised: 12/29/2015  Document Reviewed: 08/27/2014 Elsevier Interactive Patient Education  2017 [employer].

## 2022-10-18 NOTE — Progress Notes (Addendum)
Subjective:   Kristie Jones is a 67 y.o. female who presents for Medicare Annual (Subsequent) preventive examination.  Review of Systems    No ROS.  Medicare Wellness Virtual Visit.  Visual/audio telehealth visit, UTA vital signs.   See social history for additional risk factors.   Cardiac Risk Factors include: advanced age (>65mn, >>25women);diabetes mellitus     Objective:    Today's Vitals   10/18/22 0931  Weight: 130 lb (59 kg)  Height: 5' 0.25" (1.53 m)   Body mass index is 25.18 kg/m.     10/18/2022    9:48 AM 10/11/2021    9:21 AM 05/25/2021    9:56 AM 12/06/2020   11:23 AM 11/22/2020    1:41 PM 05/09/2016    8:16 AM 06/20/2015   11:17 AM  Advanced Directives  Does Patient Have a Medical Advance Directive? No No No No No No No  Does patient want to make changes to medical advance directive? No - Patient declined        Would patient like information on creating a medical advance directive?  No - Patient declined  No - Patient declined  Yes - Educational materials given Yes - Spiritual care consult ordered    Current Medications (verified) Outpatient Encounter Medications as of 10/18/2022  Medication Sig   ARIPiprazole (ABILIFY) 2 MG tablet TAKE ONE TABLET BY MOUTH EVERY DAY   atorvastatin (LIPITOR) 40 MG tablet TAKE 1 TABLET BY MOUTH ONCE DAILY.   blood glucose meter kit and supplies KIT Dispense based on patient and insurance preference. Use up to four times daily as directed. (Patient taking differently: Inject 1 each into the skin as directed. Dispense based on patient and insurance preference. Use up to four times daily as directed.)   budesonide (ENTOCORT EC) 3 MG 24 hr capsule Take 3 capsules (9 mg total) by mouth every morning. SCHEDULE VISIT FOR Nov 2023   diazepam (VALIUM) 10 MG tablet TAKE 1 TABLET AT BEDTIME AS NEEDED FOR ANXIETY   DULoxetine (CYMBALTA) 30 MG capsule TAKE 1 TABLET BY MOUTH ONCE DAILY. (TO BE TAKEN WITH '60MG'$  TABLET)   DULoxetine (CYMBALTA)  60 MG capsule TAKE ONE CAPSULE BY MOUTH EVERY DAY   furosemide (LASIX) 20 MG tablet Take 1 tablet (20 mg total) by mouth daily.   ondansetron (ZOFRAN-ODT) 4 MG disintegrating tablet Take 1 tablet (4 mg total) by mouth every 8 (eight) hours as needed for nausea or vomiting.   potassium chloride SA (KLOR-CON M) 20 MEQ tablet TAKE 1 TABLET BY MOUTH ONCE DAILY.   promethazine (PHENERGAN) 12.5 MG tablet Take 1 tablet (12.5 mg total) by mouth at bedtime as needed for nausea or vomiting.   Semaglutide, 1 MG/DOSE, 4 MG/3ML SOPN Inject 1 mg into the skin once a week.   spironolactone (ALDACTONE) 50 MG tablet TAKE ONE TABLET BY MOUTH EVERY DAY   traMADol (ULTRAM) 50 MG tablet TAKE TWO TABLETS BY MOUTH EVERY 6 HOURS AS NEEDED FOR PAIN   No facility-administered encounter medications on file as of 10/18/2022.    Allergies (verified) Ciprofloxacin, Penicillins, Sulfamethoxazole-trimethoprim, Cefdinir, Iodinated contrast media, Methocarbamol, and Levaquin [levofloxacin]   History: Past Medical History:  Diagnosis Date   Allergy    Arthritis    [place]    MULTIPLE SQUAMOUS CELL-WRIST, BUTTOCKS, ABDOMEN, LIP   Chicken pox    Complication of anesthesia    PT GETS VERY ANXIOUS PRIOR TO ANESTHESIA AND WILL START JERKING MASK OFF   Depression  Diabetes mellitus without complication ([place])    Endometriosis    Headache    Hyperlipidemia associated with type 2 diabetes mellitus (HCC)    Lower extremity edema    Tobacco abuse    Past Surgical History:  Procedure Laterality Date   ABDOMINAL HYSTERECTOMY     COLONOSCOPY WITH PROPOFOL N/A 05/25/2021   Procedure: COLONOSCOPY WITH PROPOFOL;  Surgeon: Lucilla Lame, MD;  Location: [facility] ENDOSCOPY;  Service: Endoscopy;  Laterality: N/A;   CYSTOSCOPY     X3   DIAGNOSTIC LAPAROSCOPY     ESOPHAGOGASTRODUODENOSCOPY (EGD) WITH PROPOFOL N/A 05/25/2021   Procedure: ESOPHAGOGASTRODUODENOSCOPY (EGD) WITH PROPOFOL;  Surgeon: Lucilla Lame, MD;  Location: [facility]  ENDOSCOPY;  Service: Endoscopy;  Laterality: N/A;   MOHS SURGERY     PAROTIDECTOMY Left 06/20/2015   Procedure: PAROTIDECTOMY;  Surgeon: Margaretha Sheffield, MD;  Location: [facility] ORS;  Service: ENT;  Laterality: Left;   Family History  Problem Relation Age of Onset   Arthritis Mother    Hypertension Mother    Pancreatic cancer Maternal Aunt    Bone cancer Maternal Aunt    Diabetes Mellitus I Maternal Uncle        CAD    Vasculitis Maternal Aunt        Wegener's    Social History   Socioeconomic History   Marital status: Married    Spouse name: Not on file   Number of children: Not on file   Years of education: Not on file   Highest education level: Not on file  Occupational History   Occupation: unemployed    Comment: lost job 04/03/2018  Tobacco Use   Smoking status: Every Day    Packs/day: 0.50    Years: 20.00    Additional pack years: 0.00    Total pack years: 10.00    Types: Cigarettes   Smokeless tobacco: Never  Vaping Use   Vaping Use: Former  Substance and Sexual Activity   Alcohol use: Yes    Comment: Rarely   Drug use: No   Sexual activity: Yes  Other Topics Concern   Not on file  Social History Narrative   Not on file   Social Determinants of Health   Financial Resource Strain: Low Risk  (10/17/2022)   Overall Financial Resource Strain (CARDIA)    Difficulty of Paying Living Expenses: Not very hard  Food Insecurity: No Food Insecurity (10/17/2022)   Hunger Vital Sign    Worried About Running Out of Food in the Last Year: Never true    Ran Out of Food in the Last Year: Never true  Transportation Needs: No Transportation Needs (10/17/2022)   PRAPARE - Hydrologist (Medical): No    Lack of Transportation (Non-Medical): No  Physical Activity: Insufficiently Active (10/17/2022)   Exercise Vital Sign    Days of Exercise per Week: 2 days    Minutes of Exercise per Session: 30 min  Stress: Stress Concern Present (10/17/2022)   [place]    Feeling of Stress : To some extent  Social Connections: Unknown (10/17/2022)   Social Connection and Isolation Panel [NHANES]    Frequency of Communication with Friends and Family: More than three times a week    Frequency of Social Gatherings with Friends and Family: Patient declined    Attends Religious Services: Not on Diplomatic Services operational officer of Clubs or Organizations: No    Attends Archivist Meetings:  Patient declined    Marital Status: Married    Tobacco Counseling Ready to quit: Not Answered Counseling given: Not Answered   Clinical Intake:  Pre-visit preparation completed: Yes        Diabetes: Yes (Followed by pcp)  How often do you need to have someone help you when you read instructions, pamphlets, or other written materials from your doctor or pharmacy?: 1 - Never  Nutrition Risk Assessment: Does the patient have any non-healing wounds?  No  Has the patient had any unintentional weight loss or weight gain?  No   Diabetes: Is the patient diabetic?  Yes  If diabetic, was a CBG obtained today?  Yes , FBS 108 Did the patient bring in their glucometer from home?  No  How often do you monitor your CBG's? Daily.   Financial Strains and Diabetes Management: Are you having any financial strains with the device, your supplies or your medication? No .  Does the patient want to be seen by Chronic Care Management for management of their diabetes?  No  Would the patient like to be referred to a Nutritionist or for Diabetic Management?  No     Interpreter Needed?: No      Activities of Daily Living    10/17/2022    8:34 PM  In your present state of health, do you have any difficulty performing the following activities:  Hearing? 0  Vision? 0  Difficulty concentrating or making decisions? 0  Walking or climbing stairs? 0  Dressing or bathing? 0  Doing errands, shopping? 0  Preparing  Food and eating ? N  Using the Toilet? N  In the past six months, have you accidently leaked urine? Y  Comment Managed with daily pad.  Do you have problems with loss of bowel control? N  Managing your Medications? N  Managing your Finances? N  Housekeeping or managing your Housekeeping? Y  Comment Paces self with activity    Patient Care Team: Crecencio Mc, MD as PCP - General (Internal Medicine) Kate Sable, MD as PCP - Cardiology (Cardiology)  Indicate any recent Medical Services you may have received from other than [facility] providers in the past year (date may be approximate).     Assessment:   This is a routine wellness examination for Kristie Jones.  Patient Medicare AWV questionnaire was completed by the patient on 10/17/22 , I have confirmed that all information answered by patient is correct and no changes since this date.   I connected with  Kristie Jones on 10/18/22 by a audio enabled telemedicine application and verified that I am speaking with the correct person using two identifiers.  Patient Location: Home  Provider Location: Office/Clinic  I discussed the limitations of evaluation and management by telemedicine. The patient expressed understanding and agreed to proceed.   Hearing/Vision screen Hearing Screening - Comments:: Patient is able to hear conversational tones without difficulty.  No issues reported. Vision Screening - Comments:: Followed by [facility] Wears corrective lenses They have seen their ophthalmologist in the last 12 months.    Dietary issues and exercise activities discussed: Current Exercise Habits: Home exercise routine, Time (Minutes): 30, Frequency (Times/Week): 2, Weekly Exercise (Minutes/Week): 60, Intensity: Mild Low carb Good water intake   Goals Addressed               This Visit's Progress     Patient Stated     Walk more for exercise (pt-stated)  Maintain weight        Depression  Screen    10/18/2022    9:46 AM 03/28/2022    4:47 PM 10/11/2021   11:26 AM 08/30/2021    4:01 PM 12/02/2020    2:43 PM 08/15/2020    3:52 PM 12/02/2019    5:04 PM  PHQ 2/9 Scores  PHQ - 2 Score 0 '4 4 4 4 6 5  '$ PHQ- 9 Score  '10 8 8 16 26 20    '$ Fall Risk    10/17/2022    8:34 PM 03/28/2022    4:02 PM 10/11/2021   11:26 AM 08/30/2021    4:01 PM 12/02/2020    2:42 PM  Fall Risk   Falls in the past year? 0 0 0 0 1  Number falls in past yr: 0  0  1  Injury with Fall? 0    0  Risk for fall due to :  No Fall Risks  No Fall Risks   Follow up Falls evaluation completed;Falls prevention discussed Falls evaluation completed Falls evaluation completed Falls evaluation completed Falls evaluation completed    FALL RISK PREVENTION PERTAINING TO THE HOME: Home free of loose throw rugs in walkways, pet beds, electrical cords, etc? Yes  Adequate lighting in your home to reduce risk of falls? Yes   ASSISTIVE DEVICES UTILIZED TO PREVENT FALLS: Life alert? No  Use of a cane, walker or w/c? No  Grab bars in the bathroom? No  Shower chair or bench in shower? No  Elevated toilet seat or a handicapped toilet? No   TIMED UP AND GO: Was the test performed? No .    Cognitive Function:        10/18/2022    9:46 AM  6CIT Screen  What Year? 0 points  What month? 0 points  What time? 0 points  Count back from 20 0 points  Months in reverse 0 points  Repeat phrase 0 points  Total Score 0 points    Immunizations Immunization History  Administered Date(s) Administered   Influenza,inj,Quad PF,6+ Mos 08/15/2020, 08/30/2021   Influenza-Unspecified 04/26/2014, 05/29/2015, 06/05/2016, 05/31/2017   Janssen (J&J) SARS-COV-2 Vaccination 10/23/2019   Pneumococcal Conjugate-13 08/15/2020   Pneumococcal Polysaccharide-23 01/22/2014   Tdap 10/09/2012   TDAP status: Due, Education has been provided regarding the importance of this vaccine. Advised may receive this vaccine at local pharmacy or Health Dept.  Aware to provide a copy of the vaccination record if obtained from local pharmacy or Health Dept. Verbalized acceptance and understanding.   Shingrix Completed?: No.    Education has been provided regarding the importance of this vaccine. Patient has been advised to call insurance company to determine out of pocket expense if they have not yet received this vaccine. Advised may also receive vaccine at local pharmacy or Health Dept. Verbalized acceptance and understanding.  Screening Tests Health Maintenance  Topic Date Due   OPHTHALMOLOGY EXAM  Never done   MAMMOGRAM  Never done   Diabetic kidney evaluation - Urine ACR  08/28/2022   DTaP/Tdap/Td (2 - Td or Tdap) 10/10/2022   HEMOGLOBIN A1C  09/28/2022   INFLUENZA VACCINE  11/04/2022 (Originally 03/06/2022)   Zoster Vaccines- Shingrix (1 of 2) 01/18/2023 (Originally 06/10/1975)   Diabetic kidney evaluation - eGFR measurement  03/29/2023   FOOT EXAM  03/29/2023   Medicare Annual Wellness (AWV)  10/18/2023   COLONOSCOPY (Pts 45-65yr Insurance coverage will need to be confirmed)  05/26/2031   Hepatitis C Screening  Completed   HPV VACCINES  Aged Out   COVID-19 Vaccine  Discontinued   HIV Screening  Discontinued    Health Maintenance Health Maintenance Due  Topic Date Due   OPHTHALMOLOGY EXAM  Never done   MAMMOGRAM  Never done   Diabetic kidney evaluation - Urine ACR  08/28/2022   DTaP/Tdap/Td (2 - Td or Tdap) 10/10/2022   HEMOGLOBIN A1C  09/28/2022   Mammogram- order placed per consent. Number provided for scheduling. [phone].  DG Chest 2 View: ordered by Sindy Guadeloupe, MD with completion 12/05/20.  Hepatitis C Screening: Completed 06/2014.  Vision Screening: Recommended annual ophthalmology exams for early detection of glaucoma and other disorders of the eye. Plans to schedule with [facility], [place].  Dental Screening: Recommended annual dental exams for proper oral hygiene  Community Resource Referral / Chronic Care  Management: CRR required this visit?  No   CCM required this visit?  No      Plan:     I have personally reviewed and noted the following in the patient's chart:   Medical and social history Use of alcohol, tobacco or illicit drugs  Current medications and supplements including opioid prescriptions. Patient is currently taking opioid prescriptions. Information provided to patient regarding non-opioid alternatives. Patient advised to discuss non-opioid treatment plan with their provider. Tramadol. Followed by pcp.  Functional ability and status Nutritional status Physical activity Advanced directives List of other physicians Hospitalizations, surgeries, and ER visits in previous 12 months Vitals Screenings to include cognitive, depression, and falls Referrals and appointments  In addition, I have reviewed and discussed with patient certain preventive protocols, quality metrics, and best practice recommendations. A written personalized care plan for preventive services as well as general preventive health recommendations were provided to patient.     [place], LPN   1/69/6789      I have reviewed the above information and agree with above.   Deborra Medina, MD

## 2022-11-01 ENCOUNTER — Other Ambulatory Visit: Payer: Self-pay | Admitting: Internal Medicine

## 2022-11-01 DIAGNOSIS — F32A Depression, unspecified: Secondary | ICD-10-CM

## 2022-11-09 ENCOUNTER — Other Ambulatory Visit: Payer: Self-pay | Admitting: Internal Medicine

## 2022-11-13 ENCOUNTER — Other Ambulatory Visit: Payer: Self-pay | Admitting: Internal Medicine

## 2022-11-13 NOTE — Telephone Encounter (Signed)
Refilled: 07/27/2022 Last OV: 03/28/2022 Next OV: not scheduled

## 2022-11-15 NOTE — Telephone Encounter (Signed)
Spoke with pt and scheduled her for 11/23/2022

## 2022-11-19 ENCOUNTER — Other Ambulatory Visit: Payer: Self-pay | Admitting: Internal Medicine

## 2022-11-23 ENCOUNTER — Encounter: Payer: Self-pay | Admitting: Internal Medicine

## 2022-11-23 ENCOUNTER — Ambulatory Visit (INDEPENDENT_AMBULATORY_CARE_PROVIDER_SITE_OTHER): Payer: Medicare Other | Admitting: Internal Medicine

## 2022-11-23 VITALS — BP 108/58 | HR 112 | Ht 60.25 in | Wt 131.0 lb

## 2022-11-23 DIAGNOSIS — F321 Major depressive disorder, single episode, moderate: Secondary | ICD-10-CM

## 2022-11-23 DIAGNOSIS — E1140 Type 2 diabetes mellitus with diabetic neuropathy, unspecified: Secondary | ICD-10-CM | POA: Diagnosis not present

## 2022-11-23 DIAGNOSIS — M546 Pain in thoracic spine: Secondary | ICD-10-CM

## 2022-11-23 DIAGNOSIS — K760 Fatty (change of) liver, not elsewhere classified: Secondary | ICD-10-CM | POA: Diagnosis not present

## 2022-11-23 DIAGNOSIS — N3946 Mixed incontinence: Secondary | ICD-10-CM | POA: Diagnosis not present

## 2022-11-23 DIAGNOSIS — R110 Nausea: Secondary | ICD-10-CM | POA: Diagnosis not present

## 2022-11-23 DIAGNOSIS — E785 Hyperlipidemia, unspecified: Secondary | ICD-10-CM | POA: Diagnosis not present

## 2022-11-23 DIAGNOSIS — M79641 Pain in right hand: Secondary | ICD-10-CM

## 2022-11-23 DIAGNOSIS — R300 Dysuria: Secondary | ICD-10-CM | POA: Diagnosis not present

## 2022-11-23 DIAGNOSIS — M47812 Spondylosis without myelopathy or radiculopathy, cervical region: Secondary | ICD-10-CM

## 2022-11-23 DIAGNOSIS — M7918 Myalgia, other site: Secondary | ICD-10-CM | POA: Diagnosis not present

## 2022-11-23 DIAGNOSIS — M79642 Pain in left hand: Secondary | ICD-10-CM

## 2022-11-23 DIAGNOSIS — R200 Anesthesia of skin: Secondary | ICD-10-CM

## 2022-11-23 DIAGNOSIS — E871 Hypo-osmolality and hyponatremia: Secondary | ICD-10-CM

## 2022-11-23 DIAGNOSIS — F411 Generalized anxiety disorder: Secondary | ICD-10-CM | POA: Diagnosis not present

## 2022-11-23 DIAGNOSIS — Z87448 Personal history of other diseases of urinary system: Secondary | ICD-10-CM | POA: Diagnosis not present

## 2022-11-23 DIAGNOSIS — R32 Unspecified urinary incontinence: Secondary | ICD-10-CM | POA: Insufficient documentation

## 2022-11-23 DIAGNOSIS — M545 Low back pain, unspecified: Secondary | ICD-10-CM

## 2022-11-23 LAB — URINALYSIS, ROUTINE W REFLEX MICROSCOPIC
Bilirubin Urine: NEGATIVE
Hgb urine dipstick: NEGATIVE
Ketones, ur: NEGATIVE
Nitrite: NEGATIVE
RBC / HPF: NONE SEEN (ref 0–?)
Specific Gravity, Urine: 1.02 (ref 1.000–1.030)
Total Protein, Urine: NEGATIVE
Urine Glucose: NEGATIVE
Urobilinogen, UA: 0.2 (ref 0.0–1.0)
pH: 6 (ref 5.0–8.0)

## 2022-11-23 LAB — COMPREHENSIVE METABOLIC PANEL
ALT: 12 U/L (ref 0–35)
AST: 15 U/L (ref 0–37)
Albumin: 4 g/dL (ref 3.5–5.2)
Alkaline Phosphatase: 130 U/L — ABNORMAL HIGH (ref 39–117)
BUN: 11 mg/dL (ref 6–23)
CO2: 28 mEq/L (ref 19–32)
Calcium: 9.3 mg/dL (ref 8.4–10.5)
Chloride: 97 mEq/L (ref 96–112)
Creatinine, Ser: 0.77 mg/dL (ref 0.40–1.20)
GFR: 81.5 mL/min (ref >60.00)
Glucose, Bld: 89 mg/dL (ref 70–99)
Potassium: 3.8 mEq/L (ref 3.5–5.1)
Sodium: 137 mEq/L (ref 135–145)
Total Bilirubin: 0.7 mg/dL (ref 0.2–1.2)
Total Protein: 6.4 g/dL (ref 6.0–8.3)

## 2022-11-23 LAB — LIPID PANEL
Cholesterol: 142 mg/dL (ref 0–200)
HDL: 50.3 mg/dL (ref >39.00)
LDL Cholesterol: 72 mg/dL (ref 0–99)
NonHDL: 91.97
Total CHOL/HDL Ratio: 3
Triglycerides: 100 mg/dL (ref 0.0–149.0)
VLDL: 20 mg/dL (ref 0.0–40.0)

## 2022-11-23 LAB — CK: Total CK: 37 U/L (ref 7–177)

## 2022-11-23 LAB — MICROALBUMIN / CREATININE URINE RATIO
Creatinine,U: 61.2 mg/dL
Microalb Creat Ratio: 1.6 mg/g (ref 0.0–30.0)
Microalb, Ur: 1 mg/dL (ref 0.0–1.9)

## 2022-11-23 LAB — LDL CHOLESTEROL, DIRECT: Direct LDL: 80 mg/dL

## 2022-11-23 LAB — HEMOGLOBIN A1C: Hgb A1c MFr Bld: 6 % (ref 4.6–6.5)

## 2022-11-23 MED ORDER — ARIPIPRAZOLE 2 MG PO TABS: 2.0000 mg | ORAL_TABLET | Freq: Every day | ORAL | 1 refills | Status: DC

## 2022-11-23 MED ORDER — SPIRONOLACTONE 50 MG PO TABS: 50.0000 mg | ORAL_TABLET | Freq: Every day | ORAL | 1 refills | Status: DC

## 2022-11-23 MED ORDER — ATORVASTATIN CALCIUM 40 MG PO TABS: 40.0000 mg | ORAL_TABLET | Freq: Every day | ORAL | 1 refills | Status: DC

## 2022-11-23 NOTE — Assessment & Plan Note (Signed)
Improved with cymbalta 60 mg daily and  abilify 2.5 mg daily

## 2022-11-23 NOTE — Assessment & Plan Note (Signed)
Presumed by ultrasound changes and serologies negative for autoimmune causes of hepatitis.  Current liver enzymes are normal except for mildly elevated alk pos ;  all modifiable risk  factors including obesity, diabetes and hyperlipidemia have been addressed   Lab Results  Component Value Date   ALT 12 11/23/2022   AST 15 11/23/2022   ALKPHOS 130 (H) 11/23/2022   BILITOT 0.7 11/23/2022

## 2022-11-23 NOTE — Assessment & Plan Note (Signed)
Ruling out UTI today.  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

## 2022-11-23 NOTE — Assessment & Plan Note (Signed)
Today she reports bilateral hand pain and parasthesias at night but no radicular pain fro neck .  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 Left sided mild foraminal stenosis noted  At multiple levels without nerve root impingement.   No EMG studies done per patient preference,  recommend  getting those dose now

## 2022-11-23 NOTE — Progress Notes (Unsigned)
Subjective:  Patient ID: Kristie Jones, female    DOB: [birth year]  Age: 67 y.o. MRN: [id number]  CC: The primary encounter diagnosis was Type 2 diabetes mellitus with diabetic neuropathy, without long-term current use of insulin. Diagnoses of Hyperlipidemia LDL goal <100, Myofascial muscle pain, Major depressive disorder, single episode, moderate with anxious distress, Dysuria, Cervical spondylosis without myelopathy, Bilateral hand pain, History of urethral stricture, Mixed stress and urge urinary incontinence, Nausea, and NAFLD (nonalcoholic fatty liver disease) were also pertinent to this visit.   HPI Kristie Jones presents for  Chief Complaint  Patient presents with   Medical Management of Chronic Issues   Muscle Pain    Reports feeling pain in most major muscles, not sure if related to arthritis; states she hasn't felt well since having COVID    1) type 2 DM  taking ozempic 1 mg weekly ([facility] ) .  Weight has plateaued and N/VD has resolved.  Wants to continue ozempic.  Post prandial sugar (45 minutes) was 127  2) Depression/chronic pain:  taking cymbalta and abilify.  Using tramadol . Last refill April 8  3) Chronic insomnia/anxiety diazepam 10 mg qhs last refill march 20   4) chronic diffuse muscle pain:  taking atorvastatin . Had COVID illness at Christmas .  Has felt malaised,  lazy,  [place]" since then . Doesn't want to clean the house  5) Bilateral wrist and hand pain . Hands going numb at night .  No prior testing for CTS.  No radicular pain from neck .  Uses a cervical pillow.   6 ) urinary incontinence with end void pain that is fleeting .  Has been incontinent  for 6 months.  Has a history of urethral stricture , has had numerous dilations in the past when stream has been hesitant no hesitation now just pain .    Outpatient Medications Prior to Visit  Medication Sig Dispense Refill   blood glucose meter kit and supplies KIT Dispense based on patient and insurance  preference. Use up to four times daily as directed. (Patient taking differently: Inject 1 each into the skin as directed. Dispense based on patient and insurance preference. Use up to four times daily as directed.) 1 each 11   budesonide (ENTOCORT EC) 3 MG 24 hr capsule Take 3 capsules (9 mg total) by mouth every morning. SCHEDULE VISIT FOR Nov 2023 90 capsule 2   diazepam (VALIUM) 10 MG tablet TAKE 1 TABLET AT BEDTIME AS NEEDED FOR ANXIETY 30 tablet 1   DULoxetine (CYMBALTA) 30 MG capsule TAKE 1 TABLET BY MOUTH ONCE DAILY. (TO BE TAKEN WITH 60MG  TABLET) 90 capsule 2   DULoxetine (CYMBALTA) 60 MG capsule TAKE ONE CAPSULE BY MOUTH EVERY DAY 90 capsule 1   furosemide (LASIX) 20 MG tablet Take 1 tablet (20 mg total) by mouth daily. 90 tablet 3   ondansetron (ZOFRAN-ODT) 4 MG disintegrating tablet Take 1 tablet (4 mg total) by mouth every 8 (eight) hours as needed for nausea or vomiting. 45 tablet 11   potassium chloride SA (KLOR-CON M) 20 MEQ tablet TAKE 1 TABLET BY MOUTH ONCE DAILY. 30 tablet 3   promethazine (PHENERGAN) 12.5 MG tablet Take 1 tablet (12.5 mg total) by mouth at bedtime as needed for nausea or vomiting. 20 tablet 5   Semaglutide, 1 MG/DOSE, 4 MG/3ML SOPN Inject 1 mg into the skin once a week. 3 mL 0   traMADol (ULTRAM) 50 MG tablet TAKE TWO TABLETS BY MOUTH EVERY  6 HOURS AS NEEDED FOR PAIN 240 tablet 2   ARIPiprazole (ABILIFY) 2 MG tablet TAKE ONE TABLET BY MOUTH EVERY DAY 90 tablet 0   atorvastatin (LIPITOR) 40 MG tablet TAKE 1 TABLET BY MOUTH ONCE DAILY. 90 tablet 1   spironolactone (ALDACTONE) 50 MG tablet TAKE ONE TABLET BY MOUTH EVERY DAY 90 tablet 0   No facility-administered medications prior to visit.    Review of Systems;  Patient denies headache, fevers, malaise, unintentional weight loss, skin rash, eye pain, sinus congestion and sinus pain, sore throat, dysphagia,  hemoptysis , cough, dyspnea, wheezing, chest pain, palpitations, orthopnea, edema, abdominal pain, nausea,  melena, diarrhea, constipation, flank pain, dysuria, hematuria, urinary  Frequency, nocturia, numbness, tingling, seizures,  Focal weakness, Loss of consciousness,  Tremor, insomnia, depression, anxiety, and suicidal ideation.      Objective:  BP (!) 108/58   Pulse (!) 112   Ht 5' 0.25" (1.53 m)   Wt 131 lb (59.4 kg)   SpO2 99%   BMI 25.37 kg/m   BP Readings from Last 3 Encounters:  11/23/22 (!) 108/58  05/11/22 122/64  03/28/22 (!) 148/88    Wt Readings from Last 3 Encounters:  11/23/22 131 lb (59.4 kg)  10/18/22 130 lb (59 kg)  05/11/22 134 lb 9.6 oz (61.1 kg)    Physical Exam  Lab Results  Component Value Date   HGBA1C 6.0 03/28/2022   HGBA1C 6.9 (H) 11/28/2021   HGBA1C 6.9 (H) 08/28/2021    Lab Results  Component Value Date   CREATININE 0.89 03/28/2022   CREATININE 1.24 (H) 01/03/2022   CREATININE 0.87 11/28/2021    Lab Results  Component Value Date   WBC 8.8 03/28/2022   HGB 15.9 (H) 03/28/2022   HCT 46.7 (H) 03/28/2022   PLT 208.0 03/28/2022   GLUCOSE 108 (H) 03/28/2022   CHOL 169 03/28/2022   TRIG 80.0 03/28/2022   HDL 101.50 03/28/2022   LDLDIRECT 79.0 11/19/2016   LDLCALC 52 03/28/2022   ALT 23 03/28/2022   AST 19 03/28/2022   NA 134 (L) 03/28/2022   K 3.9 03/28/2022   CL 94 (L) 03/28/2022   CREATININE 0.89 03/28/2022   BUN 11 03/28/2022   CO2 28 03/28/2022   TSH 2.09 07/03/2016   INR 0.9 08/15/2020   HGBA1C 6.0 03/28/2022   MICROALBUR <0.7 08/28/2021    US THYROID  Result Date: 09/11/2021 CLINICAL DATA:  Thyroid nodule follow-up EXAM: THYROID ULTRASOUND TECHNIQUE: Ultrasound examination of the thyroid gland and adjacent soft tissues was performed. COMPARISON:  None. FINDINGS: Parenchymal Echotexture: Markedly heterogenous Isthmus: 0.3 cm Right lobe: 4.3 x 1.9 x 1.4 cm Left lobe: 3.7 x 1.1 x 1.8 cm _________________________________________________________ Estimated total number of nodules >/= 1 cm: 0 Number of spongiform nodules >/=  2 cm  not described below (TR1): 0 Number of mixed cystic and solid nodules >/= 1.5 cm not described below (TR2): 0 _________________________________________________________ 0.7 cm cystic nodule, containing colloid, in the inferior right thyroid lobe does not meet criteria for FNA or imaging surveillance. IMPRESSION: Marked diffuse heterogeneity of the thyroid parenchyma without discrete suspicious nodule. The above is in keeping with the ACR TI-RADS recommendations - J Am Coll Radiol 2017;14:587-595. Electronically Signed   By: Acquanetta Belling M.D.   On: 09/11/2021 15:47    Assessment & Plan:  .Type 2 diabetes mellitus with diabetic neuropathy, without long-term current use of insulin Assessment & Plan: Her neuropathy is managed with gabapentin.  Her diabetes is Currently well managed with  Ozempic only.   She has a metformin intolerance.  Advised to stay at 1.0 mg dose .  Continue statin unless CK is elevated today  Lab Results  Component Value Date   HGBA1C 6.0 03/28/2022   Lab Results  Component Value Date   MICROALBUR <0.7 08/28/2021   MICROALBUR <0.7 08/15/2020    Lab Results  Component Value Date   CREATININE 0.89 03/28/2022      Orders: -     Microalbumin / creatinine urine ratio -     Hemoglobin A1c -     Comprehensive metabolic panel  Hyperlipidemia LDL goal <100 -     Lipid panel -     LDL cholesterol, direct  Myofascial muscle pain Assessment & Plan: Ddx includes statin myopathy, (has been taking statins since < 2018),  COVID syndrome (patient's self diagnosis based on COVID symptoms and COVID exposure in December) or FM/OA.  Discussed role of exercise in all diagnoses except statin myopathy  Orders: -     CK  Major depressive disorder, single episode, moderate with anxious distress Assessment & Plan: Currently taking cymbalta 60 mg daily and  abilify 2.5 mg daily for persistent symptoms and fatigue    Dysuria -     Urinalysis, Routine w reflex microscopic -      Urine Culture  Cervical spondylosis without myelopathy Assessment & Plan: Today she reports bilateral hand pain and parasthesias at night but no radicular pain fro neck .  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 Left sided mild foraminal stenosis noted  At multiple levels without nerve root impingement.   No EMG studies done per patient preference,  recommend  getting those dose now  Orders: -     Ambulatory referral to Neurology  Bilateral hand pain -     Ambulatory referral to Neurology  History of urethral stricture Assessment & Plan: Requiring dilations (3 in the past) for obstructive symptoms.  Currently voiding well    Mixed stress and urge urinary incontinence Assessment & Plan: Ruling out UTI today.  Avoid anticholinergics  given history of urinary retention from urethral stricture   Nausea Assessment & Plan: Previously attributed to OZempic.  Now resolved. Tolerating ozempic and weight is stable    NAFLD (nonalcoholic fatty liver disease) Assessment & Plan: Presumed by ultrasound changes and serologies negative for autoimmune causes of hepatitis.  Current liver enzymes are normal and all modifiable risk factors including obesity, diabetes and hyperlipidemia have been addressed    Other orders -     ARIPiprazole; Take 1 tablet (2 mg total) by mouth daily.  Dispense: 90 tablet; Refill: 1 -     Atorvastatin Calcium; Take 1 tablet (40 mg total) by mouth daily.  Dispense: 90 tablet; Refill: 1 -     Spironolactone; Take 1 tablet (50 mg total) by mouth daily.  Dispense: 90 tablet; Refill: 1     I provided 30 minutes of face-to-face time during this encounter reviewing patient's last visit with me, patient's  most recent visit with cardiology,  nephrology,  and neurology,  recent surgical and non surgical procedures, previous  labs and imaging studies, counseling on currently addressed issues,   and post visit ordering to diagnostics and therapeutics .   Follow-up: No follow-ups on file.   Sherlene Shams, MD

## 2022-11-23 NOTE — Assessment & Plan Note (Signed)
Ddx includes statin myopathy, (has been taking statins since < 2018),  COVID syndrome (patient's self diagnosis based on COVID symptoms and COVID exposure in December) or FM/OA.  Discussed role of exercise in all diagnoses except statin myopathy

## 2022-11-23 NOTE — Patient Instructions (Addendum)
Your urethral pain may be from a UTI  UA and culture will be done today.  If your muscle enzyme (CK) is normal,  then you have the GREEN LIGHT to start walking and exercising again   Your annual mammogram AND  DEXA  SCAN have been ordered.  Please call [facility] to call to make your appointments  .  The phone number for [facility] is  [phone]

## 2022-11-23 NOTE — Assessment & Plan Note (Signed)
Requiring dilations (3 in the past) for obstructive symptoms.  Currently voiding well

## 2022-11-23 NOTE — Assessment & Plan Note (Signed)
Her neuropathy is managed with gabapentin.  Her diabetes is Currently well managed with  Ozempic only.   She has a metformin intolerance.  Advised to stay at 1.0 mg dose .  Continue statin unless CK is elevated today  Lab Results  Component Value Date   HGBA1C 6.0 03/28/2022   Lab Results  Component Value Date   MICROALBUR <0.7 08/28/2021   MICROALBUR <0.7 08/15/2020    Lab Results  Component Value Date   CREATININE 0.89 03/28/2022

## 2022-11-23 NOTE — Assessment & Plan Note (Signed)
Previously attributed to OZempic.  Now resolved. Tolerating ozempic and weight is stable

## 2022-11-25 DIAGNOSIS — R200 Anesthesia of skin: Secondary | ICD-10-CM | POA: Insufficient documentation

## 2022-11-25 NOTE — Assessment & Plan Note (Signed)
chronic.    continue use of tramadol for pain management .  Refill history confirmed via [place] Controlled Substance databas, accessed by me today.Marland Kitchen

## 2022-11-25 NOTE — Assessment & Plan Note (Addendum)
Secondary to overdiuresis,  Currently resolved.

## 2022-11-25 NOTE — Assessment & Plan Note (Signed)
Aggravated by life stressors  of financial instability  Managed with cymbalta and prn valium

## 2022-11-25 NOTE — Assessment & Plan Note (Signed)
Symptoms are subjective.  Unclear if symptoms are due to CTS or cervical spondylosis given the absence of radiculitis .  Referring to neurology for EMG/[place] studies.

## 2022-12-02 ENCOUNTER — Other Ambulatory Visit: Payer: Self-pay | Admitting: Internal Medicine

## 2022-12-02 DIAGNOSIS — Z760 Encounter for issue of repeat prescription: Secondary | ICD-10-CM

## 2022-12-02 NOTE — Telephone Encounter (Signed)
LOV: 4.19.24  NOV: N/A

## 2022-12-27 ENCOUNTER — Telehealth: Payer: Self-pay | Admitting: Oncology

## 2022-12-27 NOTE — Telephone Encounter (Signed)
Pt called to cancel appts for lab/MD Smith Robert) appt. She stated she does not think she needs the appts anymore and will call us back if she needs to see [place].

## 2023-01-01 ENCOUNTER — Inpatient Hospital Stay: Payer: Medicare Other

## 2023-01-01 ENCOUNTER — Inpatient Hospital Stay: Payer: Medicare Other | Admitting: Oncology

## 2023-01-11 ENCOUNTER — Telehealth: Payer: Self-pay | Admitting: *Deleted

## 2023-01-11 NOTE — Telephone Encounter (Signed)
Patient notified that Patient Assistance Medication are in the office & are ready for pick up.   Medication: Ozempic 1mg   Quantity: 4  Lot# [id number]  Exp: 05/05/2025

## 2023-01-15 DIAGNOSIS — Z86006 Personal history of melanoma in-situ: Secondary | ICD-10-CM | POA: Diagnosis not present

## 2023-01-15 DIAGNOSIS — L570 Actinic keratosis: Secondary | ICD-10-CM | POA: Diagnosis not present

## 2023-01-15 DIAGNOSIS — L820 Inflamed seborrheic keratosis: Secondary | ICD-10-CM | POA: Diagnosis not present

## 2023-01-15 DIAGNOSIS — L814 Other melanin hyperpigmentation: Secondary | ICD-10-CM | POA: Diagnosis not present

## 2023-01-15 DIAGNOSIS — D229 Melanocytic nevi, unspecified: Secondary | ICD-10-CM | POA: Diagnosis not present

## 2023-01-15 DIAGNOSIS — Z86007 Personal history of in-situ neoplasm of skin: Secondary | ICD-10-CM | POA: Diagnosis not present

## 2023-02-05 ENCOUNTER — Telehealth: Payer: Self-pay

## 2023-02-05 NOTE — Telephone Encounter (Signed)
Pt came in to office to pick up patient assistance medication 4 boxes of Ozempic @2 :30 pm

## 2023-02-23 IMAGING — US US THYROID
1 series · 14 of 25 positions shown · non-contrast
Comparison: None.

CLINICAL DATA: Thyroid nodule follow-up

EXAM:
THYROID ULTRASOUND
TECHNIQUE: Ultrasound examination of the thyroid gland and adjacent soft
tissues was performed.

[Series 1: us thyroid · 0.06mm/px · 61 acquisitions, 14 frames shown]
[im 1/61]
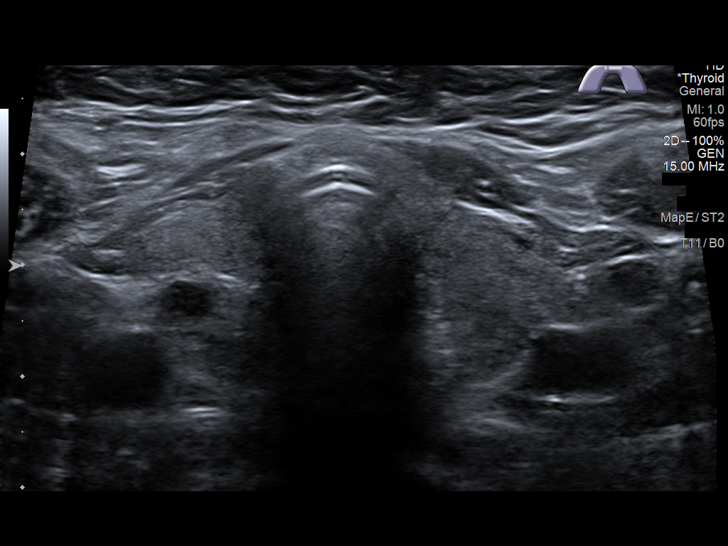
[im 6/61]
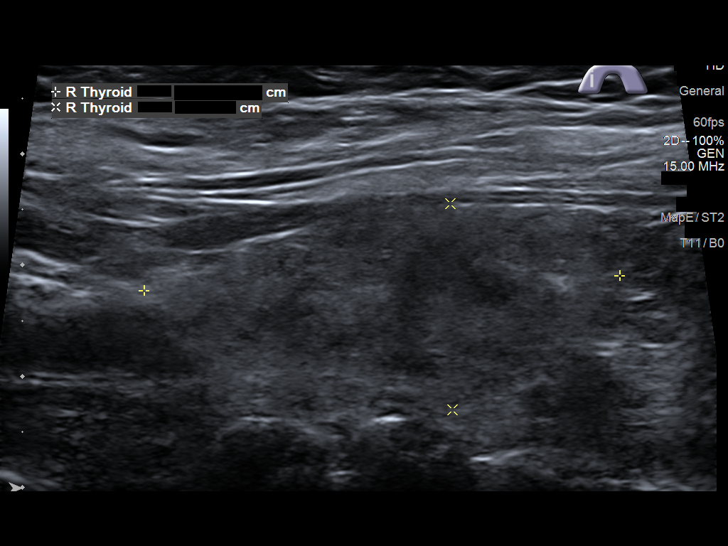
[im 11/61]
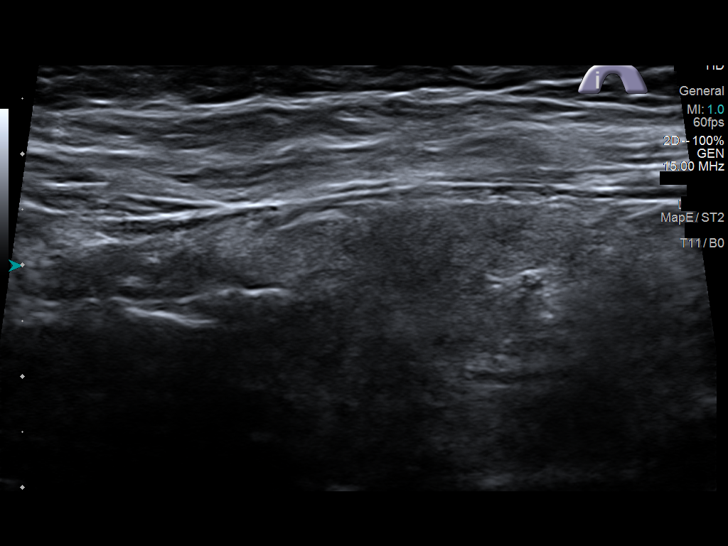
[im 16/61]
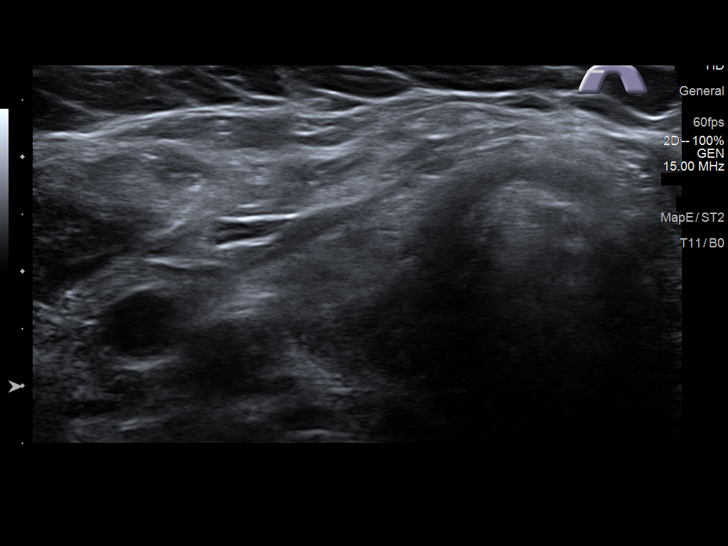
[im 21/61]
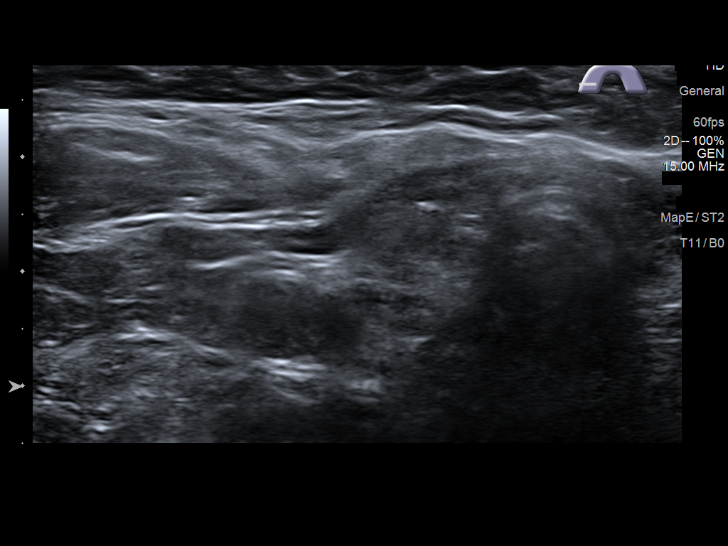
[im 23/61]
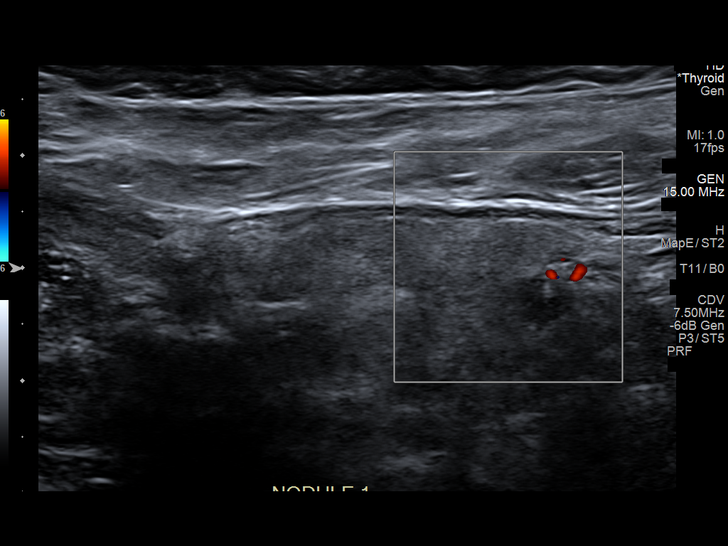
[im 28/61]
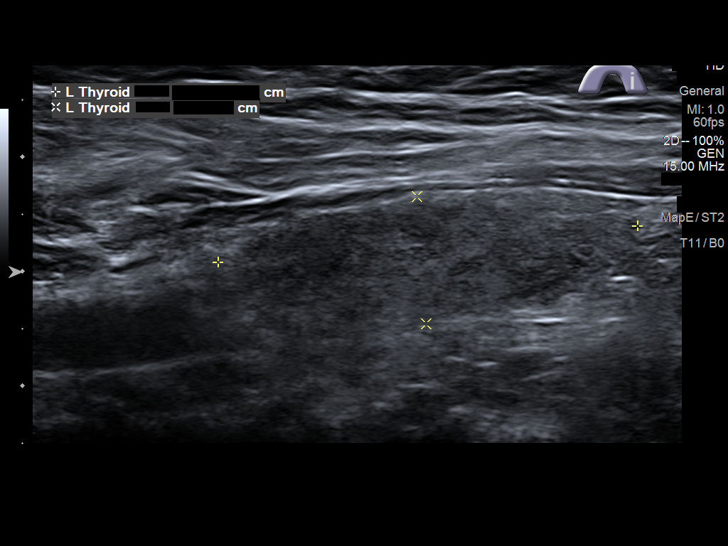
[im 33/61]
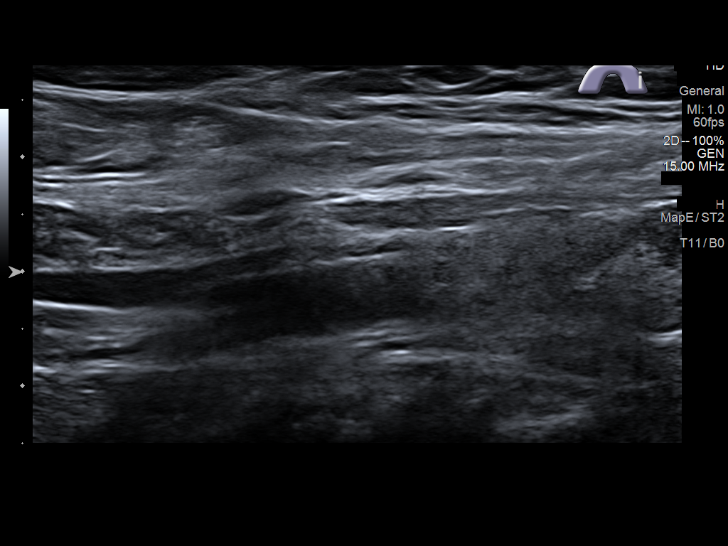
[im 38/61]
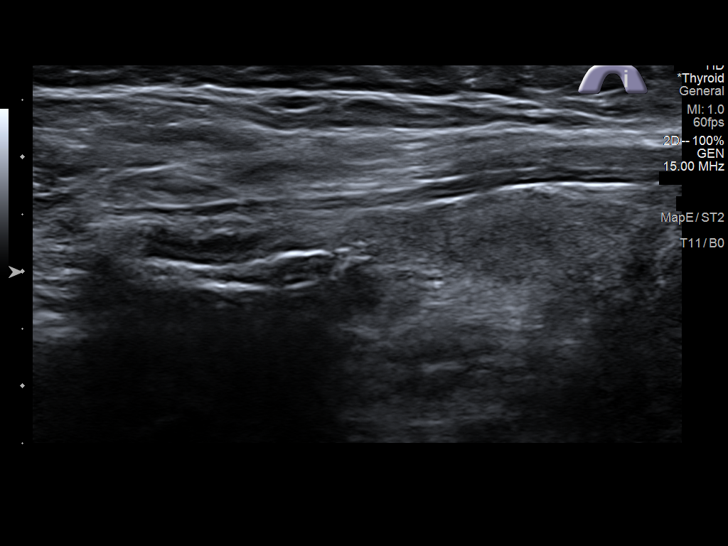
[im 41/61]
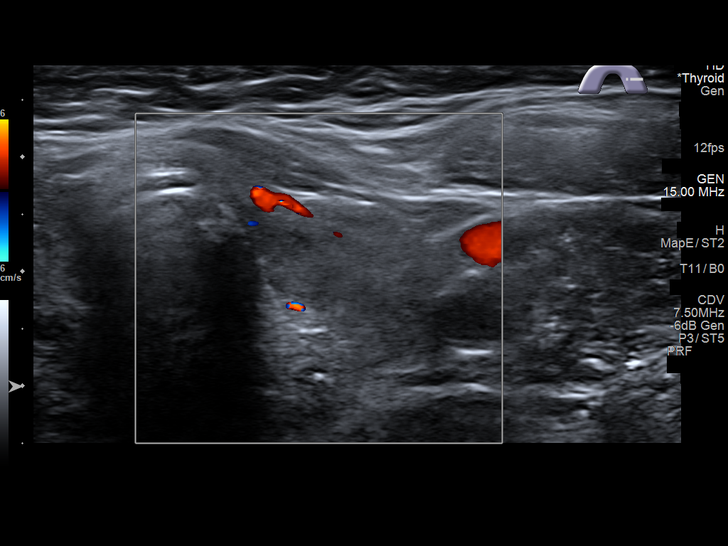
[im 46/61]
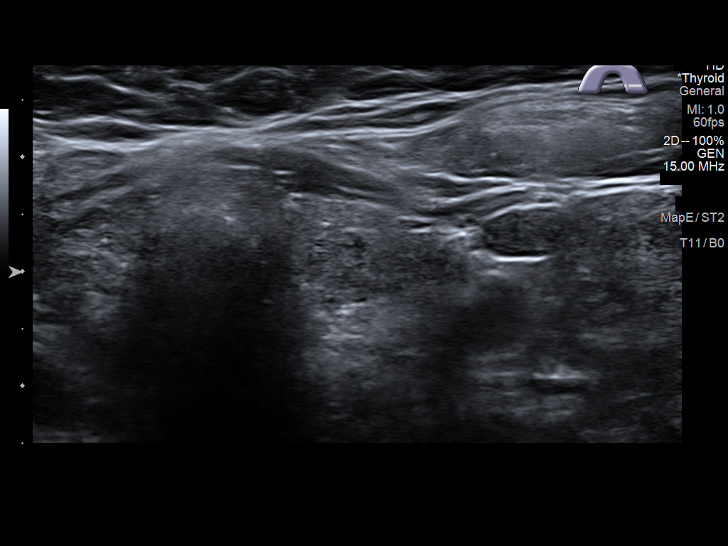
[im 51/61]
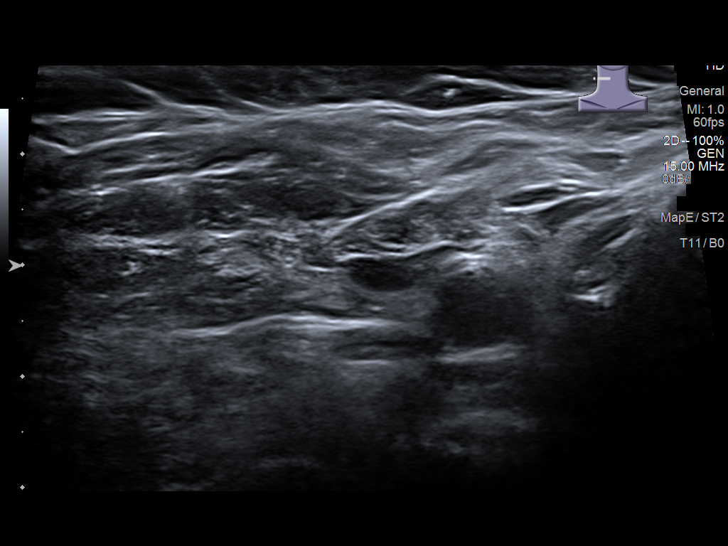
[im 56/61]
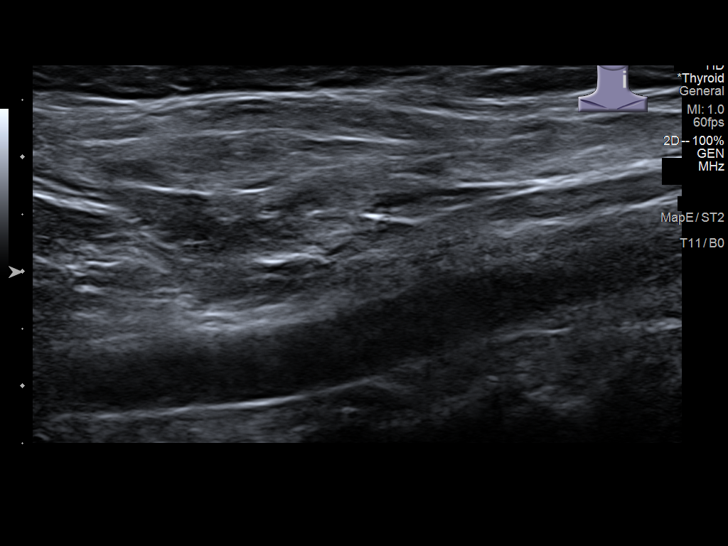
[im 61/61]
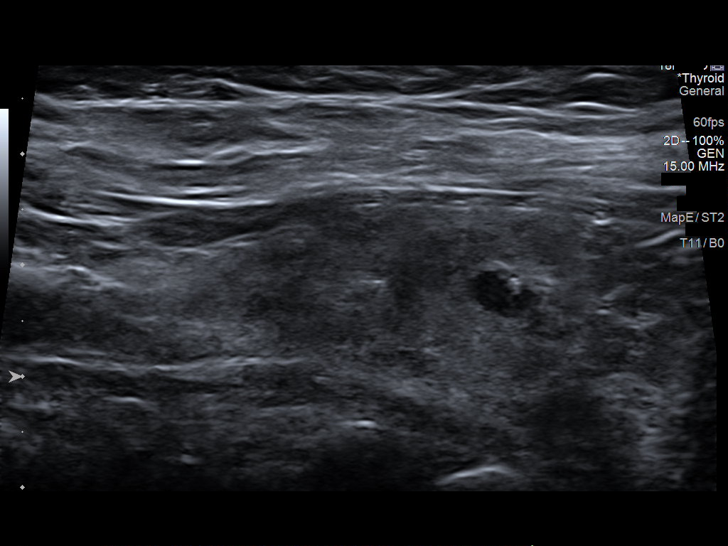

[14 of 25 positions shown; findings below may reference images not displayed]

FINDINGS: Parenchymal Echotexture: Markedly heterogenous

Isthmus: 0.3 cm

Right lobe: 4.3 x 1.9 x 1.4 cm

Left lobe: 3.7 x 1.1 x 1.8 cm

_________________________________________________________

Estimated total number of nodules >/= 1 cm: 0

Number of spongiform nodules >/=  2 cm not described below (TR1): 0

Number of mixed cystic and solid nodules >/= 1.5 cm not described
below (TR2): 0

_________________________________________________________

0.7 cm cystic nodule, containing colloid, in the inferior right
thyroid lobe does not meet criteria for FNA or imaging surveillance.
IMPRESSION: Marked diffuse heterogeneity of the thyroid parenchyma without
discrete suspicious nodule.

The above is in keeping with the ACR TI-RADS recommendations - [HOSPITAL] 8153;[DATE].

## 2023-03-01 ENCOUNTER — Other Ambulatory Visit: Payer: Self-pay | Admitting: Internal Medicine

## 2023-03-01 NOTE — Telephone Encounter (Signed)
Refilled: 07/27/2022 Last OV: 11/23/2022 Next OV: not scheduled

## 2023-03-03 ENCOUNTER — Other Ambulatory Visit: Payer: Self-pay | Admitting: Internal Medicine

## 2023-03-03 DIAGNOSIS — Z760 Encounter for issue of repeat prescription: Secondary | ICD-10-CM

## 2023-03-04 NOTE — Telephone Encounter (Signed)
Requesting: Tramadol Contract: No UDS: no Last Visit: 11/23/2022 Next Visit: not scheduled  Last Refill: 12/02/2022  Please Advise

## 2023-04-10 ENCOUNTER — Other Ambulatory Visit: Payer: Self-pay | Admitting: Internal Medicine

## 2023-04-20 ENCOUNTER — Other Ambulatory Visit: Payer: Self-pay | Admitting: Internal Medicine

## 2023-04-20 DIAGNOSIS — F32A Depression, unspecified: Secondary | ICD-10-CM

## 2023-05-14 DIAGNOSIS — L814 Other melanin hyperpigmentation: Secondary | ICD-10-CM | POA: Diagnosis not present

## 2023-05-14 DIAGNOSIS — D229 Melanocytic nevi, unspecified: Secondary | ICD-10-CM | POA: Diagnosis not present

## 2023-05-14 DIAGNOSIS — Z85828 Personal history of other malignant neoplasm of skin: Secondary | ICD-10-CM | POA: Diagnosis not present

## 2023-05-14 DIAGNOSIS — Z85820 Personal history of malignant melanoma of skin: Secondary | ICD-10-CM | POA: Diagnosis not present

## 2023-05-14 DIAGNOSIS — L570 Actinic keratosis: Secondary | ICD-10-CM | POA: Diagnosis not present

## 2023-05-14 DIAGNOSIS — L821 Other seborrheic keratosis: Secondary | ICD-10-CM | POA: Diagnosis not present

## 2023-05-16 ENCOUNTER — Telehealth: Payer: Self-pay

## 2023-05-16 NOTE — Telephone Encounter (Signed)
LMTCB.   Need to let pt know that we have received her patient assistance medication and it is ready for pick up.    Ozempic: 4 boxes

## 2023-05-30 NOTE — Telephone Encounter (Signed)
Patient states she is returning our call.  I read Adair Laundry, CMA's message to patient.

## 2023-05-30 NOTE — Telephone Encounter (Signed)
Noted  

## 2023-06-04 ENCOUNTER — Encounter: Payer: Self-pay | Admitting: Internal Medicine

## 2023-06-04 ENCOUNTER — Other Ambulatory Visit: Payer: Self-pay | Admitting: Internal Medicine

## 2023-06-04 DIAGNOSIS — Z760 Encounter for issue of repeat prescription: Secondary | ICD-10-CM

## 2023-06-04 NOTE — Telephone Encounter (Signed)
Can you refuse the request because it is controlled I can not refuse it

## 2023-06-04 NOTE — Telephone Encounter (Signed)
Refilled: 03/05/2023 Last OV: 11/23/2022 Next OV: not scheduled

## 2023-06-05 NOTE — Telephone Encounter (Signed)
Patient has been scheduled for an office visit with Dr. Duncan Dull on 06/21/2023.  I offered visit for tomorrow, but she does not have transportation.

## 2023-06-06 ENCOUNTER — Ambulatory Visit: Payer: Medicare Other | Admitting: Internal Medicine

## 2023-06-06 MED ORDER — TRAMADOL HCL 50 MG PO TABS
ORAL_TABLET | ORAL | 0 refills | Status: DC
Start: 2023-06-06 — End: 2023-06-21

## 2023-06-21 ENCOUNTER — Telehealth: Payer: Self-pay

## 2023-06-21 ENCOUNTER — Encounter: Payer: Self-pay | Admitting: Internal Medicine

## 2023-06-21 ENCOUNTER — Ambulatory Visit: Payer: Medicare Other | Admitting: Internal Medicine

## 2023-06-21 VITALS — BP 108/60 | HR 95 | Temp 98.4°F | Ht 60.0 in | Wt 100.4 lb

## 2023-06-21 DIAGNOSIS — R110 Nausea: Secondary | ICD-10-CM

## 2023-06-21 DIAGNOSIS — F329 Major depressive disorder, single episode, unspecified: Secondary | ICD-10-CM

## 2023-06-21 DIAGNOSIS — R634 Abnormal weight loss: Secondary | ICD-10-CM | POA: Diagnosis not present

## 2023-06-21 DIAGNOSIS — Z1231 Encounter for screening mammogram for malignant neoplasm of breast: Secondary | ICD-10-CM

## 2023-06-21 DIAGNOSIS — Z760 Encounter for issue of repeat prescription: Secondary | ICD-10-CM | POA: Diagnosis not present

## 2023-06-21 DIAGNOSIS — E785 Hyperlipidemia, unspecified: Secondary | ICD-10-CM

## 2023-06-21 DIAGNOSIS — F321 Major depressive disorder, single episode, moderate: Secondary | ICD-10-CM | POA: Diagnosis not present

## 2023-06-21 DIAGNOSIS — R1013 Epigastric pain: Secondary | ICD-10-CM | POA: Diagnosis not present

## 2023-06-21 DIAGNOSIS — Z780 Asymptomatic menopausal state: Secondary | ICD-10-CM

## 2023-06-21 DIAGNOSIS — E0590 Thyrotoxicosis, unspecified without thyrotoxic crisis or storm: Secondary | ICD-10-CM

## 2023-06-21 DIAGNOSIS — F411 Generalized anxiety disorder: Secondary | ICD-10-CM

## 2023-06-21 DIAGNOSIS — E041 Nontoxic single thyroid nodule: Secondary | ICD-10-CM | POA: Diagnosis not present

## 2023-06-21 DIAGNOSIS — E1140 Type 2 diabetes mellitus with diabetic neuropathy, unspecified: Secondary | ICD-10-CM

## 2023-06-21 DIAGNOSIS — R748 Abnormal levels of other serum enzymes: Secondary | ICD-10-CM

## 2023-06-21 LAB — CBC WITH DIFFERENTIAL/PLATELET
Basophils Absolute: 0 10*3/uL (ref 0.0–0.1)
Basophils Relative: 0.6 % (ref 0.0–3.0)
Eosinophils Absolute: 0.1 10*3/uL (ref 0.0–0.7)
Eosinophils Relative: 1.1 % (ref 0.0–5.0)
HCT: 44.6 % (ref 36.0–46.0)
Hemoglobin: 14.9 g/dL (ref 12.0–15.0)
Lymphocytes Relative: 31.2 % (ref 12.0–46.0)
Lymphs Abs: 2.3 10*3/uL (ref 0.7–4.0)
MCHC: 33.3 g/dL (ref 30.0–36.0)
MCV: 89.5 fL (ref 78.0–100.0)
Monocytes Absolute: 0.5 10*3/uL (ref 0.1–1.0)
Monocytes Relative: 6.8 % (ref 3.0–12.0)
Neutro Abs: 4.3 10*3/uL (ref 1.4–7.7)
Neutrophils Relative %: 60.3 % (ref 43.0–77.0)
Platelets: 240 10*3/uL (ref 150.0–400.0)
RBC: 4.99 Mil/uL (ref 3.87–5.11)
RDW: 14 % (ref 11.5–15.5)
WBC: 7.2 10*3/uL (ref 4.0–10.5)

## 2023-06-21 LAB — LIPID PANEL
Cholesterol: 146 mg/dL (ref 0–200)
HDL: 50.3 mg/dL (ref >39.00)
LDL Cholesterol: 81 mg/dL (ref 0–99)
NonHDL: 95.89
Total CHOL/HDL Ratio: 3
Triglycerides: 75 mg/dL (ref 0.0–149.0)
VLDL: 15 mg/dL (ref 0.0–40.0)

## 2023-06-21 LAB — COMPREHENSIVE METABOLIC PANEL
ALT: 13 U/L (ref 0–35)
AST: 17 U/L (ref 0–37)
Albumin: 4 g/dL (ref 3.5–5.2)
Alkaline Phosphatase: 119 U/L — ABNORMAL HIGH (ref 39–117)
BUN: 12 mg/dL (ref 6–23)
CO2: 33 meq/L — ABNORMAL HIGH (ref 19–32)
Calcium: 9.2 mg/dL (ref 8.4–10.5)
Chloride: 98 meq/L (ref 96–112)
Creatinine, Ser: 0.81 mg/dL (ref 0.40–1.20)
GFR: 76.39 mL/min (ref >60.00)
Glucose, Bld: 88 mg/dL (ref 70–99)
Potassium: 3.8 meq/L (ref 3.5–5.1)
Sodium: 137 meq/L (ref 135–145)
Total Bilirubin: 0.7 mg/dL (ref 0.2–1.2)
Total Protein: 6.6 g/dL (ref 6.0–8.3)

## 2023-06-21 LAB — HEMOGLOBIN A1C: Hgb A1c MFr Bld: 5.6 % (ref 4.6–6.5)

## 2023-06-21 LAB — LIPASE: Lipase: 117 U/L — ABNORMAL HIGH (ref 11.0–59.0)

## 2023-06-21 LAB — TSH: TSH: <0.01 u[IU]/mL — ABNORMAL LOW (ref 0.35–5.50)

## 2023-06-21 LAB — LDL CHOLESTEROL, DIRECT: Direct LDL: 78 mg/dL

## 2023-06-21 MED ORDER — BUPROPION HCL ER (XL) 150 MG PO TB24: 150.0000 mg | ORAL_TABLET | Freq: Every day | ORAL | 1 refills | Status: DC

## 2023-06-21 MED ORDER — DIAZEPAM 10 MG PO TABS: ORAL_TABLET | ORAL | 1 refills | Status: DC

## 2023-06-21 MED ORDER — ATORVASTATIN CALCIUM 40 MG PO TABS: 40.0000 mg | ORAL_TABLET | Freq: Every day | ORAL | 1 refills | Status: DC

## 2023-06-21 MED ORDER — ONDANSETRON 4 MG PO TBDP: 4.0000 mg | ORAL_TABLET | Freq: Three times a day (TID) | ORAL | 11 refills | Status: AC | PRN

## 2023-06-21 MED ORDER — SPIRONOLACTONE 50 MG PO TABS: 50.0000 mg | ORAL_TABLET | Freq: Every day | ORAL | 1 refills | Status: DC

## 2023-06-21 MED ORDER — FUROSEMIDE 20 MG PO TABS: 20.0000 mg | ORAL_TABLET | Freq: Every day | ORAL | 3 refills | Status: DC

## 2023-06-21 MED ORDER — TRAMADOL HCL 50 MG PO TABS: ORAL_TABLET | ORAL | 2 refills | Status: DC

## 2023-06-21 NOTE — Telephone Encounter (Signed)
Medication Samples have been provided to the patient.  Drug name: Ozempic      Strength: 0.5        Qty: 1 LOT: [id number]  Exp.Date: 02-03-24  Dosing instructions: inject  0.5 mg weekly   The patient has been instructed regarding the correct time, dose, and frequency of taking this medication, including desired effects and most common side effects.   Kristie Jones 11:24 AM 06/21/2023   Medication Samples have been provided to the patient.  Drug name: Ozempic       Strength: 0.5        Qty: 1 LOT: [id number]  Exp.Date: 09-05-24  Dosing instructions: T-INJECT 0.5 MG WEEKLY  The patient has been instructed regarding the correct time, dose, and frequency of taking this medication, including desired effects and most common side effects.   Kristie Jones 11:24 AM 06/21/2023

## 2023-06-21 NOTE — Telephone Encounter (Signed)
Pt is currently on Patient assistance for Ozempic 1 mg however, PCP Dr. Darrick Huntsman would like to reduce pts dosage down to 0.5 mg. Is there away to get a new form faxed attn to Dr. Darrick Huntsman to request a doose change on her Ozempic ?

## 2023-06-21 NOTE — Assessment & Plan Note (Signed)
Now with loss of libipid and motivation.  Reducing cymbalta to 30 mg and adding wellbutrin.  She stopped taking abilify 8 months ago

## 2023-06-21 NOTE — Patient Instructions (Addendum)
1) for your depression,  I recommend reducing   cymbalta and adding  wellbutrin :  Start wellbutrin one tablet daily  in the morning   Reduce cymbalta to 60 mg daily for week 1  At the beginning of Week 2,   reduce cymbalta to 30 mg daily    and continue 150 mg wellbutrin   After 2 full weeks of wellbutrin 150 mg daily you may increase dose to 300 mg if needed     I am reducing your ozempic dose to 0.5 mg weekly because you have lost too much weight   OZEMPIC PENS CAN BE MANIPULATED INTO DELIVERING LOWER DOSES BY "COUNTING CLICKS"    4 MG PEN  (TEAL( THAT DELIVERS THE 1 MG WEEKLY DOSE) can deliver these lower doses :  19 CLICKS    0.25 MG DOSE 37 CLICKS    0.50 MG DOSE

## 2023-06-21 NOTE — Progress Notes (Unsigned)
Subjective:  Patient ID: Kristie Jones, female    DOB: [birth year]  Age: 67 y.o. MRN: [id number]  CC: The primary encounter diagnosis was Encounter for screening mammogram for malignant neoplasm of breast. Diagnoses of Postmenopausal estrogen deficiency, Type 2 diabetes mellitus with diabetic neuropathy, without long-term current use of insulin (HCC), Hyperlipidemia LDL goal <100, Left thyroid nodule, and Nausea were also pertinent to this visit.   HPI Kristie Jones presents for  Chief Complaint  Patient presents with   Medication Refill    And discuss diabetic medication    1) Chronic pain   2) type 2 DM:   last seen in April.    she has been using Ozempic 1 mg weekly for  the past year  for diabetes management and has lost a significant amount  of weight since her last visit.  She has lost 30 lbs since April and is  now UNDERWEIGHT.   Has had occasional vomiting when she gets overheated.    "I'm at the weight I was when I got married 42  years ago>'  she agrees that she is underweight currently taking 1 mg ozempic .  65 lb weight loss.  Diet reviewed:  breakfast sandwhich,  lunch not daily,    snacks of skinny girl popcorn,  has not been cooking dinner for months,  orders takeout . Gets nauseated on average 3 nights per week at bedtime.    3) Depression:  stopped abilify on her own 8 months ago. Feeling like she is coming out of  rut.  Has not been motivated to take care of home,  have intercourse .  Turned 67 Nov 5,  big deal for her.    Outpatient Medications Prior to Visit  Medication Sig Dispense Refill   ARIPiprazole (ABILIFY) 2 MG tablet Take 1 tablet (2 mg total) by mouth daily. 90 tablet 1   atorvastatin (LIPITOR) 40 MG tablet Take 1 tablet (40 mg total) by mouth daily. 90 tablet 1   blood glucose meter kit and supplies KIT Dispense based on patient and insurance preference. Use up to four times daily as directed. (Patient taking differently: Inject 1 each into the skin as  directed. Dispense based on patient and insurance preference. Use up to four times daily as directed.) 1 each 11   budesonide (ENTOCORT EC) 3 MG 24 hr capsule Take 3 capsules (9 mg total) by mouth every morning. SCHEDULE VISIT FOR Nov 2023 90 capsule 2   diazepam (VALIUM) 10 MG tablet TAKE 1 TABLET AT BEDTIME AS NEEDED FOR ANXIETY 30 tablet 1   DULoxetine (CYMBALTA) 30 MG capsule TAKE 1 TABLET BY MOUTH ONCE DAILY. (TO BE TAKEN WITH 60MG  TABLET) ` 90 capsule 2   DULoxetine (CYMBALTA) 60 MG capsule TAKE ONE CAPSULE BY MOUTH EVERY DAY 90 capsule 2   furosemide (LASIX) 20 MG tablet Take 1 tablet (20 mg total) by mouth daily. 90 tablet 3   ondansetron (ZOFRAN-ODT) 4 MG disintegrating tablet Take 1 tablet (4 mg total) by mouth every 8 (eight) hours as needed for nausea or vomiting. 45 tablet 11   potassium chloride SA (KLOR-CON M) 20 MEQ tablet TAKE 1 TABLET BY MOUTH ONCE DAILY. 30 tablet 3   promethazine (PHENERGAN) 12.5 MG tablet TAKE 1 TABLET AT BEDTIME AS NEEDED FOR NAUSEA & VOMITING 20 tablet 5   Semaglutide, 1 MG/DOSE, 4 MG/3ML SOPN Inject 1 mg into the skin once a week. 3 mL 0   spironolactone (ALDACTONE) 50 MG  tablet Take 1 tablet (50 mg total) by mouth daily. 90 tablet 1   traMADol (ULTRAM) 50 MG tablet TAKE TWO TABLETS BY MOUTH EVERY 6 HOURS AS NEEDED FOR PAIN 240 tablet 0   No facility-administered medications prior to visit.    Review of Systems;  Patient denies headache, fevers, malaise, unintentional weight loss, skin rash, eye pain, sinus congestion and sinus pain, sore throat, dysphagia,  hemoptysis , cough, dyspnea, wheezing, chest pain, palpitations, orthopnea, edema, abdominal pain, nausea, melena, diarrhea, constipation, flank pain, dysuria, hematuria, urinary  Frequency, nocturia, numbness, tingling, seizures,  Focal weakness, Loss of consciousness,  Tremor, insomnia, depression, anxiety, and suicidal ideation.      Objective:  BP 108/60   Pulse 95   Temp 98.4 F (36.9 C)    Ht 5' (1.524 m)   Wt 100 lb 6.4 oz (45.5 kg)   SpO2 96%   BMI 19.61 kg/m   BP Readings from Last 3 Encounters:  06/21/23 108/60  11/23/22 (!) 108/58  05/11/22 122/64    Wt Readings from Last 3 Encounters:  06/21/23 100 lb 6.4 oz (45.5 kg)  11/23/22 131 lb (59.4 kg)  10/18/22 130 lb (59 kg)    Physical Exam  Lab Results  Component Value Date   HGBA1C 6.0 11/23/2022   HGBA1C 6.0 03/28/2022   HGBA1C 6.9 (H) 11/28/2021    Lab Results  Component Value Date   CREATININE 0.77 11/23/2022   CREATININE 0.89 03/28/2022   CREATININE 1.24 (H) 01/03/2022    Lab Results  Component Value Date   WBC 8.8 03/28/2022   HGB 15.9 (H) 03/28/2022   HCT 46.7 (H) 03/28/2022   PLT 208.0 03/28/2022   GLUCOSE 89 11/23/2022   CHOL 142 11/23/2022   TRIG 100.0 11/23/2022   HDL 50.30 11/23/2022   LDLDIRECT 80.0 11/23/2022   LDLCALC 72 11/23/2022   ALT 12 11/23/2022   AST 15 11/23/2022   NA 137 11/23/2022   K 3.8 11/23/2022   CL 97 11/23/2022   CREATININE 0.77 11/23/2022   BUN 11 11/23/2022   CO2 28 11/23/2022   TSH 2.09 07/03/2016   INR 0.9 08/15/2020   HGBA1C 6.0 11/23/2022   MICROALBUR 1.0 11/23/2022    US THYROID  Result Date: 09/11/2021 CLINICAL DATA:  Thyroid nodule follow-up EXAM: THYROID ULTRASOUND TECHNIQUE: Ultrasound examination of the thyroid gland and adjacent soft tissues was performed. COMPARISON:  None. FINDINGS: Parenchymal Echotexture: Markedly heterogenous Isthmus: 0.3 cm Right lobe: 4.3 x 1.9 x 1.4 cm Left lobe: 3.7 x 1.1 x 1.8 cm _________________________________________________________ Estimated total number of nodules >/= 1 cm: 0 Number of spongiform nodules >/=  2 cm not described below (TR1): 0 Number of mixed cystic and solid nodules >/= 1.5 cm not described below (TR2): 0 _________________________________________________________ 0.7 cm cystic nodule, containing colloid, in the inferior right thyroid lobe does not meet criteria for FNA or imaging surveillance.  IMPRESSION: Marked diffuse heterogeneity of the thyroid parenchyma without discrete suspicious nodule. The above is in keeping with the ACR TI-RADS recommendations - J Am Coll Radiol 2017;14:587-595. Electronically Signed   By: Acquanetta Belling M.D.   On: 09/11/2021 15:47    Assessment & Plan:  .Encounter for screening mammogram for malignant neoplasm of breast  Postmenopausal estrogen deficiency  Type 2 diabetes mellitus with diabetic neuropathy, without long-term current use of insulin (HCC)  Hyperlipidemia LDL goal <100  Left thyroid nodule  Nausea     I provided 30 minutes of face-to-face time during this encounter reviewing  patient's last visit with me, patient's  most recent visit with cardiology,  nephrology,  and neurology,  recent surgical and non surgical procedures, previous  labs and imaging studies, counseling on currently addressed issues,  and post visit ordering to diagnostics and therapeutics .   Follow-up: No follow-ups on file.   Sherlene Shams, MD

## 2023-06-21 NOTE — Assessment & Plan Note (Addendum)
Her neuropathy is managed with gabapentin.  Her diabetes is Currently well managed with  Ozempic only and she has had an EXCESSIVE weight loss using 1 mg weekly.  Dose reduced to 0.5 mg weekly   conversion table given for reduction in dose using available supply of 1 mg  pens.  She has a metformin intolerance.  Continue statin  Lab Results  Component Value Date   HGBA1C 6.0 11/23/2022   Lab Results  Component Value Date   MICROALBUR 1.0 11/23/2022   MICROALBUR <0.7 08/28/2021    Lab Results  Component Value Date   CREATININE 0.77 11/23/2022

## 2023-06-23 ENCOUNTER — Encounter: Payer: Self-pay | Admitting: Internal Medicine

## 2023-06-23 DIAGNOSIS — F329 Major depressive disorder, single episode, unspecified: Secondary | ICD-10-CM | POA: Insufficient documentation

## 2023-06-23 DIAGNOSIS — E0590 Thyrotoxicosis, unspecified without thyrotoxic crisis or storm: Secondary | ICD-10-CM | POA: Insufficient documentation

## 2023-06-23 NOTE — Assessment & Plan Note (Signed)
Etiology unclear.  Additional labs needed.    Lab Results  Component Value Date   TSH <0.01 Repeated and verified X2. (L) 06/21/2023

## 2023-06-23 NOTE — Assessment & Plan Note (Signed)
Previously attributed to OZempic.  Now occurring on average 3/week .  Reducing dose of ozempic to 1 mg weekly due to excessive weight loss

## 2023-06-23 NOTE — Assessment & Plan Note (Signed)
Aggravated by life stressors  of financial instability  Managed with cymbalta ;  no changes to regimen , continue 10 mg valium at night .

## 2023-06-23 NOTE — Assessment & Plan Note (Addendum)
With anhedonia,   low libido, lack of motiviation primary symptoms.  Initiate wellbutrin,  continue cymbalta .  Follow up 2-4 weeks

## 2023-06-23 NOTE — Addendum Note (Signed)
Addended by: Sherlene Shams on: 06/23/2023 10:48 PM   Modules accepted: Orders

## 2023-06-25 NOTE — Telephone Encounter (Signed)
Faxing, please be on the lookout, thanks

## 2023-06-25 NOTE — Telephone Encounter (Signed)
Have not received

## 2023-06-26 ENCOUNTER — Telehealth: Payer: Self-pay

## 2023-06-26 NOTE — Telephone Encounter (Signed)
Patient states she received an email from Dr. Duncan Dull, which changed the plan they had in place once her lab work came back.  Patient states she would like to speak with [place] for clarification.

## 2023-07-01 ENCOUNTER — Telehealth: Payer: Self-pay

## 2023-07-01 ENCOUNTER — Encounter: Payer: Self-pay | Admitting: Internal Medicine

## 2023-07-01 NOTE — Telephone Encounter (Signed)
LMTCB in regards to lab results.  

## 2023-07-01 NOTE — Telephone Encounter (Signed)
Spoke with pt and she has stopped the Ozempic due to her lab results and possible pancreatitis. Pt is concerned though because she is now not on anything for her diabetes. Pt would like to know if she needs to be on something else to control her diabetes.

## 2023-07-01 NOTE — Telephone Encounter (Signed)
Patient states she is returning call from Sandy Salaam, CMA.  I was unable to reach [place] at the time of the call.  I let patient know that I will leave her a message so she can call her back.

## 2023-07-01 NOTE — Telephone Encounter (Signed)
See result note message

## 2023-07-08 ENCOUNTER — Telehealth: Payer: Self-pay | Admitting: Internal Medicine

## 2023-07-08 DIAGNOSIS — E0590 Thyrotoxicosis, unspecified without thyrotoxic crisis or storm: Secondary | ICD-10-CM

## 2023-07-08 NOTE — Telephone Encounter (Signed)
Pt called stating Dr. Darrick Huntsman sent a referral to [facility], pt states [place] is about an hr drive for her. Pt asked if there was a office that's closer to her? Pt mentioned Dr. Darrick Huntsman advised her to stop taking ozempic. Pt asked what should she do with all the leftover meds she has? Please advise. Call back # [phone]

## 2023-07-08 NOTE — Telephone Encounter (Signed)
LMTCB

## 2023-07-09 NOTE — Telephone Encounter (Signed)
Patient states she is returning a call from Sandy Salaam, CMA.  I read message from Dr. Duncan Dull to patient.  I scheduled an appointment for patient to see Dr. Darrick Huntsman on 07/24/2023.

## 2023-07-09 NOTE — Addendum Note (Signed)
Addended by: Sandy Salaam on: 07/09/2023 10:32 AM   Modules accepted: Orders

## 2023-07-09 NOTE — Telephone Encounter (Signed)
Referral has been placed. 

## 2023-07-10 ENCOUNTER — Other Ambulatory Visit (INDEPENDENT_AMBULATORY_CARE_PROVIDER_SITE_OTHER): Payer: Medicare Other

## 2023-07-10 DIAGNOSIS — E0590 Thyrotoxicosis, unspecified without thyrotoxic crisis or storm: Secondary | ICD-10-CM | POA: Diagnosis not present

## 2023-07-10 DIAGNOSIS — R748 Abnormal levels of other serum enzymes: Secondary | ICD-10-CM

## 2023-07-11 LAB — LIPASE: Lipase: 87 U/L — ABNORMAL HIGH (ref 7–60)

## 2023-07-11 NOTE — Addendum Note (Signed)
Addended by: Sherlene Shams on: 07/11/2023 08:26 PM   Modules accepted: Orders

## 2023-07-17 ENCOUNTER — Other Ambulatory Visit (INDEPENDENT_AMBULATORY_CARE_PROVIDER_SITE_OTHER): Payer: Medicare Other

## 2023-07-17 DIAGNOSIS — E0590 Thyrotoxicosis, unspecified without thyrotoxic crisis or storm: Secondary | ICD-10-CM

## 2023-07-18 LAB — THYROID PANEL WITH TSH
Free Thyroxine Index: 3.1 (ref 1.4–3.8)
Free Thyroxine Index: 2.9 (ref 1.4–3.8)
T3 Uptake: 31 % (ref 22–35)
T3 Uptake: 29 % (ref 22–35)
T4, Total: 10 ug/dL (ref 5.1–11.9)
T4, Total: 10 ug/dL (ref 5.1–11.9)
TSH: 0.01 m[IU]/L — ABNORMAL LOW (ref 0.40–4.50)
TSH: 0.01 m[IU]/L — ABNORMAL LOW (ref 0.40–4.50)

## 2023-07-18 LAB — THYROGLOBULIN LEVEL: Thyroglobulin: 100.3 ng/mL — ABNORMAL HIGH

## 2023-07-18 LAB — THYROID PEROXIDASE ANTIBODY
Thyroperoxidase Ab SerPl-aCnc: 2 [IU]/mL (ref <9)
Thyroperoxidase Ab SerPl-aCnc: 1 [IU]/mL (ref <9)

## 2023-07-18 LAB — THYROID STIMULATING IMMUNOGLOBULIN

## 2023-07-18 LAB — THYROGLOBULIN ANTIBODY: Thyroglobulin Ab: <1 [IU]/mL (ref <=1)

## 2023-07-21 ENCOUNTER — Encounter: Payer: Self-pay | Admitting: Internal Medicine

## 2023-07-24 ENCOUNTER — Ambulatory Visit (INDEPENDENT_AMBULATORY_CARE_PROVIDER_SITE_OTHER): Payer: Medicare Other | Admitting: Internal Medicine

## 2023-07-24 ENCOUNTER — Encounter: Payer: Self-pay | Admitting: Internal Medicine

## 2023-07-24 VITALS — BP 118/60 | HR 88 | Ht 60.0 in | Wt 106.8 lb

## 2023-07-24 DIAGNOSIS — E0590 Thyrotoxicosis, unspecified without thyrotoxic crisis or storm: Secondary | ICD-10-CM | POA: Diagnosis not present

## 2023-07-24 DIAGNOSIS — F411 Generalized anxiety disorder: Secondary | ICD-10-CM | POA: Diagnosis not present

## 2023-07-24 DIAGNOSIS — F329 Major depressive disorder, single episode, unspecified: Secondary | ICD-10-CM | POA: Diagnosis not present

## 2023-07-24 DIAGNOSIS — E1140 Type 2 diabetes mellitus with diabetic neuropathy, unspecified: Secondary | ICD-10-CM

## 2023-07-24 DIAGNOSIS — E041 Nontoxic single thyroid nodule: Secondary | ICD-10-CM | POA: Diagnosis not present

## 2023-07-24 DIAGNOSIS — K760 Fatty (change of) liver, not elsewhere classified: Secondary | ICD-10-CM

## 2023-07-24 DIAGNOSIS — E871 Hypo-osmolality and hyponatremia: Secondary | ICD-10-CM | POA: Diagnosis not present

## 2023-07-24 DIAGNOSIS — E785 Hyperlipidemia, unspecified: Secondary | ICD-10-CM

## 2023-07-24 NOTE — Patient Instructions (Addendum)
For your anxiety:  Reduce wellbutrin dose back to  150 mg daily  After 2 weeks if no improvement in anxiety,  increase dose of cymbalta  to 60 mg daily (and let me know so I can refill accordingly)  Start walking for exercise  5 -10 minutes to start.  Increase as tolerated   Protein needs:  a  minimum of 60 grams daily   Monitor your blood sugars once daily   at variable times /variable meals  Fastings should be 130 or less  Post prandials  ( 2hrs after eating) should  be 160 or less

## 2023-07-24 NOTE — Progress Notes (Unsigned)
Subjective:  Patient ID: Kristie Jones, female    DOB: 09/13/1955  Age: 67 y.o. MRN: [id number]  CC: There were no encounter diagnoses.   HPI Kristie Jones presents for  Chief Complaint  Patient presents with   discuss lab results   Overactive thryoid  Jamison Oka tloss       Outpatient Medications Prior to Visit  Medication Sig Dispense Refill   atorvastatin (LIPITOR) 40 MG tablet Take 1 tablet (40 mg total) by mouth daily. 90 tablet 1   blood glucose meter kit and supplies KIT Dispense based on patient and insurance preference. Use up to four times daily as directed. (Patient taking differently: Inject 1 each into the skin as directed. Dispense based on patient and insurance preference. Use up to four times daily as directed.) 1 each 11   buPROPion (WELLBUTRIN XL) 150 MG 24 hr tablet Take 1 tablet (150 mg total) by mouth daily. (Patient taking differently: Take 300 mg by mouth daily.) 90 tablet 1   diazepam (VALIUM) 10 MG tablet TAKE 1 TABLET AT BEDTIME AS NEEDED FOR ANXIETY 30 tablet 1   DULoxetine (CYMBALTA) 30 MG capsule TAKE 1 TABLET BY MOUTH ONCE DAILY. (TO BE TAKEN WITH 60MG  TABLET) ` 90 capsule 2   furosemide (LASIX) 20 MG tablet Take 1 tablet (20 mg total) by mouth daily. 90 tablet 3   ondansetron (ZOFRAN-ODT) 4 MG disintegrating tablet Take 1 tablet (4 mg total) by mouth every 8 (eight) hours as needed for nausea or vomiting. 45 tablet 11   promethazine (PHENERGAN) 12.5 MG tablet TAKE 1 TABLET AT BEDTIME AS NEEDED FOR NAUSEA & VOMITING 20 tablet 5   spironolactone (ALDACTONE) 50 MG tablet Take 1 tablet (50 mg total) by mouth daily. 90 tablet 1   traMADol (ULTRAM) 50 MG tablet TAKE TWO TABLETS BY MOUTH EVERY 6 HOURS AS NEEDED FOR PAIN 240 tablet 2   No facility-administered medications prior to visit.    Review of Systems;  Patient denies headache, fevers, malaise, unintentional weight loss, skin rash, eye pain, sinus congestion and sinus pain, sore throat, dysphagia,   hemoptysis , cough, dyspnea, wheezing, chest pain, palpitations, orthopnea, edema, abdominal pain, nausea, melena, diarrhea, constipation, flank pain, dysuria, hematuria, urinary  Frequency, nocturia, numbness, tingling, seizures,  Focal weakness, Loss of consciousness,  Tremor, insomnia, depression, anxiety, and suicidal ideation.      Objective:  BP 118/60   Pulse 88   Ht 5' (1.524 m)   Wt 106 lb 12.8 oz (48.4 kg)   SpO2 99%   BMI 20.86 kg/m   BP Readings from Last 3 Encounters:  07/24/23 118/60  06/21/23 108/60  11/23/22 (!) 108/58    Wt Readings from Last 3 Encounters:  07/24/23 106 lb 12.8 oz (48.4 kg)  06/21/23 100 lb 6.4 oz (45.5 kg)  11/23/22 131 lb (59.4 kg)    Physical Exam  Lab Results  Component Value Date   HGBA1C 5.6 06/21/2023   HGBA1C 6.0 11/23/2022   HGBA1C 6.0 03/28/2022    Lab Results  Component Value Date   CREATININE 0.81 06/21/2023   CREATININE 0.77 11/23/2022   CREATININE 0.89 03/28/2022    Lab Results  Component Value Date   WBC 7.2 06/21/2023   HGB 14.9 06/21/2023   HCT 44.6 06/21/2023   PLT 240.0 06/21/2023   GLUCOSE 88 06/21/2023   CHOL 146 06/21/2023   TRIG 75.0 06/21/2023   HDL 50.30 06/21/2023   LDLDIRECT 78.0 06/21/2023   LDLCALC 81 06/21/2023  ALT 13 06/21/2023   AST 17 06/21/2023   NA 137 06/21/2023   K 3.8 06/21/2023   CL 98 06/21/2023   CREATININE 0.81 06/21/2023   BUN 12 06/21/2023   CO2 33 (H) 06/21/2023   TSH 0.01 (L) 07/17/2023   INR 0.9 08/15/2020   HGBA1C 5.6 06/21/2023   MICROALBUR 1.0 11/23/2022    US THYROID Result Date: 09/11/2021 CLINICAL DATA:  Thyroid nodule follow-up EXAM: THYROID ULTRASOUND TECHNIQUE: Ultrasound examination of the thyroid gland and adjacent soft tissues was performed. COMPARISON:  None. FINDINGS: Parenchymal Echotexture: Markedly heterogenous Isthmus: 0.3 cm Right lobe: 4.3 x 1.9 x 1.4 cm Left lobe: 3.7 x 1.1 x 1.8 cm _________________________________________________________  Estimated total number of nodules >/= 1 cm: 0 Number of spongiform nodules >/=  2 cm not described below (TR1): 0 Number of mixed cystic and solid nodules >/= 1.5 cm not described below (TR2): 0 _________________________________________________________ 0.7 cm cystic nodule, containing colloid, in the inferior right thyroid lobe does not meet criteria for FNA or imaging surveillance. IMPRESSION: Marked diffuse heterogeneity of the thyroid parenchyma without discrete suspicious nodule. The above is in keeping with the ACR TI-RADS recommendations - J Am Coll Radiol 2017;14:587-595. Electronically Signed   By: Acquanetta Belling M.D.   On: 09/11/2021 15:47    Assessment & Plan:  .There are no diagnoses linked to this encounter.   I provided 30 minutes of face-to-face time during this encounter reviewing patient's last visit with me, patient's  most recent visit with cardiology,  nephrology,  and neurology,  recent surgical and non surgical procedures, previous  labs and imaging studies, counseling on currently addressed issues,  and post visit ordering to diagnostics and therapeutics .   Follow-up: No follow-ups on file.   Sherlene Shams, MD

## 2023-07-25 ENCOUNTER — Telehealth: Payer: Self-pay | Admitting: Internal Medicine

## 2023-07-25 MED ORDER — BUPROPION HCL ER (XL) 150 MG PO TB24: 150.0000 mg | ORAL_TABLET | Freq: Every day | ORAL | 1 refills | Status: DC

## 2023-07-25 NOTE — Assessment & Plan Note (Signed)
Managed with atorvastatin. LDL is at goal   Lab Results  Component Value Date   CHOL 146 06/21/2023   HDL 50.30 06/21/2023   LDLCALC 81 06/21/2023   LDLDIRECT 78.0 06/21/2023   TRIG 75.0 06/21/2023   CHOLHDL 3 06/21/2023

## 2023-07-25 NOTE — Assessment & Plan Note (Signed)
Last  ultrasound was  done Feb 2023.  No suspicious nodules , however with new onset hyperthyroidism accompanied by tachycardia and weight loss   will repeat

## 2023-07-25 NOTE — Assessment & Plan Note (Signed)
 Aggravated by life stressors  of financial instability  Managed with cymbalta ;  no changes to regimen , continue 10 mg valium at night .

## 2023-07-25 NOTE — Telephone Encounter (Signed)
Lft tp vm to call ofc to sch [place]. thanks

## 2023-07-25 NOTE — Assessment & Plan Note (Signed)
With anhedonia,   low libido, lack of motiviation primary symptoms.  Continue wellbutrin at 150 mg daily as higher dose may be aggravating her anxiety,  continue cymbalta .  Follow up 2-4 weeks

## 2023-07-25 NOTE — Assessment & Plan Note (Addendum)
Etiology  kay be Hashimot's thyroiditis based on thyroid antibody panel.  Endocrinology referral I progress. .thyroid  ultrasound ordered given use of Ozempic Lab Results  Component Value Date   TSH 0.01 (L) 07/17/2023

## 2023-07-25 NOTE — Assessment & Plan Note (Addendum)
Ut has Her neuropathy is managed with gabapentin.  Her diabetes is Currently well managed , but ozempic was started in April 2023 after thyroid ultrasound was repeated,  but  has been discontinued  due to excessive  weight loss even with reduced dose. Lipase was also noted to be elevated, making continued use contraindicated .  She has a metformin intolerance, and Invokanna aggravated her urinary incontinence.  .  Continue statin .  No hypoglycemics have been advised until more data can be elicited from patient regarding  glycemic patterns   Lab Results  Component Value Date   HGBA1C 5.6 06/21/2023   Lab Results  Component Value Date   MICROALBUR 1.0 11/23/2022   MICROALBUR <0.7 08/28/2021    Lab Results  Component Value Date   CREATININE 0.81 06/21/2023

## 2023-08-14 ENCOUNTER — Ambulatory Visit
Admission: RE | Admit: 2023-08-14 | Discharge: 2023-08-14 | Disposition: A | Discharge status: Home / Self Care | Payer: Medicare PPO | Source: Ambulatory Visit | Attending: Internal Medicine | Admitting: Internal Medicine

## 2023-08-14 DIAGNOSIS — E0590 Thyrotoxicosis, unspecified without thyrotoxic crisis or storm: Secondary | ICD-10-CM | POA: Insufficient documentation

## 2023-09-16 ENCOUNTER — Encounter: Payer: Self-pay | Admitting: Internal Medicine

## 2023-09-16 DIAGNOSIS — F329 Major depressive disorder, single episode, unspecified: Secondary | ICD-10-CM

## 2023-09-16 LAB — TSH: TSH: 0.07 — AB (ref 0.41–5.90)

## 2023-09-19 MED ORDER — BUPROPION HCL ER (XL) 150 MG PO TB24: 300.0000 mg | ORAL_TABLET | Freq: Every day | ORAL | 1 refills | Status: DC

## 2023-09-23 ENCOUNTER — Ambulatory Visit: Payer: Medicare PPO | Admitting: Internal Medicine

## 2023-09-23 ENCOUNTER — Telehealth: Payer: Self-pay

## 2023-09-23 ENCOUNTER — Encounter: Payer: Self-pay | Admitting: Pharmacy Technician

## 2023-09-23 ENCOUNTER — Other Ambulatory Visit (HOSPITAL_COMMUNITY): Payer: Self-pay

## 2023-09-23 ENCOUNTER — Encounter: Payer: Self-pay | Admitting: Internal Medicine

## 2023-09-23 VITALS — BP 138/70 | HR 89 | Ht 60.0 in | Wt 121.0 lb

## 2023-09-23 DIAGNOSIS — Z79899 Other long term (current) drug therapy: Secondary | ICD-10-CM | POA: Diagnosis not present

## 2023-09-23 DIAGNOSIS — F329 Major depressive disorder, single episode, unspecified: Secondary | ICD-10-CM

## 2023-09-23 DIAGNOSIS — E1140 Type 2 diabetes mellitus with diabetic neuropathy, unspecified: Secondary | ICD-10-CM

## 2023-09-23 DIAGNOSIS — K760 Fatty (change of) liver, not elsewhere classified: Secondary | ICD-10-CM

## 2023-09-23 DIAGNOSIS — Z1231 Encounter for screening mammogram for malignant neoplasm of breast: Secondary | ICD-10-CM

## 2023-09-23 DIAGNOSIS — E0590 Thyrotoxicosis, unspecified without thyrotoxic crisis or storm: Secondary | ICD-10-CM

## 2023-09-23 DIAGNOSIS — F411 Generalized anxiety disorder: Secondary | ICD-10-CM

## 2023-09-23 DIAGNOSIS — E871 Hypo-osmolality and hyponatremia: Secondary | ICD-10-CM

## 2023-09-23 NOTE — Telephone Encounter (Signed)
Pharmacy Patient Advocate Encounter   Received notification from Patient Pharmacy that prior authorization for Diazepam 10mg  tab is required/requested.   Insurance verification completed.   The patient is insured through [employer] .   Per test claim: PA required; PA submitted to above mentioned insurance via CoverMyMeds Key/confirmation #/EOC [id number] Status is pending

## 2023-09-23 NOTE — Telephone Encounter (Signed)
 Clinical questions have been answered and PA submitted. PA currently Pending. Please be advised that most companies allow up to 30 days to make a decision. We will advise when a determination has been made, or follow up in 1 week.   Please reach out to our team, Rx Prior Auth Pool, if you haven't heard back in a week.

## 2023-09-23 NOTE — Patient Instructions (Signed)
PLEASE SCHEDULE YOUR MAMMOGRAM   WE CAN INCREASE THE CYMBALTA DOSE IN 2 WEEKS IF NEEDED    NO MEDICATIONS NEEDED FOR DIABETES UNLESS YOU A1C IS > 7.0 TODAY

## 2023-09-23 NOTE — Progress Notes (Unsigned)
Subjective:  Patient ID: Kristie Jones, female    DOB: [birth year]  Age: 68 y.o. MRN: [id number]  CC: There were no encounter diagnoses.   HPI Kristie Jones presents for  Chief Complaint  Patient presents with   Medical Management of Chronic Issues    3 month follow up on diabetes    1) Graves hyperthyroidism:   she was referred to [facility] endocrinology after her last visit for suppressed TSH ,, weight loss.  has regained 15 lbs since stopping ozempic. And still gaining weight   Began taking methimazole in mid January 5 mg daily .  Having nausea and diarrhea  2/week,  controlled,  the nausea is occurring 4 days per week  controllled with meds.  Follow up   in March with Endocrincology.  She is gaining weight and does not want to gain much more .  Currently a size 4 .  She is working on a routine for exercise,  walking    2)  Type 2 DM  Tracking BS without the ozempic have been < 160   2) Worsening depression;  had another conflict with husband Feb 5;  " cried for 2 days "  has been distraught since then .  Felt s bad about herself. 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 Marland Kitchen  Has increased her wellbutrin per mychart      Outpatient Medications Prior to Visit  Medication Sig Dispense Refill   atorvastatin (LIPITOR) 40 MG tablet Take 1 tablet (40 mg total) by mouth daily. 90 tablet 1   blood glucose meter kit and supplies KIT Dispense based on patient and insurance preference. Use up to four times daily as directed. (Patient taking differently: Inject 1 each into the skin as directed. Dispense based on patient and insurance preference. Use up to four times daily as directed.) 1 each 11   buPROPion (WELLBUTRIN XL) 150 MG 24 hr tablet Take 2 tablets (300 mg total) by mouth daily. 180 tablet 1   diazepam (VALIUM) 10 MG tablet TAKE 1 TABLET AT BEDTIME AS NEEDED FOR ANXIETY 30 tablet 1   DULoxetine (CYMBALTA) 30 MG capsule  TAKE 1 TABLET BY MOUTH ONCE DAILY. (TO BE TAKEN WITH 60MG  TABLET) ` 90 capsule 2   DULoxetine (CYMBALTA) 60 MG capsule Take 60 mg by mouth daily.     furosemide (LASIX) 20 MG tablet Take 1 tablet (20 mg total) by mouth daily. 90 tablet 3   methimazole (TAPAZOLE) 5 MG tablet Take 5 mg by mouth daily.     ondansetron (ZOFRAN-ODT) 4 MG disintegrating tablet Take 1 tablet (4 mg total) by mouth every 8 (eight) hours as needed for nausea or vomiting. 45 tablet 11   promethazine (PHENERGAN) 12.5 MG tablet TAKE 1 TABLET AT BEDTIME AS NEEDED FOR NAUSEA & VOMITING 20 tablet 5   spironolactone (ALDACTONE) 50 MG tablet Take 1 tablet (50 mg total) by mouth daily. 90 tablet 1   traMADol (ULTRAM) 50 MG tablet TAKE TWO TABLETS BY MOUTH EVERY 6 HOURS AS NEEDED FOR PAIN 240 tablet 2   No facility-administered medications prior to visit.    Review of Systems;  Patient denies headache, fevers, malaise, unintentional weight loss, skin rash, eye pain, sinus congestion and sinus pain, sore throat, dysphagia,  hemoptysis , cough, dyspnea, wheezing, chest pain, palpitations, orthopnea, edema, abdominal pain, nausea, melena, diarrhea, constipation, flank pain, dysuria, hematuria, urinary  Frequency, nocturia, numbness, tingling,  seizures,  Focal weakness, Loss of consciousness,  Tremor, insomnia, depression, anxiety, and suicidal ideation.      Objective:  BP 138/70   Pulse 89   Ht 5' (1.524 m)   Wt 121 lb (54.9 kg)   SpO2 96%   BMI 23.63 kg/m   BP Readings from Last 3 Encounters:  09/23/23 138/70  07/24/23 118/60  06/21/23 108/60    Wt Readings from Last 3 Encounters:  09/23/23 121 lb (54.9 kg)  07/24/23 106 lb 12.8 oz (48.4 kg)  06/21/23 100 lb 6.4 oz (45.5 kg)    Physical Exam  Lab Results  Component Value Date   HGBA1C 5.6 06/21/2023   HGBA1C 6.0 11/23/2022   HGBA1C 6.0 03/28/2022    Lab Results  Component Value Date   CREATININE 0.81 06/21/2023   CREATININE 0.77 11/23/2022   CREATININE  0.89 03/28/2022    Lab Results  Component Value Date   WBC 7.2 06/21/2023   HGB 14.9 06/21/2023   HCT 44.6 06/21/2023   PLT 240.0 06/21/2023   GLUCOSE 88 06/21/2023   CHOL 146 06/21/2023   TRIG 75.0 06/21/2023   HDL 50.30 06/21/2023   LDLDIRECT 78.0 06/21/2023   LDLCALC 81 06/21/2023   ALT 13 06/21/2023   AST 17 06/21/2023   NA 137 06/21/2023   K 3.8 06/21/2023   CL 98 06/21/2023   CREATININE 0.81 06/21/2023   BUN 12 06/21/2023   CO2 33 (H) 06/21/2023   TSH 0.01 (L) 07/17/2023   INR 0.9 08/15/2020   HGBA1C 5.6 06/21/2023   MICROALBUR 1.0 11/23/2022    US THYROID Result Date: 08/17/2023 CLINICAL DATA:  Hyperthyroid. EXAM: THYROID ULTRASOUND TECHNIQUE: Ultrasound examination of the thyroid gland and adjacent soft tissues was performed. COMPARISON:  09/11/2021 FINDINGS: Parenchymal Echotexture: Markedly heterogenous Isthmus: Normal in size measuring 0.2 cm in diameter, unchanged Right lobe: Slightly atrophic measuring 4.2 x 2.0 x 1.2 cm, previously, 4.3 x 1.9 x 1.4 cm Left lobe: Slightly atrophic measuring 3.0 x 1.4 x 1.2 cm, previously, 3.7 x 1.8 x 1.1 cm _________________________________________________________ Estimated total number of nodules >/= 1 cm: 2 Number of spongiform nodules >/=  2 cm not described below (TR1): 0 Number of mixed cystic and solid nodules >/= 1.5 cm not described below (TR2): 0 _________________________________________________________ There is a 1.2 x 1.0 x 0.9 cm isoechoic ill-defined nodule/pseudonodule within mid aspect of the right lobe of the thyroid (labeled 1), which does not meet criteria to recommend percutaneous sampling or continued dedicated follow-up. There is a 1.0 x 0.9 x 0.7 cm isoechoic ill-defined nodule/pseudonodule within the mid aspect of the left lobe of the thyroid (labeled 2), which does not meet criteria to recommend percutaneous sampling or continued dedicated follow-up. IMPRESSION: Similar appearing atrophic and markedly heterogeneous  appearing thyroid without worrisome nodule or mass. The above is in keeping with the ACR TI-RADS recommendations - J Am Coll Radiol 2017;14:587-595. Electronically Signed   By: Simonne Come M.D.   On: 08/17/2023 10:20    Assessment & Plan:  .There are no diagnoses linked to this encounter.   I provided 30 minutes of face-to-face time during this encounter reviewing patient's last visit with me, patient's  most recent visit with cardiology,  nephrology,  and neurology,  recent surgical and non surgical procedures, previous  labs and imaging studies, counseling on currently addressed issues,  and post visit ordering to diagnostics and therapeutics .   Follow-up: No follow-ups on file.   Sherlene Shams, MD

## 2023-09-23 NOTE — Telephone Encounter (Signed)
error 

## 2023-09-24 ENCOUNTER — Other Ambulatory Visit (HOSPITAL_COMMUNITY): Payer: Self-pay

## 2023-09-24 LAB — LIPID PANEL
Cholesterol: 150 mg/dL (ref 0–200)
HDL: 96 mg/dL (ref >39.00)
LDL Cholesterol: 40 mg/dL (ref 0–99)
NonHDL: 53.61
Total CHOL/HDL Ratio: 2
Triglycerides: 69 mg/dL (ref 0.0–149.0)
VLDL: 13.8 mg/dL (ref 0.0–40.0)

## 2023-09-24 LAB — COMPREHENSIVE METABOLIC PANEL
ALT: 45 U/L — ABNORMAL HIGH (ref 0–35)
AST: 36 U/L (ref 0–37)
Albumin: 4.3 g/dL (ref 3.5–5.2)
Alkaline Phosphatase: 183 U/L — ABNORMAL HIGH (ref 39–117)
BUN: 16 mg/dL (ref 6–23)
CO2: 28 meq/L (ref 19–32)
Calcium: 8.8 mg/dL (ref 8.4–10.5)
Chloride: 99 meq/L (ref 96–112)
Creatinine, Ser: 0.8 mg/dL (ref 0.40–1.20)
GFR: 77.39 mL/min (ref >60.00)
Glucose, Bld: 86 mg/dL (ref 70–99)
Potassium: 4.1 meq/L (ref 3.5–5.1)
Sodium: 135 meq/L (ref 135–145)
Total Bilirubin: 0.7 mg/dL (ref 0.2–1.2)
Total Protein: 7.3 g/dL (ref 6.0–8.3)

## 2023-09-24 LAB — LDL CHOLESTEROL, DIRECT: Direct LDL: 34 mg/dL

## 2023-09-24 LAB — HEMOGLOBIN A1C: Hgb A1c MFr Bld: 5.8 % (ref 4.6–6.5)

## 2023-09-24 LAB — LIPASE: Lipase: 47 U/L (ref 11.0–59.0)

## 2023-09-24 NOTE — Assessment & Plan Note (Signed)
Aggravated by life stressors  of financial instability  Managed with cymbalta ;  no changes to regimen ,  but will consider increasing dose in the near future if the increase in wellbutrin causes increased anxiety symptoms.  continue 10 mg valium at night  prn insomnia .

## 2023-09-24 NOTE — Assessment & Plan Note (Signed)
Well controlled currently on diet alone .  Her neuropathy is managed with gabapentin.   ozempic was stopped in November after excessive weight loss since initiation in  April 2023 after thyroid ultrasound was repeated  Lipase was also noted to be elevated, making continued use contraindicated .  She has a metformin intolerance, and Invokanna aggravated her urinary incontinence.  .  Continue statin .  No hypoglycemics have been advised until more data can be elicited from patient regarding  glycemic patterns   Lab Results  Component Value Date   HGBA1C 5.6 06/21/2023   Lab Results  Component Value Date   MICROALBUR 1.0 11/23/2022   MICROALBUR <0.7 08/28/2021    Lab Results  Component Value Date   CREATININE 0.81 06/21/2023

## 2023-09-24 NOTE — Assessment & Plan Note (Signed)
Presumed by ultrasound changes and serologies negative for autoimmune causes of hepatitis.  Current liver enzymes are normal except for mildly elevated alk pos ;  all modifiable risk  factors including obesity, diabetes and hyperlipidemia have been addressed   Lab Results  Component Value Date   ALT 13 06/21/2023   AST 17 06/21/2023   ALKPHOS 119 (H) 06/21/2023   BILITOT 0.7 06/21/2023

## 2023-09-24 NOTE — Telephone Encounter (Signed)
Pharmacy Patient Advocate Encounter  Received notification from [facility] that Prior Authorization for Diazepam 10mg  tab  has been APPROVED from 09/23/2023 to 08/05/2024. Ran test claim, Copay is $1.38. This test claim was processed through [facility]- copay amounts may vary at other pharmacies due to pharmacy/plan contracts, or as the patient moves through the different stages of their insurance plan.   PA #/Case ID/Reference #: [id number]

## 2023-09-24 NOTE — Assessment & Plan Note (Addendum)
Etiology  considered to be Grave's  per patient but not confirmed with ab studies and not classified as Graves by Endocrinology.   However she was prescribed methimazole  Lab Results  Component Value Date   TSH 0.01 (L) 07/17/2023

## 2023-09-24 NOTE — Assessment & Plan Note (Signed)
Secondary to overdiuresis,  Currently resolved.

## 2023-09-24 NOTE — Assessment & Plan Note (Signed)
Wellbutrin dose increased several days ago,

## 2023-09-26 ENCOUNTER — Encounter: Payer: Self-pay | Admitting: Internal Medicine

## 2023-10-02 ENCOUNTER — Other Ambulatory Visit: Payer: Self-pay | Admitting: Internal Medicine

## 2023-10-02 DIAGNOSIS — Z760 Encounter for issue of repeat prescription: Secondary | ICD-10-CM

## 2023-10-18 ENCOUNTER — Ambulatory Visit (INDEPENDENT_AMBULATORY_CARE_PROVIDER_SITE_OTHER): Payer: Medicare Other | Admitting: *Deleted

## 2023-10-18 VITALS — Ht 60.0 in | Wt 123.0 lb

## 2023-10-18 DIAGNOSIS — Z0000 Encounter for general adult medical examination without abnormal findings: Secondary | ICD-10-CM | POA: Diagnosis not present

## 2023-10-18 NOTE — Progress Notes (Signed)
 Subjective:   Kristie Jones is a 68 y.o. who presents for a Medicare Wellness preventive visit.  Visit Complete: Virtual I connected with  [name] on 10/18/23 by a audio enabled telemedicine application and verified that I am speaking with the correct person using two identifiers.  Patient Location: Home  Provider Location: Home Office  I discussed the limitations of evaluation and management by telemedicine. The patient expressed understanding and agreed to proceed.  Vital Signs: Because this visit was a virtual/telehealth visit, some criteria may be missing or patient reported. Any vitals not documented were not able to be obtained and vitals that have been documented are patient reported.  VideoDeclined- This patient declined Librarian, academic. Therefore the visit was completed with audio only.  Persons Participating in Visit: Patient.  AWV Questionnaire: Yes: Patient Medicare AWV questionnaire was completed by the patient on 10/17/23; I have confirmed that all information answered by patient is correct and no changes since this date.  Cardiac Risk Factors include: advanced age (>9men, >48 women);diabetes mellitus;dyslipidemia;smoking/ tobacco exposure     Objective:    Today's Vitals   10/18/23 1010  Weight: 123 lb (55.8 kg)  Height: 5' (1.524 m)   Body mass index is 24.02 kg/m.     10/18/2023   10:23 AM 10/18/2022    9:48 AM 10/11/2021    9:21 AM 05/25/2021    9:56 AM 12/06/2020   11:23 AM 11/22/2020    1:41 PM 05/09/2016    8:16 AM  Advanced Directives  Does Patient Have a Medical Advance Directive? No No No No No No No  Does patient want to make changes to medical advance directive?  No - Patient declined       Would patient like information on creating a medical advance directive? No - Patient declined  No - Patient declined  No - Patient declined  Yes - Educational materials given    Current Medications (verified) Outpatient  Encounter Medications as of 10/18/2023  Medication Sig   atorvastatin (LIPITOR) 40 MG tablet Take 1 tablet (40 mg total) by mouth daily.   blood glucose meter kit and supplies KIT Dispense based on patient and insurance preference. Use up to four times daily as directed. (Patient taking differently: Inject 1 each into the skin as directed. Dispense based on patient and insurance preference. Use up to four times daily as directed.)   buPROPion (WELLBUTRIN XL) 150 MG 24 hr tablet Take 2 tablets (300 mg total) by mouth daily.   diazepam (VALIUM) 10 MG tablet TAKE 1 TABLET AT BEDTIME AS NEEDED FOR ANXIETY   DULoxetine (CYMBALTA) 30 MG capsule TAKE 1 TABLET BY MOUTH ONCE DAILY. (TO BE TAKEN WITH 60MG  TABLET) `   furosemide (LASIX) 20 MG tablet Take 1 tablet (20 mg total) by mouth daily.   methimazole (TAPAZOLE) 5 MG tablet Take 5 mg by mouth daily.   ondansetron (ZOFRAN-ODT) 4 MG disintegrating tablet Take 1 tablet (4 mg total) by mouth every 8 (eight) hours as needed for nausea or vomiting.   promethazine (PHENERGAN) 12.5 MG tablet TAKE 1 TABLET AT BEDTIME AS NEEDED FOR NAUSEA & VOMITING   spironolactone (ALDACTONE) 50 MG tablet Take 1 tablet (50 mg total) by mouth daily.   traMADol (ULTRAM) 50 MG tablet TAKE TWO TABLETS BY MOUTH EVERY 6 HOURS AS NEEDED FOR PAIN   DULoxetine (CYMBALTA) 60 MG capsule Take 60 mg by mouth daily. (Patient not taking: Reported on 10/18/2023)   No  facility-administered encounter medications on file as of 10/18/2023.    Allergies (verified) Ciprofloxacin, Penicillins, Sulfamethoxazole-trimethoprim, Cefdinir, Iodinated contrast media, Methocarbamol, Ozempic (0.25 or 0.5 mg-dose) [semaglutide(0.25 or 0.5mg -dos)], and Levaquin [levofloxacin]   History: Past Medical History:  Diagnosis Date   Allergy    Arthritis    Cancer (HCC)    MULTIPLE SQUAMOUS CELL-WRIST, BUTTOCKS, ABDOMEN, LIP   Chicken pox    Chronic post-traumatic stress disorder (PTSD) 09/05/2017   Complication  of anesthesia    PT GETS VERY ANXIOUS PRIOR TO ANESTHESIA AND WILL START JERKING MASK OFF   Depression    Diabetes mellitus without complication (HCC)    Endometriosis    Headache    Hyperlipidemia associated with type 2 diabetes mellitus (HCC)    Lower extremity edema    Tobacco abuse    Past Surgical History:  Procedure Laterality Date   ABDOMINAL HYSTERECTOMY     COLONOSCOPY WITH PROPOFOL N/A 05/25/2021   Procedure: COLONOSCOPY WITH PROPOFOL;  Surgeon: Midge Minium, MD;  Location: [facility] ENDOSCOPY;  Service: Endoscopy;  Laterality: N/A;   CYSTOSCOPY     X3   DIAGNOSTIC LAPAROSCOPY     ESOPHAGOGASTRODUODENOSCOPY (EGD) WITH PROPOFOL N/A 05/25/2021   Procedure: ESOPHAGOGASTRODUODENOSCOPY (EGD) WITH PROPOFOL;  Surgeon: Midge Minium, MD;  Location: [facility] ENDOSCOPY;  Service: Endoscopy;  Laterality: N/A;   MOHS SURGERY     PAROTIDECTOMY Left 06/20/2015   Procedure: PAROTIDECTOMY;  Surgeon: Vernie Murders, MD;  Location: [facility] ORS;  Service: ENT;  Laterality: Left;   Family History  Problem Relation Age of Onset   Arthritis Mother    Hypertension Mother    Pancreatic cancer Maternal Aunt    Bone cancer Maternal Aunt    Diabetes Mellitus I Maternal Uncle        CAD    Vasculitis Maternal Aunt        Wegener's    Social History   Socioeconomic History   Marital status: Married    Spouse name: Not on file   Number of children: Not on file   Years of education: Not on file   Highest education level: Some college, no degree  Occupational History   Occupation: unemployed    Comment: lost job 04/03/2018  Tobacco Use   Smoking status: Every Day    Current packs/day: 0.50    Average packs/day: 0.5 packs/day for 20.0 years (10.0 ttl pk-yrs)    Types: Cigarettes   Smokeless tobacco: Never  Vaping Use   Vaping status: Former  Substance and Sexual Activity   Alcohol use: Yes    Comment: Rarely   Drug use: No   Sexual activity: Yes  Other Topics Concern   Not on file  Social  History Narrative   Married   reitred   Social Drivers of Health   Financial Resource Strain: Low Risk  (10/18/2023)   Overall Financial Resource Strain (CARDIA)    Difficulty of Paying Living Expenses: Not hard at all  Food Insecurity: No Food Insecurity (10/18/2023)   Hunger Vital Sign    Worried About Running Out of Food in the Last Year: Never true    Ran Out of Food in the Last Year: Never true  Transportation Needs: No Transportation Needs (10/18/2023)   PRAPARE - Administrator, Civil Service (Medical): No    Lack of Transportation (Non-Medical): No  Physical Activity: Inactive (10/18/2023)   Exercise Vital Sign    Days of Exercise per Week: 0 days    Minutes of Exercise per  Session: 0 min  Stress: Stress Concern Present (10/18/2023)   [employer] of Occupational Health - Occupational Stress Questionnaire    Feeling of Stress : To some extent  Social Connections: Moderately Isolated (10/18/2023)   Social Connection and Isolation Panel [NHANES]    Frequency of Communication with Friends and Family: More than three times a week    Frequency of Social Gatherings with Friends and Family: Three times a week    Attends Religious Services: Never    Active Member of Clubs or Organizations: No    Attends Banker Meetings: Never    Marital Status: Married    Tobacco Counseling Ready to quit: No Counseling given: Not Answered    Clinical Intake:  Pre-visit preparation completed: Yes  Pain : No/denies pain     BMI - recorded: 24.02 Nutritional Status: BMI of 19-24  Normal Nutritional Risks: None Diabetes: Yes CBG done?: No Did pt. bring in CBG monitor from home?: No  How often do you need to have someone help you when you read instructions, pamphlets, or other written materials from your doctor or pharmacy?: 1 - Never  Interpreter Needed?: No  Information entered by :: R. Maron Stanzione LPN   Activities of Daily Living     10/17/2023    8:50 AM   In your present state of health, do you have any difficulty performing the following activities:  Hearing? 0  Vision? 0  Difficulty concentrating or making decisions? 0  Walking or climbing stairs? 0  Dressing or bathing? 0  Doing errands, shopping? 0  Preparing Food and eating ? N  Using the Toilet? N  In the past six months, have you accidently leaked urine? Y  Do you have problems with loss of bowel control? N  Managing your Medications? N  Managing your Finances? N  Housekeeping or managing your Housekeeping? N    Patient Care Team: Sherlene Shams, MD as PCP - General (Internal Medicine) Debbe Odea, MD as PCP - Cardiology (Cardiology)  Indicate any recent Medical Services you may have received from other than [facility] providers in the past year (date may be approximate).     Assessment:   This is a routine wellness examination for [place].  Hearing/Vision screen Hearing Screening - Comments:: No issues Vision Screening - Comments:: readers   Goals Addressed             This Visit's Progress    Patient Stated       Wants to start a regular exercise program       Depression Screen     10/18/2023   10:18 AM 09/23/2023    2:13 PM 07/24/2023    2:45 PM 06/21/2023   10:39 AM 11/23/2022   11:06 AM 10/18/2022    9:46 AM 03/28/2022    4:47 PM  PHQ 2/9 Scores  PHQ - 2 Score 2 6  4 4  0 4  PHQ- 9 Score 4 13  10 13  10   Exception Documentation   Patient refusal        Fall Risk     10/17/2023    8:50 AM 09/23/2023    2:13 PM 11/23/2022   11:03 AM 10/17/2022    8:34 PM 03/28/2022    4:02 PM  Fall Risk   Falls in the past year? 1 0 0 0 0  Number falls in past yr: 0 0  0   Injury with Fall? 0 0  0   Risk for fall  due to : History of fall(s);Impaired balance/gait No Fall Risks   No Fall Risks  Follow up Falls evaluation completed;Falls prevention discussed Falls evaluation completed  Falls evaluation completed;Falls prevention discussed Falls evaluation  completed    MEDICARE RISK AT HOME:  Medicare Risk at Home Any stairs in or around the home?: (Patient-Rptd) Yes If so, are there any without handrails?: (Patient-Rptd) No Home free of loose throw rugs in walkways, pet beds, electrical cords, etc?: (Patient-Rptd) No Adequate lighting in your home to reduce risk of falls?: (Patient-Rptd) Yes Life alert?: (Patient-Rptd) No Use of a cane, walker or w/c?: (Patient-Rptd) No Grab bars in the bathroom?: (Patient-Rptd) No Shower chair or bench in shower?: (Patient-Rptd) No Elevated toilet seat or a handicapped toilet?: (Patient-Rptd) No  TIMED UP AND GO:  Was the test performed?  No  Cognitive Function: 6CIT completed        10/18/2023   10:24 AM 10/18/2022    9:46 AM  6CIT Screen  What Year? 0 points 0 points  What month? 0 points 0 points  What time? 0 points 0 points  Count back from 20 0 points 0 points  Months in reverse 0 points 0 points  Repeat phrase 2 points 0 points  Total Score 2 points 0 points    Immunizations Immunization History  Administered Date(s) Administered   Influenza,inj,Quad PF,6+ Mos 08/15/2020, 08/30/2021   Influenza-Unspecified 04/26/2014, 05/29/2015, 06/05/2016, 05/31/2017   Janssen (J&J) SARS-COV-2 Vaccination 10/23/2019   Pneumococcal Conjugate-13 08/15/2020   Pneumococcal Polysaccharide-23 01/22/2014   Tdap 10/09/2012    Screening Tests Health Maintenance  Topic Date Due   Zoster Vaccines- Shingrix (1 of 2) Never done   DTaP/Tdap/Td (2 - Td or Tdap) 10/10/2022   Pneumonia Vaccine 64+ Years old (3 of 3 - PPSV23 or PCV20) 06/11/2023   INFLUENZA VACCINE  11/04/2023 (Originally 03/07/2023)   MAMMOGRAM  07/23/2024 (Originally 06/10/1976)   DEXA SCAN  07/23/2024 (Originally 06/11/2023)   OPHTHALMOLOGY EXAM  08/05/2024 (Originally [birth year]/10/1968)   Diabetic kidney evaluation - Urine ACR  11/23/2023   HEMOGLOBIN A1C  03/22/2024   Diabetic kidney evaluation - eGFR measurement  09/22/2024   FOOT EXAM   09/22/2024   Medicare Annual Wellness (AWV)  10/17/2024   Colonoscopy  05/25/2026   Hepatitis C Screening  Completed   HPV VACCINES  Aged Out   COVID-19 Vaccine  Discontinued   HIV Screening  Discontinued    Health Maintenance  Health Maintenance Due  Topic Date Due   Zoster Vaccines- Shingrix (1 of 2) Never done   DTaP/Tdap/Td (2 - Td or Tdap) 10/10/2022   Pneumonia Vaccine 46+ Years old (3 of 3 - PPSV23 or PCV20) 06/11/2023   Health Maintenance Items Addressed: Patient declines flu vaccine.. Patient was reminded of  orders previously placed for her mammogram and bone density. Patient was encouraged to update her vaccines tetanus, pneumonia and shingles vaccines.  Additional Screening:  Vision Screening: Recommended annual ophthalmology exams for early detection of glaucoma and other disorders of the eye. Not up to date. Patient stated that's she is calling to schedule an eye exam at [facility]  Dental Screening: Recommended annual dental exams for proper oral hygiene  Community Resource Referral / Chronic Care Management: CRR required this visit?  No   CCM required this visit?  No     Plan:     I have personally reviewed and noted the following in the patient's chart:   Medical and social history Use of alcohol, tobacco or  illicit drugs  Current medications and supplements including opioid prescriptions. Patient is currently taking opioid prescriptions. Information provided to patient regarding non-opioid alternatives. Patient advised to discuss non-opioid treatment plan with their provider. Functional ability and status Nutritional status Physical activity Advanced directives List of other physicians Hospitalizations, surgeries, and ER visits in previous 12 months Vitals Screenings to include cognitive, depression, and falls Referrals and appointments  In addition, I have reviewed and discussed with patient certain preventive protocols, quality metrics,  and best practice recommendations. A written personalized care plan for preventive services as well as general preventive health recommendations were provided to patient.     Sydell Axon, LPN   [id number]   After Visit Summary: (MyChart) Due to this being a telephonic visit, the after visit summary with patients personalized plan was offered to patient via MyChart   Notes: Nothing significant to report at this time.

## 2023-10-18 NOTE — Patient Instructions (Addendum)
 [name] , Thank you for taking time to come for your Medicare Wellness Visit. I appreciate your ongoing commitment to your health goals. Please review the following plan we discussed and let me know if I can assist you in the future.   Referrals/Orders/Follow-Ups/Clinician Recommendations: Remember to call and schedule your mammogram and bone density.  Consider updating your shingles, tetanus and pneumonia vaccines. Call and schedule an eye exam. You have an order for:  []   2D Mammogram  [x]   3D Mammogram  [x]   Bone Density     Please call for appointment:  [facility]  [address] [place] [address] [phone]  Managing Pain Without Opioids Opioids are strong medicines used to treat moderate to severe pain. For some people, especially those who have long-term (chronic) pain, opioids may not be the best choice for pain management due to: Side effects like nausea, constipation, and sleepiness. The risk of addiction (opioid use disorder). The longer you take opioids, the greater your risk of addiction. Pain that lasts for more than 3 months is called chronic pain. Managing chronic pain usually requires more than one approach and is often provided by a team of health care providers working together (multidisciplinary approach). Pain management may be done at a pain management center or pain clinic. How to manage pain without the use of opioids Use non-opioid medicines Non-opioid medicines for pain may include: Over-the-counter or prescription non-steroidal anti-inflammatory drugs (NSAIDs). These may be the first medicines used for pain. They work well for muscle and bone pain, and they reduce swelling. Acetaminophen. This over-the-counter medicine may work well for milder pain but not swelling. Antidepressants. These may be used to treat chronic pain. A certain type of antidepressant (tricyclics) is often used. These medicines are  given in lower doses for pain than when used for depression. Anticonvulsants. These are usually used to treat seizures but may also reduce nerve (neuropathic) pain. Muscle relaxants. These relieve pain caused by sudden muscle tightening (spasms). You may also use a pain medicine that is applied to the skin as a patch, cream, or gel (topical analgesic), such as a numbing medicine. These may cause fewer side effects than medicines taken by mouth. Do certain therapies as directed Some therapies can help with pain management. They include: Physical therapy. You will do exercises to gain strength and flexibility. A physical therapist may teach you exercises to move and stretch parts of your body that are weak, stiff, or painful. You can learn these exercises at physical therapy visits and practice them at home. Physical therapy may also involve: Massage. Heat wraps or applying heat or cold to affected areas. Electrical signals that interrupt pain signals (transcutaneous electrical nerve stimulation, TENS). Weak lasers that reduce pain and swelling (low-level laser therapy). Signals from your body that help you learn to regulate pain (biofeedback). Occupational therapy. This helps you to learn ways to function at home and work with less pain. Recreational therapy. This involves trying new activities or hobbies, such as a physical activity or drawing. Mental health therapy, including: Cognitive behavioral therapy (CBT). This helps you learn coping skills for dealing with pain. Acceptance and commitment therapy (ACT) to change the way you think and react to pain. Relaxation therapies, including muscle relaxation exercises and mindfulness-based stress reduction. Pain management counseling. This may be individual, family, or group counseling.  Receive medical treatments Medical treatments for pain management include: Nerve block injections. These may include a pain blocker  and anti-inflammatory  medicines. You may have injections: Near the spine to relieve chronic back or neck pain. Into joints to relieve back or joint pain. Into nerve areas that supply a painful area to relieve body pain. Into muscles (trigger point injections) to relieve some painful muscle conditions. A medical device placed near your spine to help block pain signals and relieve nerve pain or chronic back pain (spinal cord stimulation device). Acupuncture. Follow these instructions at home Medicines Take over-the-counter and prescription medicines only as told by your health care provider. If you are taking pain medicine, ask your health care providers about possible side effects to watch out for. Do not drive or use heavy machinery while taking prescription opioid pain medicine. Lifestyle  Do not use drugs or alcohol to reduce pain. If you drink alcohol, limit how much you have to: 0-1 drink a day for women who are not pregnant. 0-2 drinks a day for men. Know how much alcohol is in a drink. In the [place] one drink equals one 12 oz bottle of beer (355 mL), one 5 oz glass of wine (148 mL), or one 1 oz glass of hard liquor (44 mL). Do not use any products that contain nicotine or tobacco. These products include cigarettes, chewing tobacco, and vaping devices, such as e-cigarettes. If you need help quitting, ask your health care provider. Eat a healthy diet and maintain a healthy weight. Poor diet and excess weight may make pain worse. Eat foods that are high in fiber. These include fresh fruits and vegetables, whole grains, and beans. Limit foods that are high in fat and processed sugars, such as fried and sweet foods. Exercise regularly. Exercise lowers stress and may help relieve pain. Ask your health care provider what activities and exercises are safe for you. If your health care provider approves, join an exercise class that combines movement and stress reduction. Examples include yoga and tai chi. Get enough  sleep. Lack of sleep may make pain worse. Lower stress as much as possible. Practice stress reduction techniques as told by your therapist. General instructions Work with all your pain management providers to find the treatments that work best for you. You are an important member of your pain management team. There are many things you can do to reduce pain on your own. Consider joining an online or in-person support group for people who have chronic pain. Keep all follow-up visits. This is important. Where to find more information You can find more information about managing pain without opioids from: American Academy of Pain Medicine: painmed.org Institute for Chronic Pain: instituteforchronicpain.org American Chronic Pain Association: theacpa.org Contact a health care provider if: You have side effects from pain medicine. Your pain gets worse or does not get better with treatments or home therapy. You are struggling with anxiety or depression. Summary Many types of pain can be managed without opioids. Chronic pain may respond better to pain management without opioids. Pain is best managed when you and a team of health care providers work together. Pain management without opioids may include non-opioid medicines, medical treatments, physical therapy, mental health therapy, and lifestyle changes. Tell your health care providers if your pain gets worse or is not being managed well enough. This information is not intended to replace advice given to you by your health care provider. Make sure you discuss any questions you have with your health care provider. Document Revised: 11/02/2020 Document Reviewed: 11/02/2020  Elsevier Patient Education  2024 Elsevier Inc. Make sure to  wear two-piece clothing.  No lotions, powders, or deodorants the day of the appointment. Make sure to bring picture ID and insurance card.  Bring list of medications you are currently taking including any supplements.     This is a list of the screening recommended for you and due dates:  Health Maintenance  Topic Date Due   Zoster (Shingles) Vaccine (1 of 2) Never done   DTaP/Tdap/Td vaccine (2 - Td or Tdap) 10/10/2022   Pneumonia Vaccine (3 of 3 - PPSV23 or PCV20) 06/11/2023   Flu Shot  11/04/2023*   Mammogram  07/23/2024*   DEXA scan (bone density measurement)  07/23/2024*   Eye exam for diabetics  08/05/2024*   Yearly kidney health urinalysis for diabetes  11/23/2023   Hemoglobin A1C  03/22/2024   Yearly kidney function blood test for diabetes  09/22/2024   Complete foot exam   09/22/2024   Medicare Annual Wellness Visit  10/17/2024   Colon Cancer Screening  05/25/2026   Hepatitis C Screening  Completed   HPV Vaccine  Aged Out   COVID-19 Vaccine  Discontinued   HIV Screening  Discontinued  *Topic was postponed. The date shown is not the original due date.    Advanced directives: (Declined) Advance directive discussed with you today. Even though you declined this today, please call our office should you change your mind, and we can give you the proper paperwork for you to fill out.  Next Medicare Annual Wellness Visit scheduled for next year: Yes 10/20/24 @ 1:40

## 2023-10-30 ENCOUNTER — Other Ambulatory Visit: Payer: Self-pay | Admitting: Internal Medicine

## 2023-11-18 ENCOUNTER — Other Ambulatory Visit: Payer: Self-pay | Admitting: Internal Medicine

## 2023-11-18 ENCOUNTER — Telehealth: Payer: Self-pay

## 2023-11-18 DIAGNOSIS — E785 Hyperlipidemia, unspecified: Secondary | ICD-10-CM

## 2023-11-18 DIAGNOSIS — E1140 Type 2 diabetes mellitus with diabetic neuropathy, unspecified: Secondary | ICD-10-CM

## 2023-11-18 NOTE — Telephone Encounter (Signed)
 Copied from CRM [id number]. Topic: Clinical - Medication Refill >> Nov 18, 2023  2:14 PM Howard Macho wrote: Most Recent Primary Care Visit:  Provider: Felicitas Horse C  Department: [facility]  Visit Type: MEDICARE AWV, SEQUENTIAL  Date: 10/18/2023  Medication: diazepam (VALIUM) 10 MG tablet  Has the patient contacted their pharmacy? Yes (Agent: If no, request that the patient contact the pharmacy for the refill. If patient does not wish to contact the pharmacy document the reason why and proceed with request.) (Agent: If yes, when and what did the pharmacy advise?)  Is this the correct pharmacy for this prescription? Yes If no, delete pharmacy and type the correct one.  This is the patient's preferred pharmacy:  [facility] - [place], [place] - [address] [place] [address] Phone: [phone] Fax: [phone]   Has the prescription been filled recently? No  Is the patient out of the medication? Yes  Has the patient been seen for an appointment in the last year OR does the patient have an upcoming appointment? Yes  Can we respond through MyChart? Yes  Agent: Please be advised that Rx refills may take up to 3 business days. We ask that you follow-up with your pharmacy.

## 2023-11-18 NOTE — Telephone Encounter (Signed)
 Patient is calling in regarding her medication she would like a call back regarding it

## 2023-11-18 NOTE — Telephone Encounter (Signed)
 Copied from CRM [id number]. Topic: Appointments - Appointment Cancel/Reschedule >> Nov 18, 2023  2:15 PM Howard Macho wrote: Patient/patient representative is calling to cancel or reschedule an appointment. Refer to attachments for appointment information.   Patient called stating she would like to get there A1C lab done in may because it has to be every three months but there are no labs in

## 2023-11-18 NOTE — Telephone Encounter (Signed)
 I have pended labs for your approval.

## 2023-11-18 NOTE — Addendum Note (Signed)
 Addended by: Thersia Flax on: 11/18/2023 03:32 PM   Modules accepted: Orders

## 2023-11-18 NOTE — Addendum Note (Signed)
 Addended by: Sacha Radloff on: 11/18/2023 03:10 PM   Modules accepted: Orders

## 2023-11-18 NOTE — Telephone Encounter (Signed)
 Spoke with pt and scheduled her for both a lab appt and a 3 month follow up to discuss the lab results.

## 2023-11-28 ENCOUNTER — Other Ambulatory Visit: Payer: Self-pay | Admitting: Internal Medicine

## 2023-12-23 ENCOUNTER — Other Ambulatory Visit

## 2023-12-23 DIAGNOSIS — E1140 Type 2 diabetes mellitus with diabetic neuropathy, unspecified: Secondary | ICD-10-CM

## 2023-12-23 DIAGNOSIS — E785 Hyperlipidemia, unspecified: Secondary | ICD-10-CM | POA: Diagnosis not present

## 2023-12-23 LAB — LIPID PANEL
Cholesterol: 166 mg/dL (ref 0–200)
HDL: 95.7 mg/dL (ref >39.00)
LDL Cholesterol: 50 mg/dL (ref 0–99)
NonHDL: 70.02
Total CHOL/HDL Ratio: 2
Triglycerides: 99 mg/dL (ref 0.0–149.0)
VLDL: 19.8 mg/dL (ref 0.0–40.0)

## 2023-12-23 LAB — MICROALBUMIN / CREATININE URINE RATIO
Creatinine,U: 12 mg/dL
Microalb Creat Ratio: UNDETERMINED mg/g (ref 0.0–30.0)
Microalb, Ur: <0.7 mg/dL

## 2023-12-23 LAB — COMPREHENSIVE METABOLIC PANEL WITH GFR
ALT: 22 U/L (ref 0–35)
AST: 25 U/L (ref 0–37)
Albumin: 4.6 g/dL (ref 3.5–5.2)
Alkaline Phosphatase: 187 U/L — ABNORMAL HIGH (ref 39–117)
BUN: 21 mg/dL (ref 6–23)
CO2: 34 meq/L — ABNORMAL HIGH (ref 19–32)
Calcium: 9.4 mg/dL (ref 8.4–10.5)
Chloride: 94 meq/L — ABNORMAL LOW (ref 96–112)
Creatinine, Ser: 0.87 mg/dL (ref 0.40–1.20)
GFR: 69.86 mL/min (ref >60.00)
Glucose, Bld: 96 mg/dL (ref 70–99)
Potassium: 4.2 meq/L (ref 3.5–5.1)
Sodium: 137 meq/L (ref 135–145)
Total Bilirubin: 0.9 mg/dL (ref 0.2–1.2)
Total Protein: 7.7 g/dL (ref 6.0–8.3)

## 2023-12-23 LAB — HEMOGLOBIN A1C: Hgb A1c MFr Bld: 6.2 % (ref 4.6–6.5)

## 2023-12-23 LAB — LDL CHOLESTEROL, DIRECT: Direct LDL: 43 mg/dL

## 2023-12-25 ENCOUNTER — Ambulatory Visit: Admitting: Internal Medicine

## 2023-12-25 ENCOUNTER — Encounter: Payer: Self-pay | Admitting: Internal Medicine

## 2023-12-25 ENCOUNTER — Telehealth: Admitting: Internal Medicine

## 2023-12-25 VITALS — Ht 60.0 in | Wt 132.0 lb

## 2023-12-25 DIAGNOSIS — E1140 Type 2 diabetes mellitus with diabetic neuropathy, unspecified: Secondary | ICD-10-CM | POA: Diagnosis not present

## 2023-12-25 DIAGNOSIS — E0590 Thyrotoxicosis, unspecified without thyrotoxic crisis or storm: Secondary | ICD-10-CM | POA: Diagnosis not present

## 2023-12-25 DIAGNOSIS — F329 Major depressive disorder, single episode, unspecified: Secondary | ICD-10-CM | POA: Diagnosis not present

## 2023-12-25 DIAGNOSIS — F411 Generalized anxiety disorder: Secondary | ICD-10-CM

## 2023-12-25 MED ORDER — DULOXETINE HCL 60 MG PO CPEP: 60.0000 mg | ORAL_CAPSULE | Freq: Every day | ORAL | 11 refills | Status: DC

## 2023-12-25 MED ORDER — DIAZEPAM 5 MG PO TABS: 5.0000 mg | ORAL_TABLET | Freq: Every evening | ORAL | 2 refills | Status: DC | PRN

## 2023-12-25 NOTE — Progress Notes (Unsigned)
 Virtual Visit via Caregility   Note   This format is felt to be most appropriate for this patient at this time.  All issues noted in this document were discussed and addressed.  No physical exam was performed (except for noted visual exam findings with Video Visits).   I connected withNAME@ on 12/25/23 at  1:00 PM EDT by a video enabled telemedicine application or telephone and verified that I am speaking with the correct person using two identifiers. Location patient: home Location provider: work or home office Persons participating in the virtual visit: patient, provider  I discussed the limitations, risks, security and privacy concerns of performing an evaluation and management service by telephone and the availability of in person appointments. I also discussed with the patient that there may be a patient responsible charge related to this service. The patient expressed understanding and agreed to proceed.  Interactive audio and video telecommunications were attempted between this provider and patient, however failed, due to patient having technical difficulties OR patient did not have access to video capability.  We continued and completed visit with audio only. ***  Reason for visit: follow, up on type 2 DM , chronic pain.  Anxiety/depression  HPI:  1) type 2 DM A1c 6.2 .  Has regained 30 lbs since stopping ozempic . DEXA scheduled in June (first one)  2) anxiety : worse at night.  Using 5 mg valium  to sleep.  Tried relaxium.   No prior trial of trazodone.   Current cymbalta  dose 30 mg due to concurrent use of tramadol .   3) depression: taking wellbutrin .  Recent beach trip with 2 sisters very relaxing and rewarding.   4) Graves;  methimazole dose reduced in March to 5 mg every other day       ROS: See pertinent positives and negatives per HPI.  Past Medical History:  Diagnosis Date   Allergy    Arthritis    Cancer (HCC)    MULTIPLE SQUAMOUS CELL-WRIST, BUTTOCKS, ABDOMEN,  LIP   Chicken pox    Chronic post-traumatic stress disorder (PTSD) 09/05/2017   Complication of anesthesia    PT GETS VERY ANXIOUS PRIOR TO ANESTHESIA AND WILL START JERKING MASK OFF   Depression    Diabetes mellitus without complication (HCC)    Endometriosis    Headache    Hyperlipidemia associated with type 2 diabetes mellitus (HCC)    Lower extremity edema    Tobacco abuse     Past Surgical History:  Procedure Laterality Date   ABDOMINAL HYSTERECTOMY     COLONOSCOPY WITH PROPOFOL  N/A 05/25/2021   Procedure: COLONOSCOPY WITH PROPOFOL ;  Surgeon: Marnee Sink, MD;  Location: [facility];  Service: Endoscopy;  Laterality: N/A;   CYSTOSCOPY     X3   DIAGNOSTIC LAPAROSCOPY     ESOPHAGOGASTRODUODENOSCOPY (EGD) WITH PROPOFOL  N/A 05/25/2021   Procedure: ESOPHAGOGASTRODUODENOSCOPY (EGD) WITH PROPOFOL ;  Surgeon: Marnee Sink, MD;  Location: [facility];  Service: Endoscopy;  Laterality: N/A;   MOHS SURGERY     PAROTIDECTOMY Left 06/20/2015   Procedure: PAROTIDECTOMY;  Surgeon: Mellody Sprout, MD;  Location: [facility] ORS;  Service: ENT;  Laterality: Left;    Family History  Problem Relation Age of Onset   Arthritis Mother    Hypertension Mother    Pancreatic cancer Maternal Aunt    Bone cancer Maternal Aunt    Diabetes Mellitus I Maternal Uncle        CAD    Vasculitis Maternal Aunt  Wegener's     SOCIAL HX: ***   Current Outpatient Medications:    atorvastatin  (LIPITOR) 40 MG tablet, TAKE ONE TABLET BY MOUTH ONCE DAILY, Disp: 90 tablet, Rfl: 1   blood glucose meter kit and supplies KIT, Dispense based on patient and insurance preference. Use up to four times daily as directed. (Patient taking differently: Inject 1 each into the skin as directed. Dispense based on patient and insurance preference. Use up to four times daily as directed.), Disp: 1 each, Rfl: 11   buPROPion  (WELLBUTRIN  XL) 150 MG 24 hr tablet, Take 2 tablets (300 mg total) by mouth daily., Disp: 180 tablet,  Rfl: 1   diazepam  (VALIUM ) 10 MG tablet, TAKE ONE TABLET BY MOUTH AT BEDTIME AS NEEDED FOR ANXIETY, Disp: 30 tablet, Rfl: 1   DULoxetine  (CYMBALTA ) 30 MG capsule, TAKE 1 TABLET BY MOUTH ONCE DAILY. (TO BE TAKEN WITH 60MG  TABLET) `, Disp: 90 capsule, Rfl: 2   furosemide  (LASIX ) 20 MG tablet, Take 1 tablet (20 mg total) by mouth daily., Disp: 90 tablet, Rfl: 3   methimazole (TAPAZOLE) 5 MG tablet, Take 5 mg by mouth daily., Disp: , Rfl:    ondansetron  (ZOFRAN -ODT) 4 MG disintegrating tablet, Take 1 tablet (4 mg total) by mouth every 8 (eight) hours as needed for nausea or vomiting., Disp: 45 tablet, Rfl: 11   promethazine  (PHENERGAN ) 12.5 MG tablet, TAKE 1 TABLET AT BEDTIME AS NEEDED FOR NAUSEA & VOMITING, Disp: 20 tablet, Rfl: 5   spironolactone  (ALDACTONE ) 50 MG tablet, TAKE ONE TABLET BY MOUTH ONCE DAILY, Disp: 90 tablet, Rfl: 1   traMADol  (ULTRAM ) 50 MG tablet, TAKE TWO TABLETS BY MOUTH EVERY 6 HOURS AS NEEDED FOR PAIN, Disp: 240 tablet, Rfl: 2  EXAM:  VITALS per patient if applicable:  GENERAL: alert, oriented, appears well and in no acute distress  HEENT: atraumatic, conjunttiva clear, no obvious abnormalities on inspection of external nose and ears  NECK: normal movements of the head and neck  LUNGS: on inspection no signs of respiratory distress, breathing rate appears normal, no obvious gross SOB, gasping or wheezing  CV: no obvious cyanosis  MS: moves all visible extremities without noticeable abnormality  PSYCH/NEURO: pleasant and cooperative, no obvious depression or anxiety, speech and thought processing grossly intact  ASSESSMENT AND PLAN: There are no diagnoses linked to this encounter.    I discussed the assessment and treatment plan with the patient. The patient was provided an opportunity to ask questions and all were answered. The patient agreed with the plan and demonstrated an understanding of the instructions.   The patient was advised to call back or seek an  in-person evaluation if the symptoms worsen or if the condition fails to improve as anticipated.   I spent 30 minutes dedicated to the care of this patient on the date of this encounter to include pre-visit review of patient's medical history,  including recent ER visit, imaging studies and labs, face-to-face time with the patient , and post visit ordering of testing and therapeutics.    Thersia Flax, MD

## 2023-12-29 ENCOUNTER — Other Ambulatory Visit: Payer: Self-pay | Admitting: Internal Medicine

## 2023-12-29 DIAGNOSIS — Z760 Encounter for issue of repeat prescription: Secondary | ICD-10-CM

## 2023-12-30 NOTE — Assessment & Plan Note (Signed)
 Etiology  considered to be Grave's  Disease based on review of Endicrinology notes from jan 2025 forward. .  Methimazole dose was recently reduced to every other day

## 2023-12-30 NOTE — Assessment & Plan Note (Signed)
 Well controlled currently on diet alone .  Her neuropathy is managed with gabapentin .   ozempic  was stopped in November after excessive weight loss since initiation in  April 2023 after thyroid  ultrasound was repeated  to follow up on thyroid  nodules.  Lipase was also noted to be elevated, making continued use contraindicated .  She has a metformin  intolerance, and Invokanna aggravated her urinary incontinence.  .  Continue statin .  No hypoglycemics  are needed at this time   Lab Results  Component Value Date   HGBA1C 6.2 12/23/2023   Lab Results  Component Value Date   MICROALBUR <0.7 12/23/2023   MICROALBUR 1.0 11/23/2022    Lab Results  Component Value Date   CREATININE 0.87 12/23/2023

## 2023-12-30 NOTE — Assessment & Plan Note (Signed)
 Not sleeping without valium .  Refills given 5 mg dose.  Advised to increase cymbalta  dose to 60 mg daily

## 2023-12-30 NOTE — Assessment & Plan Note (Signed)
 Mood improved on wellbutrin  300 mg daily .  No changes today

## 2023-12-31 NOTE — Telephone Encounter (Signed)
 Refilled: 10/02/2023 Last OV: 12/25/2023 Next OV: not scheduled

## 2024-01-02 ENCOUNTER — Ambulatory Visit
Admission: RE | Admit: 2024-01-02 | Discharge: 2024-01-02 | Disposition: A | Discharge status: Home / Self Care | Source: Ambulatory Visit | Attending: Internal Medicine | Admitting: Internal Medicine

## 2024-01-02 DIAGNOSIS — Z1231 Encounter for screening mammogram for malignant neoplasm of breast: Secondary | ICD-10-CM | POA: Insufficient documentation

## 2024-01-02 DIAGNOSIS — M810 Age-related osteoporosis without current pathological fracture: Secondary | ICD-10-CM | POA: Diagnosis not present

## 2024-01-02 DIAGNOSIS — Z780 Asymptomatic menopausal state: Secondary | ICD-10-CM | POA: Diagnosis not present

## 2024-01-02 DIAGNOSIS — E0500 Thyrotoxicosis with diffuse goiter without thyrotoxic crisis or storm: Secondary | ICD-10-CM | POA: Diagnosis not present

## 2024-01-06 ENCOUNTER — Ambulatory Visit: Payer: Self-pay | Admitting: Internal Medicine

## 2024-01-09 NOTE — Telephone Encounter (Signed)
 Patient is aware to take 1200 mg of calcium  and 1000 international units  of vitamin d3 and is scheduled for a virtual visit on 02/05/24.

## 2024-01-12 ENCOUNTER — Other Ambulatory Visit: Payer: Self-pay | Admitting: Internal Medicine

## 2024-01-17 ENCOUNTER — Telehealth: Payer: Self-pay

## 2024-01-17 DIAGNOSIS — E785 Hyperlipidemia, unspecified: Secondary | ICD-10-CM

## 2024-01-17 DIAGNOSIS — E1140 Type 2 diabetes mellitus with diabetic neuropathy, unspecified: Secondary | ICD-10-CM

## 2024-01-17 NOTE — Telephone Encounter (Signed)
 noted

## 2024-01-17 NOTE — Addendum Note (Signed)
 Addended by: Jari Dipasquale on: 01/17/2024 03:02 PM   Modules accepted: Orders

## 2024-01-17 NOTE — Telephone Encounter (Signed)
 Orders have been placed. Please schedule pt for a fating lab appt 3 months and 1 day after 12/23/2023.

## 2024-01-17 NOTE — Telephone Encounter (Signed)
 Copied from CRM [id number]. Topic: Clinical - Request for Lab/Test Order >> Jan 17, 2024 12:21 PM Melissa C wrote: Reason for CRM: patient had A1C labs done on 5/19 and she would like to have a 3 month repeat test done. Please advise and contact patient for scheduling once orders are in place.

## 2024-01-19 ENCOUNTER — Encounter: Payer: Self-pay | Admitting: Internal Medicine

## 2024-01-19 DIAGNOSIS — M810 Age-related osteoporosis without current pathological fracture: Secondary | ICD-10-CM | POA: Insufficient documentation

## 2024-02-05 ENCOUNTER — Ambulatory Visit: Admitting: Internal Medicine

## 2024-02-06 DIAGNOSIS — E041 Nontoxic single thyroid nodule: Secondary | ICD-10-CM | POA: Diagnosis not present

## 2024-02-06 DIAGNOSIS — E119 Type 2 diabetes mellitus without complications: Secondary | ICD-10-CM | POA: Diagnosis not present

## 2024-02-06 DIAGNOSIS — E0500 Thyrotoxicosis with diffuse goiter without thyrotoxic crisis or storm: Secondary | ICD-10-CM | POA: Diagnosis not present

## 2024-02-19 ENCOUNTER — Ambulatory Visit: Admitting: Internal Medicine

## 2024-02-19 ENCOUNTER — Encounter: Payer: Self-pay | Admitting: Internal Medicine

## 2024-02-19 VITALS — BP 142/84 | HR 88 | Temp 98.1°F | Ht 60.0 in | Wt 139.2 lb

## 2024-02-19 DIAGNOSIS — Z91041 Radiographic dye allergy status: Secondary | ICD-10-CM | POA: Insufficient documentation

## 2024-02-19 DIAGNOSIS — R221 Localized swelling, mass and lump, neck: Secondary | ICD-10-CM | POA: Diagnosis not present

## 2024-02-19 MED ORDER — PREDNISONE 50 MG PO TABS: ORAL_TABLET | ORAL | 0 refills | Status: DC

## 2024-02-19 NOTE — Addendum Note (Signed)
 Addended by: SEBASTIAN NORRIS A on: 02/19/2024 04:59 PM   Modules accepted: Orders

## 2024-02-19 NOTE — Assessment & Plan Note (Signed)
-   Patient has history of contrast media allergy -Patient states that she had a syncopal event after having contrast but has been premedicated with prednisone  for her last CT and had no adverse events -Will premedicate the patient with prednisone  50 mg for 3 doses (13 hours, 7 hours and 1 hour before her scheduled CT) -No further workup at this time

## 2024-02-19 NOTE — Assessment & Plan Note (Signed)
-   Patient noted a small nodule on the right side of neck anterior to her sternocleidomastoid muscle with some mild tenderness to palpation which has been unchanged in size over the last 3 weeks -On exam, small mobile nodule anterior to the sternocleidomastoid muscle on the right side of her neck was palpated approximately 1 cm in diameter with no overlying erythema.  It did not move with swallowing -Given that this has been persistent over the last 3 weeks in the absence of any sign of infection we will image it to further evaluate it -CT neck with contrast was ordered  - Patient has an allergy to contrast but has been premedicated before with no adverse events noted.  Will premedicate her with prednisone  50 mg x 3 doses (13 hours, 7 hours and 1 hour before CT) -Patient did have a thyroid  ultrasound done in January which showed no thyroid  nodule. -No further workup at this time

## 2024-02-19 NOTE — Patient Instructions (Addendum)
-   It was a pleasure meeting you today -I am uncertain what the nodule on the right side of your neck is currently.  It will need further imaging.  I have put in for a CT of your neck for you.  This will involve contrast. -I have called in prednisone  50 mg tablets for you.  Take 1 tablet 13 hours, 7 hours and 1 hour before your CT scan is scheduled -Please contact us  with any questions or concerns or if you need any refills

## 2024-02-19 NOTE — Progress Notes (Signed)
 Acute Office Visit  Subjective:     Patient ID: Kristie Jones, female    DOB: 1956-06-08, 68 y.o.   MRN: [id number]  Chief Complaint  Patient presents with   Acute Visit    Lump on side of neck    HPI Patient is in today for nodule on the right side of her neck.  Patient states that approximately 2 weeks ago she noted a small nodule on the right side of her neck near her thyroid  that is approximately the size of a quarter.  Patient states that she does have some mild tenderness when she presses on it but denies pain otherwise.  No associated fevers or chills.  No weight loss.  No recent URI symptoms.  Patient states that is remain unchanged in size since she noticed it.  Review of Systems  Constitutional: Negative.  Negative for fever and weight loss.  HENT:         Complains of lump on right side of her neck  Respiratory: Negative.    Cardiovascular: Negative.   Gastrointestinal: Negative.   Musculoskeletal: Negative.   Neurological: Negative.   Psychiatric/Behavioral:  The patient is nervous/anxious.         Objective:    BP (!) 142/84   Pulse 88   Temp 98.1 F (36.7 C)   Ht 5' (1.524 m)   Wt 139 lb 3.2 oz (63.1 kg)   SpO2 97%   BMI 27.19 kg/m    Physical Exam Constitutional:      Appearance: Normal appearance.  HENT:     Head: Normocephalic and atraumatic.  Neck:     Comments: Small, mobile nodule noted on the right side of her neck approximately 1 cm in diameter anterior to her for sternocleidomastoid muscle.  Nontender to palpation.  No overlying erythema. Cardiovascular:     Rate and Rhythm: Normal rate and regular rhythm.     Heart sounds: Normal heart sounds.  Pulmonary:     Effort: No respiratory distress.     Breath sounds: Normal breath sounds. No wheezing or rales.  Musculoskeletal:     Cervical back: Neck supple.  Neurological:     Mental Status: She is alert.     No results found for any visits on 02/19/24.      Assessment & Plan:    Problem List Items Addressed This Visit       Other   Contrast media allergy   - Patient has history of contrast media allergy -Patient states that she had a syncopal event after having contrast but has been premedicated with prednisone  for her last CT and had no adverse events -Will premedicate the patient with prednisone  50 mg for 3 doses (13 hours, 7 hours and 1 hour before her scheduled CT) -No further workup at this time      Relevant Medications   predniSONE  (DELTASONE ) 50 MG tablet   Mass of right side of neck - Primary   - Patient noted a small nodule on the right side of neck anterior to her sternocleidomastoid muscle with some mild tenderness to palpation which has been unchanged in size over the last 3 weeks -On exam, small mobile nodule anterior to the sternocleidomastoid muscle on the right side of her neck was palpated approximately 1 cm in diameter with no overlying erythema.  It did not move with swallowing -Given that this has been persistent over the last 3 weeks in the absence of any sign of infection we will  image it to further evaluate it -CT neck with contrast was ordered  - Patient has an allergy to contrast but has been premedicated before with no adverse events noted.  Will premedicate her with prednisone  50 mg x 3 doses (13 hours, 7 hours and 1 hour before CT) -Patient did have a thyroid  ultrasound done in January which showed no thyroid  nodule. -No further workup at this time      Relevant Orders   CT Soft Tissue Neck W Contrast    Meds ordered this encounter  Medications   predniSONE  (DELTASONE ) 50 MG tablet    Sig: Take 1 tablet 13 hours, 7 hours and 1 hour before your CT scan    Dispense:  3 tablet    Refill:  0    No follow-ups on file.  Tekesha Almgren, MD

## 2024-02-21 ENCOUNTER — Ambulatory Visit: Payer: Self-pay | Admitting: Internal Medicine

## 2024-02-21 ENCOUNTER — Ambulatory Visit
Admission: RE | Admit: 2024-02-21 | Discharge: 2024-02-21 | Disposition: A | Discharge status: Home / Self Care | Source: Ambulatory Visit | Attending: Internal Medicine | Admitting: Internal Medicine

## 2024-02-21 DIAGNOSIS — I6523 Occlusion and stenosis of bilateral carotid arteries: Secondary | ICD-10-CM | POA: Diagnosis not present

## 2024-02-21 DIAGNOSIS — R221 Localized swelling, mass and lump, neck: Secondary | ICD-10-CM | POA: Insufficient documentation

## 2024-02-21 DIAGNOSIS — I700 Atherosclerosis of aorta: Secondary | ICD-10-CM | POA: Diagnosis not present

## 2024-02-21 LAB — POCT I-STAT CREATININE: Creatinine, Ser: 1 mg/dL (ref 0.44–1.00)

## 2024-02-21 MED ORDER — IOHEXOL 300 MG/ML  SOLN
75.0000 mL | Freq: Once | INTRAMUSCULAR | Status: AC | PRN
Start: 2024-02-21 — End: 2024-02-21
  Administered 2024-02-21: 75 mL via INTRAVENOUS

## 2024-02-21 NOTE — Addendum Note (Signed)
 Addended by: SEBASTIAN NORRIS A on: 02/21/2024 08:14 AM   Modules accepted: Orders

## 2024-02-24 ENCOUNTER — Telehealth: Payer: Self-pay | Admitting: Internal Medicine

## 2024-02-24 ENCOUNTER — Other Ambulatory Visit: Payer: Self-pay | Admitting: Internal Medicine

## 2024-02-24 DIAGNOSIS — R221 Localized swelling, mass and lump, neck: Secondary | ICD-10-CM

## 2024-02-24 NOTE — Telephone Encounter (Signed)
 Called Patient and she was upset and wanted to speak to the provider regarding her imaging so I transferred the call to Dr. Onesimo.

## 2024-02-24 NOTE — Telephone Encounter (Signed)
 Patient called today to go over the results of her CT scan.  Patient remains concerned about the lump over the right side of her neck.  I explained to the patient that on the CT scan there were no findings that were concerning.  She did have some cervical lymph nodes that were 6 to 7 mm large but these were not concerning and this may be explanation for the lump that she is feeling.  However, patient remains concerned about this and would like further evaluation.  I have put in referral for ENT  (Dr.Juengel) follow-up for this but I suspect this is likely benign etiology.  Patient also was concerned about the atherosclerosis that was noted on the CT scan.  I explained to her that as we age we can get cholesterol plaques in our arteries.  Nothing urgent to do for this but patient should follow-up with Dr. [name] for further management including cholesterol management as well as possible aspirin initiation.  Patient expressed understanding and was thankful for the call.  Will follow-up ENT recommendations.

## 2024-03-03 ENCOUNTER — Other Ambulatory Visit: Payer: Self-pay | Admitting: Internal Medicine

## 2024-03-03 NOTE — Telephone Encounter (Signed)
 Refilled: 12/25/2023 Last OV: 02/19/2024 Next OV: not scheduled

## 2024-03-17 DIAGNOSIS — K1123 Chronic sialoadenitis: Secondary | ICD-10-CM | POA: Diagnosis not present

## 2024-03-19 ENCOUNTER — Other Ambulatory Visit (INDEPENDENT_AMBULATORY_CARE_PROVIDER_SITE_OTHER)

## 2024-03-19 DIAGNOSIS — E1140 Type 2 diabetes mellitus with diabetic neuropathy, unspecified: Secondary | ICD-10-CM | POA: Diagnosis not present

## 2024-03-19 DIAGNOSIS — E785 Hyperlipidemia, unspecified: Secondary | ICD-10-CM

## 2024-03-19 LAB — COMPREHENSIVE METABOLIC PANEL WITH GFR
ALT: 23 U/L (ref 0–35)
AST: 24 U/L (ref 0–37)
Albumin: 4.1 g/dL (ref 3.5–5.2)
Alkaline Phosphatase: 150 U/L — ABNORMAL HIGH (ref 39–117)
BUN: 16 mg/dL (ref 6–23)
CO2: 31 meq/L (ref 19–32)
Calcium: 8.9 mg/dL (ref 8.4–10.5)
Chloride: 99 meq/L (ref 96–112)
Creatinine, Ser: 0.79 mg/dL (ref 0.40–1.20)
GFR: 78.3 mL/min (ref >60.00)
Glucose, Bld: 129 mg/dL — ABNORMAL HIGH (ref 70–99)
Potassium: 4.6 meq/L (ref 3.5–5.1)
Sodium: 137 meq/L (ref 135–145)
Total Bilirubin: 0.7 mg/dL (ref 0.2–1.2)
Total Protein: 6.5 g/dL (ref 6.0–8.3)

## 2024-03-19 LAB — LIPID PANEL
Cholesterol: 166 mg/dL (ref 0–200)
HDL: 97.2 mg/dL (ref >39.00)
LDL Cholesterol: 55 mg/dL (ref 0–99)
NonHDL: 68.7
Total CHOL/HDL Ratio: 2
Triglycerides: 70 mg/dL (ref 0.0–149.0)
VLDL: 14 mg/dL (ref 0.0–40.0)

## 2024-03-19 LAB — LDL CHOLESTEROL, DIRECT: Direct LDL: 52 mg/dL

## 2024-03-19 LAB — HEMOGLOBIN A1C: Hgb A1c MFr Bld: 6.3 % (ref 4.6–6.5)

## 2024-03-21 ENCOUNTER — Ambulatory Visit: Payer: Self-pay | Admitting: Internal Medicine

## 2024-03-29 ENCOUNTER — Other Ambulatory Visit: Payer: Self-pay | Admitting: Internal Medicine

## 2024-03-29 DIAGNOSIS — Z760 Encounter for issue of repeat prescription: Secondary | ICD-10-CM

## 2024-03-30 NOTE — Telephone Encounter (Signed)
 Refilled: 12/31/2023 Last OV: 02/19/2024 Next OV: 09/25/2024

## 2024-05-06 DIAGNOSIS — E0500 Thyrotoxicosis with diffuse goiter without thyrotoxic crisis or storm: Secondary | ICD-10-CM | POA: Diagnosis not present

## 2024-05-11 ENCOUNTER — Telehealth: Payer: Self-pay | Admitting: Internal Medicine

## 2024-05-11 DIAGNOSIS — F329 Major depressive disorder, single episode, unspecified: Secondary | ICD-10-CM

## 2024-05-11 NOTE — Telephone Encounter (Unsigned)
 Copied from CRM #[id number]. Topic: Clinical - Medication Refill >> May 11, 2024  9:53 AM Tiffini S wrote: Medication: buPROPion  (WELLBUTRIN  XL) 150 MG 24 hr tablet- dosage increased to 300mg  for 90 day supply   Has the patient contacted their pharmacy? Yes (Agent: If no, request that the patient contact the pharmacy for the refill. If patient does not wish to contact the pharmacy document the reason why and proceed with request.) (Agent: If yes, when and what did the pharmacy advise?)  This is the patient's preferred pharmacy:  [facility] - [place], [place] - [address] [place] [address] Phone: [phone] Fax: [phone]  Is this the correct pharmacy for this prescription? Yes If no, delete pharmacy and type the correct one.   Has the prescription been filled recently? Yes  Is the patient out of the medication? Yes  Has the patient been seen for an appointment in the last year OR does the patient have an upcoming appointment? Yes  Can we respond through MyChart? Yes  Agent: Please be advised that Rx refills may take up to 3 business days. We ask that you follow-up with your pharmacy.

## 2024-05-13 DIAGNOSIS — E119 Type 2 diabetes mellitus without complications: Secondary | ICD-10-CM | POA: Diagnosis not present

## 2024-05-13 DIAGNOSIS — R635 Abnormal weight gain: Secondary | ICD-10-CM | POA: Diagnosis not present

## 2024-05-13 DIAGNOSIS — E041 Nontoxic single thyroid nodule: Secondary | ICD-10-CM | POA: Diagnosis not present

## 2024-05-13 DIAGNOSIS — E0500 Thyrotoxicosis with diffuse goiter without thyrotoxic crisis or storm: Secondary | ICD-10-CM | POA: Diagnosis not present

## 2024-05-13 MED ORDER — BUPROPION HCL ER (XL) 150 MG PO TB24: 300.0000 mg | ORAL_TABLET | Freq: Every day | ORAL | 1 refills | Status: AC

## 2024-05-15 NOTE — Telephone Encounter (Unsigned)
 Copied from CRM [id number]. Topic: Clinical - Prescription Issue >> May 15, 2024  8:38 AM Deaijah H wrote: Reason for CRM: Patient called in to follow up on refill request for buPROPion  (WELLBUTRIN  XL) 150 MG 24 hr tablet advised approved and sent to pharmacy on 10/8 she stated as of yesterday pharmacy is claiming they do not have the prescription.   ----------------------------------------------------------------------- From previous Reason for Contact - Medication Question: Reason for CRM:

## 2024-05-15 NOTE — Telephone Encounter (Signed)
 Spoke with pharmacy & her medication is ready for pickup.  Pt has also been notified

## 2024-05-26 ENCOUNTER — Other Ambulatory Visit: Payer: Self-pay | Admitting: Internal Medicine

## 2024-05-27 ENCOUNTER — Other Ambulatory Visit: Payer: Self-pay | Admitting: Internal Medicine

## 2024-05-27 ENCOUNTER — Telehealth: Payer: Self-pay | Admitting: Internal Medicine

## 2024-05-27 MED ORDER — PROMETHAZINE HCL 12.5 MG PO TABS: 12.5000 mg | ORAL_TABLET | Freq: Three times a day (TID) | ORAL | 5 refills | Status: AC | PRN

## 2024-05-27 MED ORDER — DULOXETINE HCL 60 MG PO CPEP: 60.0000 mg | ORAL_CAPSULE | Freq: Every day | ORAL | 0 refills | Status: DC

## 2024-05-27 NOTE — Telephone Encounter (Signed)
 Copied from CRM #[id number]. Topic: Clinical - Medication Refill >> May 27, 2024  1:37 PM Nessti S wrote: Medication: DULoxetine  (CYMBALTA ) 60 MG capsule [[id number]]  Has the patient contacted their pharmacy? Yes (Agent: If no, request that the patient contact the pharmacy for the refill. If patient does not wish to contact the pharmacy document the reason why and proceed with request.) (Agent: If yes, when and what did the pharmacy advise?)  This is the patient's preferred pharmacy:  [facility] - [place], [place] - [address] [place] [address] Phone: [phone] Fax: [phone]  Is this the correct pharmacy for this prescription? Yes If no, delete pharmacy and type the correct one.   Has the prescription been filled recently? No  Is the patient out of the medication? Yes  Has the patient been seen for an appointment in the last year OR does the patient have an upcoming appointment? Yes  Can we respond through MyChart? Yes  Agent: Please be advised that Rx refills may take up to 3 business days. We ask that you follow-up with your pharmacy.

## 2024-05-27 NOTE — Telephone Encounter (Unsigned)
 Copied from CRM #[id number]. Topic: Clinical - Medication Refill >> May 27, 2024  1:37 PM Nessti S wrote: Medication: DULoxetine  (CYMBALTA ) 60 MG capsule [[id number]]  Has the patient contacted their pharmacy? Yes (Agent: If no, request that the patient contact the pharmacy for the refill. If patient does not wish to contact the pharmacy document the reason why and proceed with request.) (Agent: If yes, when and what did the pharmacy advise?)  This is the patient's preferred pharmacy:  [facility] - [place], [place] - [address] [place] [address] Phone: [phone] Fax: [phone]  Is this the correct pharmacy for this prescription? Yes If no, delete pharmacy and type the correct one.   Has the prescription been filled recently? No  Is the patient out of the medication? Yes  Has the patient been seen for an appointment in the last year OR does the patient have an upcoming appointment? Yes  Can we respond through MyChart? Yes  Agent: Please be advised that Rx refills may take up to 3 business days. We ask that you follow-up with your pharmacy.

## 2024-05-27 NOTE — Telephone Encounter (Signed)
 Refilled: 04/10/2023 Last OV: 12/25/2023 Next OV: 09/25/2024

## 2024-06-22 ENCOUNTER — Other Ambulatory Visit: Payer: Self-pay | Admitting: Internal Medicine

## 2024-06-24 NOTE — Telephone Encounter (Signed)
 open in error

## 2024-06-27 ENCOUNTER — Other Ambulatory Visit: Payer: Self-pay | Admitting: Internal Medicine

## 2024-06-27 DIAGNOSIS — Z760 Encounter for issue of repeat prescription: Secondary | ICD-10-CM

## 2024-07-09 ENCOUNTER — Other Ambulatory Visit: Payer: Self-pay | Admitting: Internal Medicine

## 2024-07-09 ENCOUNTER — Telehealth: Payer: Self-pay | Admitting: Internal Medicine

## 2024-07-09 DIAGNOSIS — Z760 Encounter for issue of repeat prescription: Secondary | ICD-10-CM

## 2024-07-09 MED ORDER — TRAMADOL HCL 50 MG PO TABS: ORAL_TABLET | ORAL | 0 refills | Status: DC

## 2024-07-09 NOTE — Telephone Encounter (Signed)
 Copied from CRM #[id number]. Topic: Clinical - Medication Refill >> Jul 09, 2024  9:24 AM Montie POUR wrote: Medication:  traMADol  (ULTRAM ) 50 MG tablet - She has an appointment on 07/17/24. If you cannot fill the 30 refill, she wants to see if she can get enough to do her until her appointment.   Has the patient contacted their pharmacy? Yes (Agent: If no, request that the patient contact the pharmacy for the refill. If patient does not wish to contact the pharmacy document the reason why and proceed with request.) (Agent: If yes, when and what did the pharmacy advise?)  This is the patient's preferred pharmacy:  [facility] - [place], [place] - [address] [place] [address] Phone: [phone] Fax: [phone]  Is this the correct pharmacy for this prescription? Yes If no, delete pharmacy and type the correct one.   Has the prescription been filled recently? No  Is the patient out of the medication? Yes - She ran out last night.   Has the patient been seen for an appointment in the last year OR does the patient have an upcoming appointment? Yes 07/17/24 at 5:00 PM Appointment  Can we respond through MyChart? No  Agent: Please be advised that Rx refills may take up to 3 business days. We ask that you follow-up with your pharmacy.

## 2024-07-09 NOTE — Telephone Encounter (Signed)
 Pt is aware that medication has been sent in.

## 2024-07-09 NOTE — Telephone Encounter (Signed)
 Pt called in and scheduled a follow up appt for 07/17/2024. Pt would like to know if she can get her Tramadol  refilled now since she scheduled the follow up appt.

## 2024-07-09 NOTE — Telephone Encounter (Signed)
 Patient is requesting a phone call to discuss the reasoning as to why her medication refill was denied by provider. I let her know that an appointment was required to get a refill and she is scheduled for 07/17/2024. She is wanting to pick up her medication today. She stated that she did what she was supposed to ,and made an appointment so she's not sure why her medication can't be filled.

## 2024-07-17 ENCOUNTER — Telehealth: Admitting: Internal Medicine

## 2024-07-17 ENCOUNTER — Encounter: Payer: Self-pay | Admitting: Internal Medicine

## 2024-07-17 VITALS — Ht 60.0 in | Wt 141.0 lb

## 2024-07-17 DIAGNOSIS — M545 Low back pain, unspecified: Secondary | ICD-10-CM | POA: Diagnosis not present

## 2024-07-17 DIAGNOSIS — E785 Hyperlipidemia, unspecified: Secondary | ICD-10-CM | POA: Diagnosis not present

## 2024-07-17 DIAGNOSIS — R221 Localized swelling, mass and lump, neck: Secondary | ICD-10-CM

## 2024-07-17 DIAGNOSIS — M546 Pain in thoracic spine: Secondary | ICD-10-CM

## 2024-07-17 DIAGNOSIS — J0110 Acute frontal sinusitis, unspecified: Secondary | ICD-10-CM

## 2024-07-17 DIAGNOSIS — M47812 Spondylosis without myelopathy or radiculopathy, cervical region: Secondary | ICD-10-CM

## 2024-07-17 DIAGNOSIS — E0590 Thyrotoxicosis, unspecified without thyrotoxic crisis or storm: Secondary | ICD-10-CM | POA: Diagnosis not present

## 2024-07-17 DIAGNOSIS — K760 Fatty (change of) liver, not elsewhere classified: Secondary | ICD-10-CM | POA: Diagnosis not present

## 2024-07-17 MED ORDER — AZITHROMYCIN 500 MG PO TABS: 500.0000 mg | ORAL_TABLET | Freq: Every day | ORAL | 0 refills | Status: AC

## 2024-07-17 MED ORDER — FUROSEMIDE 20 MG PO TABS: 20.0000 mg | ORAL_TABLET | Freq: Every day | ORAL | 3 refills | Status: AC

## 2024-07-17 MED ORDER — DULOXETINE HCL 60 MG PO CPEP: 60.0000 mg | ORAL_CAPSULE | Freq: Every day | ORAL | 1 refills | Status: AC

## 2024-07-17 MED ORDER — PREDNISONE 10 MG PO TABS: ORAL_TABLET | ORAL | 0 refills | Status: AC

## 2024-07-17 MED ORDER — ATORVASTATIN CALCIUM 40 MG PO TABS: 40.0000 mg | ORAL_TABLET | Freq: Every day | ORAL | 1 refills | Status: DC

## 2024-07-17 NOTE — Progress Notes (Unsigned)
 Virtual Visit via Caregility   Note   This format is felt to be most appropriate for this patient at this time.  All issues noted in this document were discussed and addressed.  No physical exam was performed (except for noted visual exam findings with Video Visits).   I connected with Kristie Jones  on 07/17/2024 at  5:00 PM EST by a video enabled telemedicine application or telephone and verified that I am speaking with the correct person using two identifiers. Location patient: home Location provider: work or home office Persons participating in the virtual visit: patient, provider  I discussed the limitations, risks, security and privacy concerns of performing an evaluation and management service by telephone and the availability of in person appointments. I also discussed with the patient that there may be a patient responsible charge related to this service. The patient expressed understanding and agreed to proceed.  Reason for visit: medication refill   HPI:  68 yr old female with type 2 DM  Grave's Disease ,  chronic thoracolumbar back pain and GAD/depression  presents for r6 month  follow up on chronic back pain managed with tramadol . last seen on May for virtual visit , courtesy refill given  Patient states that she feels the worst she has every felt in her life.  She has had flu like symptoms for over a week,  and endorses  profound fatigue  sinus pain ,  intermittent cough and fevers (tm 99.6) .  She had some loose stools which resolved with Imodium.  She has had anorexia and nausea and has eaten only some saltines yesterday and reports a weight loss of 6 lbs this week.  Her husband was seen on Dec 10 with a 5 day history of similar symptoms ; COVID and FLU tests were negative;  he  is slowly improving.    She has a history of excessive weight loss in the past with Ozempic   and has recently resumed it (prescribed by Endocrinology due to weight regain) . She states that she started the 0.5 mg  dose 3 weeks ago and has not lost any weight until this week    Graves's Disease :  at her last Endocrine visit  in October ,  her methimazole was suspended due to weight gain.  Ozempic  was resumed and she is currently at the 0.5 mg dose   ROS: See pertinent positives and negatives per HPI.  Past Medical History:  Diagnosis Date   Allergy Multiple years   Arthritis 2015   Cancer (HCC)    MULTIPLE SQUAMOUS CELL-WRIST, BUTTOCKS, ABDOMEN, LIP   Chicken pox    Chronic post-traumatic stress disorder (PTSD) 09/05/2017   Complication of anesthesia    PT GETS VERY ANXIOUS PRIOR TO ANESTHESIA AND WILL START JERKING MASK OFF   Depression 1989   Diabetes mellitus without complication (HCC) 2015   Endometriosis    Headache    Hyperlipidemia associated with type 2 diabetes mellitus (HCC)    Lower extremity edema    Skin cancer 2015   Multiple Squamous cell carcinomas; MOH surgery   Tobacco abuse     Past Surgical History:  Procedure Laterality Date   ABDOMINAL HYSTERECTOMY  1990 & 2003   Endometriosis   COLONOSCOPY WITH PROPOFOL  N/A 05/25/2021   Procedure: COLONOSCOPY WITH PROPOFOL ;  Surgeon: Jinny Carmine, MD;  Location: [facility] END[facility]COPY;  Service: Endoscopy;  Laterality: N/A;   COSMETIC SURGERY  2015   Skin cancer on face   CYSTOSCOPY  X3   DIAGNOSTIC LAPAROSCOPY     ESOPHAGOGASTRODUODENOSCOPY (EGD) WITH PROPOFOL  N/A 05/25/2021   Procedure: ESOPHAGOGASTRODUODENOSCOPY (EGD) WITH PROPOFOL ;  Surgeon: Jinny Carmine, MD;  Location: [facility] ENDOSCOPY;  Service: Endoscopy;  Laterality: N/A;   MOHS SURGERY     PAROTIDECTOMY Left 06/20/2015   Procedure: PAROTIDECTOMY;  Surgeon: Deward Argue, MD;  Location: [facility] ORS;  Service: ENT;  Laterality: Left;    Family History  Problem Relation Age of Onset   Arthritis Mother    Hypertension Mother    Breast cancer Sister    Breast cancer Maternal Aunt    Pancreatic cancer Maternal Aunt    Bone cancer Maternal Aunt    Vasculitis Maternal Aunt         Wegener's    Diabetes Mellitus I Maternal Uncle        CAD    Breast cancer Paternal Aunt    Breast cancer Cousin     SOCIAL HX: ***  Current Medications[1]  EXAM:  VITALS per patient if applicable:  GENERAL: alert, oriented, appears well and in no acute distress  HEENT: atraumatic, conjunttiva clear, no obvious abnormalities on inspection of external nose and ears  NECK: normal movements of the head and neck  LUNGS: on inspection no signs of respiratory distress, breathing rate appears normal, no obvious gross SOB, gasping or wheezing  CV: no obvious cyanosis  MS: moves all visible extremities without noticeable abnormality  PSYCH/NEURO: pleasant and cooperative, no obvious depression or anxiety, speech and thought processing grossly intact  ASSESSMENT AND PLAN: There are no diagnoses linked to this encounter.    I discussed the assessment and treatment plan with the patient. The patient was provided an opportunity to ask questions and all were answered. The patient agreed with the plan and demonstrated an understanding of the instructions.   The patient was advised to call back or seek an in-person evaluation if the symptoms worsen or if the condition fails to improve as anticipated.   I spent 30 minutes dedicated to the care of this patient on the date of this encounter to include pre-visit review of patient's medical history,  including recent ER visit, imaging studies and labs, face-to-face time with the patient , and post visit ordering of testing and therapeutics.    Kristie LITTIE Kettering, MD      [1]  Current Outpatient Medications:    atorvastatin  (LIPITOR) 40 MG tablet, TAKE ONE TABLET BY MOUTH ONCE DAILY, Disp: 90 tablet, Rfl: 1   blood glucose meter kit and supplies KIT, Dispense based on patient and insurance preference. Use up to four times daily as directed., Disp: 1 each, Rfl: 11   buPROPion  (WELLBUTRIN  XL) 150 MG 24 hr tablet, Take 2 tablets (300 mg  total) by mouth daily., Disp: 180 tablet, Rfl: 1   diazepam  (VALIUM ) 5 MG tablet, Take 5 mg by mouth at bedtime as needed., Disp: , Rfl:    DULoxetine  (CYMBALTA ) 60 MG capsule, Take 1 capsule (60 mg total) by mouth daily., Disp: 90 capsule, Rfl: 0   furosemide  (LASIX ) 20 MG tablet, Take 1 tablet (20 mg total) by mouth daily., Disp: 90 tablet, Rfl: 3   ondansetron  (ZOFRAN -ODT) 4 MG disintegrating tablet, Take 1 tablet (4 mg total) by mouth every 8 (eight) hours as needed for nausea or vomiting., Disp: 45 tablet, Rfl: 11   OZEMPIC , 0.25 OR 0.5 MG/DOSE, 2 MG/3ML SOPN, 0.5 mg once a week., Disp: , Rfl:    promethazine  (PHENERGAN ) 12.5 MG tablet, Take  1 tablet (12.5 mg total) by mouth every 8 (eight) hours as needed for nausea or vomiting., Disp: 20 tablet, Rfl: 5   spironolactone  (ALDACTONE ) 50 MG tablet, TAKE ONE TABLET BY MOUTH ONCE DAILY, Disp: 90 tablet, Rfl: 1   traMADol  (ULTRAM ) 50 MG tablet, TAKE TWO TABLETS BY MOUTH EVERY 6 HOURS AS NEEDED FOR PAIN, Disp: 240 tablet, Rfl: 0   methimazole (TAPAZOLE) 5 MG tablet, Take 5 mg by mouth daily. (Patient not taking: Reported on 07/17/2024), Disp: , Rfl:

## 2024-07-19 DIAGNOSIS — J019 Acute sinusitis, unspecified: Secondary | ICD-10-CM | POA: Insufficient documentation

## 2024-07-19 NOTE — Assessment & Plan Note (Signed)
 Secondary to Graves Disease.  Methimazole was discontinued by Endocrinology over 6 weeks ago due to weight gain and normal TSH. Repeat TSH needed

## 2024-07-19 NOTE — Assessment & Plan Note (Signed)
 Managed with atorvastatin . LDL is at goal   Lab Results  Component Value Date   CHOL 166 03/19/2024   HDL 97.20 03/19/2024   LDLCALC 55 03/19/2024   LDLDIRECT 52.0 03/19/2024   TRIG 70.0 03/19/2024   CHOLHDL 2 03/19/2024

## 2024-07-19 NOTE — Assessment & Plan Note (Signed)
 Presumed by ultrasound changes and serologies negative for autoimmune causes of hepatitis.  Current liver enzymes are normal except for mildly elevated alk pos ;  all modifiable risk  factors including obesity, diabetes and hyperlipidemia have been addressed . Repeat labs ordered. She has resumed GLP1 agonist therapy , now prescribed by Endocrinology  Fibrosis 4 Score = 1.38 Score is based on outdated labs. ALT, AST, and platelets should all be measured within the last 6 months for an accurate FIB-4 Score  Fib-4 interpretation is not validated for people under 35 or over 85 years of age. However, scores under 2.0 are generally considered low risk.   Lab Results  Component Value Date   ALT 23 03/19/2024   AST 24 03/19/2024   ALKPHOS 150 (H) 03/19/2024   BILITOT 0.7 03/19/2024

## 2024-07-19 NOTE — Assessment & Plan Note (Signed)
 She has Mid cervical degenerative changes most severely affecting C5-C6  where there is mild right and moderately severe left foraminal stenosis. Additionally, there is mild bilateral foraminal stenosis at C6-C7 and mild right lateral recess stenosis at C4-C5.

## 2024-07-19 NOTE — Assessment & Plan Note (Signed)
 CT neck with contrastdone July 2025 was negative for masses.

## 2024-07-19 NOTE — Assessment & Plan Note (Addendum)
 Secondra to disc degeneration at L1-2  which appeared to be  relatively mild without substantial progression since the 2017 MRI. Minor disc degeneration and bulging of the annulus at L4-5 and to a lesser  extent at L3-4. No critical foraminal narrowing. There is moderate to advanced facet arthropathy at L5-S1.   continue use of tramadol  for pain management .  Refill history confirmed via [place] Controlled Substance databas, accessed by me today.SABRA

## 2024-07-19 NOTE — Assessment & Plan Note (Signed)
 Given chronicity of symptoms, development of facial pain and exam consistent with bacterial URI,  Will treat with empiric antibiotics, steroid taper, topical decongestants, and saline lavage.

## 2024-07-22 ENCOUNTER — Ambulatory Visit: Payer: Self-pay

## 2024-07-22 NOTE — Telephone Encounter (Signed)
 FYI Only or Action Required?: Action required by provider: request for appointment and update on patient condition.  Patient was last seen in primary care on 07/17/2024 by Kristie Verneita CROME, MD.  Called Nurse Triage reporting Sinusitis.  Symptoms began a week ago.  Interventions attempted: OTC medications: tylenol /ibuprofen, Prescription medications: azithromycin , and Rest, hydration, or home remedies.  Symptoms are: unchanged.  Triage Disposition: See Physician Within 24 Hours  Patient/caregiver understands and will follow disposition?: No, wishes to speak with PCP   Copied from CRM #[id number]. Topic: Clinical - Red Word Triage >> Jul 22, 2024  8:57 AM Kristie Jones wrote: Red Word that prompted transfer to Nurse Triage:  feel very badly, antibitoics not working   coughing aching, barley get out of bed. no apetite. feel real bad.    flu like symptoms for over a week,   fatigue,   sinus pain ,  intermittent cough and fevers   Was on hold while pt disconnected, called back 2 times no answer. >> Jul 22, 2024  9:38 AM Kristie Jones wrote: coughing aching, barely get out of bed. no appetite. feel real bad. was given prednisone  and zpac - is not working Only wants to see Kristie Jones -- she didn't want to stay on the line >> Jul 22, 2024  9:26 AM Kristie Jones wrote: PT returning call. Called NT but instructed for Pt schedule. Transfer to location  Reason for Disposition  [1] Taking antibiotic > 72 hours (3 days) AND [2] sinus pain not improved  Answer Assessment - Initial Assessment Questions Returned pt's call to f/u on symptoms. Pt states she has taken medication as directed and has not noticed any improvement. States she saw Dr. Marylynn via virtual appt and was told to f/u with her in 1 week if symptoms did not improved. Pt only wants to schedule with PCP, discussed no appts until the end of 08/2024. Reassured pt I would send message to PCP and make her aware. Please advise.     1. ANTIBIOTIC: What  antibiotic are you taking? How many times a day?     Azithromycin  x 7 days   2. ONSET: When was the antibiotic started?     12/12  3. PAIN: How bad is the pain?   (Scale 0-10; or none, mild, moderate or severe)     Pt reports sinus pain and body aches  4. FEVER: Do you have a fever? If Yes, ask: What is it, how was it measured, and when did it start?      Denies fever; states she feels warm but has not checked temperature at this time   5. SYMPTOMS: Are there any other symptoms you're concerned about? If Yes, ask: When did it start?     Coughing, lack of appetite, sinus pain  Protocols used: Sinus Infection on Antibiotic Follow-up Call-[facility]

## 2024-07-22 NOTE — Telephone Encounter (Signed)
 Pt stated that the antibiotics are not working. She stated that she still feels very bad, coughing, aching, can barely get out of bed, no appetite, fatigue, sinus pain and fever.

## 2024-07-22 NOTE — Telephone Encounter (Signed)
 Spoke with pt and she stated that she will go to UC due to no availability in the office.

## 2024-08-11 ENCOUNTER — Other Ambulatory Visit: Payer: Self-pay | Admitting: Internal Medicine

## 2024-08-11 ENCOUNTER — Telehealth: Payer: Self-pay

## 2024-08-11 DIAGNOSIS — Z760 Encounter for issue of repeat prescription: Secondary | ICD-10-CM

## 2024-08-11 NOTE — Telephone Encounter (Signed)
 Copied from CRM #[id number]. Topic: Clinical - Medication Question >> Aug 11, 2024  3:21 PM Mesmerise C wrote: Reason for CRM: Patient checking why appt was cancelled on 2/20 needing refill for her tramadol  and was told needed appt for refills, advised on cancel note stated since she was seen on 12/12 the appt wasn't needed on 2/20 patient wanted to make sure her refill is still going to be approved

## 2024-08-12 NOTE — Telephone Encounter (Signed)
 Spoke with pt to let her know that she is not due for an appt until March. Pt's appt has been scheduled.

## 2024-08-12 NOTE — Telephone Encounter (Signed)
 Refilled: 07/09/2024 Last OV: 07/17/2024 Next OV: not scheduled

## 2024-08-14 ENCOUNTER — Telehealth: Payer: Self-pay

## 2024-08-14 NOTE — Telephone Encounter (Unsigned)
 Copied from CRM #[id number]. Topic: General - Call Back - No Documentation >> Aug 14, 2024  4:23 PM Rea BROCKS wrote: Reason for CRM: Patient did call back. Relayed note about Tramadol  and Duloxetine . Patient is also asking if Dr. Marylynn can include checking her thyroid -labs when she see's her for next appt.   [phone] (M)

## 2024-08-14 NOTE — Telephone Encounter (Signed)
 LMTCB. Please relay message below to pt when she returns the call.

## 2024-08-14 NOTE — Telephone Encounter (Signed)
 Copied from CRM #[id number]. Topic: General - Call Back - No Documentation >> Aug 14, 2024  4:23 PM Rea BROCKS wrote: Reason for CRM: Patient did call back. Relayed note about Tramadol  and Duloxetine . Patient is also asking if Dr. Marylynn can include checking her thyroid -labs when she see's her for next appt.   [phone] (M)

## 2024-08-17 ENCOUNTER — Other Ambulatory Visit: Payer: Self-pay | Admitting: Internal Medicine

## 2024-08-17 DIAGNOSIS — E785 Hyperlipidemia, unspecified: Secondary | ICD-10-CM

## 2024-08-17 DIAGNOSIS — E0590 Thyrotoxicosis, unspecified without thyrotoxic crisis or storm: Secondary | ICD-10-CM

## 2024-08-17 DIAGNOSIS — E1140 Type 2 diabetes mellitus with diabetic neuropathy, unspecified: Secondary | ICD-10-CM

## 2024-08-17 NOTE — Telephone Encounter (Signed)
 Pt would like to know if it is okay if we draw her thyroid  labs at her next appt rather than having endocrinology do them.

## 2024-08-17 NOTE — Telephone Encounter (Signed)
Spoke with pt and scheduled a lab appt.  

## 2024-08-25 ENCOUNTER — Other Ambulatory Visit: Payer: Self-pay | Admitting: Internal Medicine

## 2024-08-28 ENCOUNTER — Other Ambulatory Visit: Payer: Self-pay | Admitting: Internal Medicine

## 2024-08-28 DIAGNOSIS — Z760 Encounter for issue of repeat prescription: Secondary | ICD-10-CM

## 2024-09-07 ENCOUNTER — Other Ambulatory Visit

## 2024-09-25 ENCOUNTER — Ambulatory Visit: Admitting: Internal Medicine

## 2024-09-30 ENCOUNTER — Other Ambulatory Visit

## 2024-10-19 ENCOUNTER — Ambulatory Visit: Admitting: Internal Medicine

## 2024-10-20 ENCOUNTER — Ambulatory Visit
